# Patient Record
Sex: Male | Born: 1955 | Race: White | Hispanic: No | Marital: Married | Smoking: Former smoker
Health system: Southern US, Community
[De-identification: ages and names within clinical notes are randomized; demographics above are authoritative.]

## PROBLEM LIST (undated history)

## (undated) DIAGNOSIS — B1920 Unspecified viral hepatitis C without hepatic coma: Secondary | ICD-10-CM

## (undated) DIAGNOSIS — F1910 Other psychoactive substance abuse, uncomplicated: Secondary | ICD-10-CM

## (undated) DIAGNOSIS — I4891 Unspecified atrial fibrillation: Secondary | ICD-10-CM

## (undated) DIAGNOSIS — K921 Melena: Secondary | ICD-10-CM

## (undated) DIAGNOSIS — M109 Gout, unspecified: Secondary | ICD-10-CM

## (undated) DIAGNOSIS — M47816 Spondylosis without myelopathy or radiculopathy, lumbar region: Secondary | ICD-10-CM

## (undated) DIAGNOSIS — M130 Polyarthritis, unspecified: Secondary | ICD-10-CM

## (undated) DIAGNOSIS — Z9289 Personal history of other medical treatment: Secondary | ICD-10-CM

## (undated) DIAGNOSIS — J45909 Unspecified asthma, uncomplicated: Secondary | ICD-10-CM

## (undated) HISTORY — DX: Unspecified viral hepatitis C without hepatic coma: B19.20

## (undated) HISTORY — DX: Melena: K92.1

## (undated) HISTORY — DX: Unspecified atrial fibrillation: I48.91

## (undated) HISTORY — DX: Personal history of other medical treatment: Z92.89

## (undated) HISTORY — DX: Other psychoactive substance abuse, uncomplicated: F19.10

## (undated) HISTORY — DX: Spondylosis without myelopathy or radiculopathy, lumbar region: M47.816

## (undated) HISTORY — DX: Unspecified asthma, uncomplicated: J45.909

## (undated) HISTORY — DX: Polyarthritis, unspecified: M13.0

## (undated) NOTE — *Deleted (*Deleted)
Primary Care Physician: Alferd Apa, MD Primary Cardiologist: Dr Jens Som Primary Electrophysiologist: none Referring Physician: [facility] triage    Scott Wagner is a 14 y.o. 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  The patient was initially diagnosed with atrial fibrillation in 2010 and underwent DCCV at that time. He has been maintained on flecainide. Patient is not on anticoagulation with a CHADS2VASC score of 0. ***  Today, he denies symptoms of ***palpitations, chest pain, shortness of breath, orthopnea, PND, lower extremity edema, dizziness, presyncope, syncope, snoring, daytime somnolence, bleeding, or neurologic sequela. The patient is tolerating medications without difficulties and is otherwise without complaint today.    Atrial Fibrillation Risk Factors:  he {Action; does/does not:19097} have symptoms or diagnosis of sleep apnea. he {ACTION; IS/IS [id number]} compliant with CPAP therapy. he {Action; does/does not:19097} have a history of rheumatic fever. he {Action; does/does not:19097} have a history of alcohol use. The patient {Action; does/does not:19097} have a history of early familial atrial fibrillation or other arrhythmias.  he has a BMI of There is no height or weight on file to calculate BMI.. There were no vitals filed for this visit.  Family History  Problem Relation Age of Onset  . Heart failure Other        CHF - father and mother. (mother also had severe RA)     Atrial Fibrillation Management history:  Previous antiarrhythmic drugs: flecainide Previous cardioversions: 2010 Previous ablations: none CHADS2VASC score:  Anticoagulation history: warfarin    Past Medical History:  Diagnosis Date  . ATRIAL FIBRILLATION 06/16/2008   Qualifier: Diagnosis of  By: [name], RMA, [name]    .  Atrial fibrillation (HCC)   . Blood in stool   . Cardiomyopathy    improved   . DJD (degenerative joint disease) of lumbar spine   . Gout   . Hepatitis C   . Hx of echocardiogram    a. Echo 12/13:  EF 60-65%, mod LAE, mild RAE, PASP 34  . Intrinsic asthma, unspecified   . Substance abuse (HCC)   . Unspecified polyarthropathy or polyarthritis, multiple sites    Past Surgical History:  Procedure Laterality Date  . HERNIA REPAIR  1966    Current Outpatient Medications  Medication Sig Dispense Refill  . allopurinol (ZYLOPRIM) 100 MG tablet Take 100 mg by mouth 2 (two) times daily.     Marland Kitchen aspirin 325 MG tablet Take 325 mg by mouth daily.    . flecainide (TAMBOCOR) 100 MG tablet Take 1 tablet (100 mg total) by mouth 2 (two) times daily. Call and schedule follow up office visit to receive further refills. Thank you. [phone] 60 tablet 0  . metoprolol tartrate (LOPRESSOR) 25 MG tablet Take 1 tablet by mouth twice daily 102 tablet 0   No current facility-administered medications for this visit.    Allergies  Allergen Reactions  . Indomethacin Other (See Comments)    Headaches and tired    Social History   Socioeconomic History  . Marital status: Married    Spouse name: Not on file  . Number of children: Not on file  . Years of education: Not on file  . Highest education level: Not on file  Occupational History  . Not on file  Tobacco Use  . Smoking status: Former Smoker    Quit date: 02/02/2014    Years since quitting: 5.8  .  Smokeless tobacco: Never Used  . Tobacco comment: started in 1999. smoked 1/4 ppd   Substance and Sexual Activity  . Alcohol use: No    Alcohol/week: 0.0 standard drinks    Comment: quit in 2006   . Drug use: No    Comment: quit in 2006   . Sexual activity: Not on file  Other Topics Concern  . Not on file  Social History Narrative   Married; step children; disabled.    Social Determinants of Health   Financial Resource Strain:   .  Difficulty of Paying Living Expenses: Not on file  Food Insecurity:   . Worried About Programme researcher, broadcasting/film/video in the Last Year: Not on file  . Ran Out of Food in the Last Year: Not on file  Transportation Needs:   . Lack of Transportation (Medical): Not on file  . Lack of Transportation (Non-Medical): Not on file  Physical Activity:   . Days of Exercise per Week: Not on file  . Minutes of Exercise per Session: Not on file  Stress:   . Feeling of Stress : Not on file  Social Connections:   . Frequency of Communication with Friends and Family: Not on file  . Frequency of Social Gatherings with Friends and Family: Not on file  . Attends Religious Services: Not on file  . Active Member of Clubs or Organizations: Not on file  . Attends Banker Meetings: Not on file  . Marital Status: Not on file  Intimate Partner Violence:   . Fear of Current or Ex-Partner: Not on file  . Emotionally Abused: Not on file  . Physically Abused: Not on file  . Sexually Abused: Not on file     ROS- All systems are reviewed and negative except as per the HPI above.  Physical Exam: There were no vitals filed for this visit.  GEN- The patient is well appearing ***, alert and oriented x 3 today.   Head- normocephalic, atraumatic Eyes-  Sclera clear, conjunctiva pink Ears- hearing intact Oropharynx- clear Neck- supple  Lungs- Clear to ausculation bilaterally, normal work of breathing Heart- ***Regular rate and rhythm, no murmurs, rubs or gallops  GI- soft, NT, ND, + BS Extremities- no clubbing, cyanosis, or edema MS- no significant deformity or atrophy Skin- no rash or lesion Psych- euthymic mood, full affect Neuro- strength and sensation are intact  Wt Readings from Last 3 Encounters:  10/12/18 75.4 kg  04/14/17 80.9 kg  03/04/16 78 kg    EKG today demonstrates ***  Echo 03/18/16 demonstrated  - Left ventricle: The cavity size was normal. Wall thickness was  normal. Systolic  function was vigorous. The estimated ejection  fraction was in the range of 65% to 70%. Wall motion was normal;  there were no regional wall motion abnormalities.  - Mitral valve: There was mild regurgitation.  - Left atrium: The atrium was mildly dilated.  - Right ventricle: The cavity size was mildly dilated. Wall  thickness was normal.  - Right atrium: The atrium was mildly dilated.  - Tricuspid valve: There was mild regurgitation.  - Pulmonary arteries: Systolic pressure was mildly increased.   Epic records are reviewed at length today  CHA2DS2-VASc Score = 0  The patient's score is based upon: CHF History: 0 HTN History: 0 Diabetes History: 0 Stroke History: 0 Vascular Disease History: 0 Age Score: 0 Gender Score: 0  {This patient does not have a recorded CHADS2-VASc score.   Click here to calculate  score.   Then REFRESH your note.       :[id number]}    ASSESSMENT AND PLAN: 1. Paroxysmal Atrial Fibrillation (ICD10:  I48.0) The patient's CHA2DS2-VASc score is 0, indicating a 0.2% annual risk of stroke.   ***dccv, anticoagulation, tik, ablation echo   2. Obesity There is no height or weight on file to calculate BMI. Lifestyle modification was discussed at length including regular exercise and weight reduction. ***   Follow up ***   Jorja Loa PA-C [facility] [address] [place], [place] [address] [phone] 11/23/2019 3:47 PM

---
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
## 2004-08-26 ENCOUNTER — Ambulatory Visit: Payer: Self-pay | Admitting: Pain Medicine

## 2006-04-05 ENCOUNTER — Ambulatory Visit: Payer: Self-pay | Admitting: Cardiovascular Disease

## 2006-04-08 ENCOUNTER — Encounter: Payer: Self-pay | Admitting: Cardiology

## 2006-04-08 ENCOUNTER — Ambulatory Visit: Payer: Self-pay | Admitting: Cardiology

## 2006-04-08 ENCOUNTER — Ambulatory Visit: Payer: Self-pay

## 2006-04-28 ENCOUNTER — Ambulatory Visit: Payer: Self-pay | Admitting: Cardiology

## 2006-05-03 ENCOUNTER — Ambulatory Visit: Payer: Self-pay | Admitting: Cardiology

## 2006-05-03 ENCOUNTER — Inpatient Hospital Stay (HOSPITAL_BASED_OUTPATIENT_CLINIC_OR_DEPARTMENT_OTHER): Admission: RE | Admit: 2006-05-03 | Discharge: 2006-05-03 | Payer: Self-pay | Admitting: Cardiology

## 2006-05-19 ENCOUNTER — Ambulatory Visit: Payer: Self-pay | Admitting: Cardiology

## 2006-05-27 ENCOUNTER — Ambulatory Visit: Payer: Self-pay | Admitting: Cardiology

## 2006-05-28 ENCOUNTER — Ambulatory Visit (HOSPITAL_COMMUNITY): Admission: RE | Admit: 2006-05-28 | Discharge: 2006-05-28 | Payer: Self-pay | Admitting: Cardiology

## 2006-05-28 ENCOUNTER — Encounter: Payer: Self-pay | Admitting: Cardiology

## 2006-07-14 ENCOUNTER — Ambulatory Visit: Payer: Self-pay | Admitting: Cardiology

## 2007-03-10 ENCOUNTER — Ambulatory Visit: Payer: Self-pay | Admitting: Cardiology

## 2007-03-10 LAB — CONVERTED CEMR LAB
BUN: 15 mg/dL (ref 6–23)
CO2: 29 meq/L (ref 19–32)
Calcium: 9.2 mg/dL (ref 8.4–10.5)
Chloride: 106 meq/L (ref 96–112)
Creatinine, Ser: 1 mg/dL (ref 0.4–1.5)
GFR calc Af Amer: 101 mL/min
GFR calc non Af Amer: 84 mL/min
Glucose, Bld: 105 mg/dL — ABNORMAL HIGH (ref 70–99)
Potassium: 3.9 meq/L (ref 3.5–5.1)
Sodium: 141 meq/L (ref 135–145)

## 2007-03-28 ENCOUNTER — Ambulatory Visit: Payer: Self-pay

## 2007-03-28 ENCOUNTER — Encounter: Payer: Self-pay | Admitting: Cardiology

## 2007-08-17 ENCOUNTER — Ambulatory Visit: Payer: Self-pay | Admitting: Cardiology

## 2007-08-17 ENCOUNTER — Inpatient Hospital Stay (HOSPITAL_COMMUNITY): Admission: EM | Admit: 2007-08-17 | Discharge: 2007-08-22 | Payer: Self-pay | Admitting: Emergency Medicine

## 2007-08-18 ENCOUNTER — Encounter: Payer: Self-pay | Admitting: Cardiology

## 2007-08-26 ENCOUNTER — Ambulatory Visit: Payer: Self-pay | Admitting: Cardiology

## 2007-08-29 ENCOUNTER — Ambulatory Visit: Payer: Self-pay | Admitting: Cardiology

## 2007-08-29 LAB — CONVERTED CEMR LAB
INR: 1.7 — ABNORMAL HIGH (ref 0.8–1.0)
Prothrombin Time: 18.8 s — ABNORMAL HIGH (ref 10.9–13.3)

## 2007-09-06 ENCOUNTER — Ambulatory Visit: Payer: Self-pay | Admitting: Cardiology

## 2007-09-15 ENCOUNTER — Ambulatory Visit: Payer: Self-pay | Admitting: Internal Medicine

## 2007-09-22 ENCOUNTER — Ambulatory Visit: Payer: Self-pay | Admitting: Internal Medicine

## 2007-10-06 ENCOUNTER — Ambulatory Visit: Payer: Self-pay | Admitting: Internal Medicine

## 2007-10-06 DIAGNOSIS — I428 Other cardiomyopathies: Secondary | ICD-10-CM | POA: Insufficient documentation

## 2007-10-06 DIAGNOSIS — Z8639 Personal history of other endocrine, nutritional and metabolic disease: Secondary | ICD-10-CM | POA: Insufficient documentation

## 2007-10-06 DIAGNOSIS — Z862 Personal history of diseases of the blood and blood-forming organs and certain disorders involving the immune mechanism: Secondary | ICD-10-CM | POA: Insufficient documentation

## 2007-10-06 DIAGNOSIS — F1910 Other psychoactive substance abuse, uncomplicated: Secondary | ICD-10-CM | POA: Insufficient documentation

## 2007-10-07 ENCOUNTER — Ambulatory Visit: Payer: Self-pay | Admitting: Pulmonary Disease

## 2007-10-07 DIAGNOSIS — M479 Spondylosis, unspecified: Secondary | ICD-10-CM | POA: Insufficient documentation

## 2007-10-07 DIAGNOSIS — B1710 Acute hepatitis C without hepatic coma: Secondary | ICD-10-CM | POA: Insufficient documentation

## 2007-10-07 DIAGNOSIS — R0602 Shortness of breath: Secondary | ICD-10-CM | POA: Insufficient documentation

## 2007-10-31 ENCOUNTER — Ambulatory Visit: Payer: Self-pay | Admitting: Pulmonary Disease

## 2007-11-11 ENCOUNTER — Telehealth (INDEPENDENT_AMBULATORY_CARE_PROVIDER_SITE_OTHER): Payer: Self-pay | Admitting: *Deleted

## 2007-11-21 ENCOUNTER — Encounter (INDEPENDENT_AMBULATORY_CARE_PROVIDER_SITE_OTHER): Payer: Self-pay | Admitting: *Deleted

## 2007-12-07 ENCOUNTER — Ambulatory Visit: Payer: Self-pay | Admitting: Pulmonary Disease

## 2007-12-07 DIAGNOSIS — J45909 Unspecified asthma, uncomplicated: Secondary | ICD-10-CM | POA: Insufficient documentation

## 2007-12-14 LAB — CONVERTED CEMR LAB: A-1 Antitrypsin, Ser: 145 mg/dL (ref 83–200)

## 2007-12-16 ENCOUNTER — Ambulatory Visit: Payer: Self-pay | Admitting: Cardiology

## 2007-12-16 ENCOUNTER — Encounter (INDEPENDENT_AMBULATORY_CARE_PROVIDER_SITE_OTHER): Payer: Self-pay | Admitting: *Deleted

## 2008-01-04 ENCOUNTER — Encounter (INDEPENDENT_AMBULATORY_CARE_PROVIDER_SITE_OTHER): Payer: Self-pay | Admitting: *Deleted

## 2008-06-16 DIAGNOSIS — I4891 Unspecified atrial fibrillation: Secondary | ICD-10-CM

## 2008-06-16 DIAGNOSIS — K921 Melena: Secondary | ICD-10-CM | POA: Insufficient documentation

## 2008-06-16 DIAGNOSIS — M130 Polyarthritis, unspecified: Secondary | ICD-10-CM | POA: Insufficient documentation

## 2008-06-16 HISTORY — DX: Unspecified atrial fibrillation: I48.91

## 2008-07-13 ENCOUNTER — Encounter (INDEPENDENT_AMBULATORY_CARE_PROVIDER_SITE_OTHER): Payer: Self-pay | Admitting: *Deleted

## 2008-09-14 ENCOUNTER — Ambulatory Visit: Payer: Self-pay | Admitting: Cardiovascular Disease

## 2008-09-14 ENCOUNTER — Inpatient Hospital Stay (HOSPITAL_COMMUNITY): Admission: EM | Admit: 2008-09-14 | Discharge: 2008-09-18 | Payer: Self-pay | Admitting: Emergency Medicine

## 2008-09-16 ENCOUNTER — Encounter: Payer: Self-pay | Admitting: Cardiovascular Disease

## 2008-09-19 ENCOUNTER — Ambulatory Visit: Payer: Self-pay | Admitting: Cardiology

## 2008-09-19 ENCOUNTER — Ambulatory Visit: Payer: Self-pay

## 2008-09-20 ENCOUNTER — Encounter: Payer: Self-pay | Admitting: Cardiology

## 2008-09-20 LAB — CONVERTED CEMR LAB: INR: 1.3

## 2008-09-24 ENCOUNTER — Ambulatory Visit: Payer: Self-pay | Admitting: Cardiovascular Disease

## 2008-09-24 LAB — CONVERTED CEMR LAB
INR: 1.3 — ABNORMAL HIGH (ref 0.8–1.0)
POC INR: 2.7
Prothrombin Time: 13.7 s — ABNORMAL HIGH (ref 9.1–11.7)

## 2008-10-01 ENCOUNTER — Ambulatory Visit: Payer: Self-pay | Admitting: Cardiovascular Disease

## 2008-10-01 LAB — CONVERTED CEMR LAB: POC INR: 2.3

## 2008-10-19 ENCOUNTER — Ambulatory Visit: Payer: Self-pay | Admitting: Cardiology

## 2008-10-19 DIAGNOSIS — F17200 Nicotine dependence, unspecified, uncomplicated: Secondary | ICD-10-CM | POA: Insufficient documentation

## 2008-10-19 DIAGNOSIS — I498 Other specified cardiac arrhythmias: Secondary | ICD-10-CM | POA: Insufficient documentation

## 2008-10-22 ENCOUNTER — Telehealth (INDEPENDENT_AMBULATORY_CARE_PROVIDER_SITE_OTHER): Payer: Self-pay | Admitting: *Deleted

## 2008-10-29 ENCOUNTER — Encounter: Payer: Self-pay | Admitting: Cardiology

## 2008-12-27 ENCOUNTER — Emergency Department (HOSPITAL_COMMUNITY): Admission: EM | Admit: 2008-12-27 | Discharge: 2008-12-27 | Payer: Self-pay | Admitting: Emergency Medicine

## 2008-12-28 ENCOUNTER — Telehealth: Payer: Self-pay | Admitting: Cardiology

## 2009-02-15 ENCOUNTER — Encounter (INDEPENDENT_AMBULATORY_CARE_PROVIDER_SITE_OTHER): Payer: Self-pay | Admitting: *Deleted

## 2009-06-20 ENCOUNTER — Ambulatory Visit: Payer: Self-pay | Admitting: Cardiology

## 2009-06-20 DIAGNOSIS — N259 Disorder resulting from impaired renal tubular function, unspecified: Secondary | ICD-10-CM | POA: Insufficient documentation

## 2009-07-19 ENCOUNTER — Encounter (INDEPENDENT_AMBULATORY_CARE_PROVIDER_SITE_OTHER): Payer: Self-pay | Admitting: *Deleted

## 2009-07-19 LAB — CONVERTED CEMR LAB
BUN: 20 mg/dL (ref 6–23)
CO2: 24 meq/L (ref 19–32)
Calcium: 9.3 mg/dL (ref 8.4–10.5)
Chloride: 106 meq/L (ref 96–112)
Creatinine, Ser: 1.2 mg/dL (ref 0.4–1.5)
GFR calc non Af Amer: 69.69 mL/min (ref >60)
Glucose, Bld: 85 mg/dL (ref 70–99)
Potassium: 4.3 meq/L (ref 3.5–5.1)
Sodium: 142 meq/L (ref 135–145)

## 2009-10-20 ENCOUNTER — Emergency Department (HOSPITAL_COMMUNITY): Admission: EM | Admit: 2009-10-20 | Discharge: 2009-10-20 | Payer: Self-pay | Admitting: Family Medicine

## 2010-03-04 NOTE — Letter (Signed)
Summary: Appointment - Reminder 2  [employer], Main Office  [address]   [place], [place] [address]   Phone: [phone]  Fax: [phone]     February 15, 2009 MRN: [id number]   ORESTE MAJEED [address] [place], [place]  [address]   Dear Mr. ROESLER,  Our records indicate that it is time to schedule a follow-up appointment. Dr.Crenshaw recommended that you follow up with [place] in March,2011. It is very important that we reach you to schedule this appointment. We look forward to participating in your health care needs. Please contact us at the number listed above at your earliest convenience to schedule your appointment.  If you are unable to make an appointment at this time, give [place] a call so we can update our records.     Sincerely,   Glass blower/designer

## 2010-03-04 NOTE — Letter (Signed)
Summary: Generic Letter  [facility] [employer], Main Office  [address]   [place], [place] [address]   Phone: [phone]  Fax: [phone]        July 19, 2009 MRN: [id number]    Scott Wagner [address] [place], [place]  [address]    Dear [name],     Just wanted to let you know the blood work we checked was normal. We checked your sodium, potassium and kidney function and they were all normal. Please call with any questions or concerns.   Sincerely,  Deliah Goody, RN/Dr Olga Millers

## 2010-03-04 NOTE — Assessment & Plan Note (Signed)
Summary: F6M/DM   Referring Provider:  Olga Millers   History of Present Illness: Scott Wagner is a pleasant gentleman who has a history of atrial fibrillation.  He had a previous nonischemic cardiomyopathy that improved with his followup echocardiogram. Prior catheterization on March 31 of 2008 showed normal coronary arteries and an ejection fraction of 50%. Last episode of atrial fibrillation occurred in August 2010.  Echocardiogram on September 16, 2008 showed an ejection fraction of 50-55% with mild left atrial enlargement. A TSH was normal. He ultimately underwent cardioversion successfully to sinus rhythm. He was also placed on flecainide. He had an outpatient exercise treadmill that showed no exercise-induced arrhythmias. I last saw him in Sept 2010. Since then, she is here had an episode of atrial fibrillation over Thanksgiving. He was seen in the emergency room and given an additional dose of flecainide by his report and converted to sinus rhythm. Since then he denies any dyspnea, chest pain, palpitations, syncope.   Current Medications (verified): 1)  Metoprolol Succinate 25 Mg Xr24h-Tab (Metoprolol Succinate) .... Take One Half Tablet By Mouth Daily 2)  Flecainide Acetate 100 Mg Tabs (Flecainide Acetate) .Marland Kitchen.. 1 Tab Two Times A Day 3)  Aspir-Low 81 Mg Tbec (Aspirin) .Marland Kitchen.. 1 Tab Daily  Allergies: No Known Drug Allergies  Past History:  Past Medical History: ATRIAL FIBRILLATION (ICD-427.31) HEMATOCHEZIA (ICD-578.1) POLYARTHRITIS, INFLAMMATORY (ICD-716.59) INTRINSIC ASTHMA, UNSPECIFIED (ICD-493.10) DEGENERATIVE JOINT DISEASE, LUMBAR SPINE (ICD-721.90) HEPATITIS C (ICD-070.51) SUBSTANCE ABUSE (ICD-305.90) CARDIOMYOPATHY (ICD-425.4) improved    Past Surgical History: Reviewed history from 06/16/2008 and no changes required. hernia - 1966  Social History: Reviewed history from 10/07/2007 and no changes required. Current smoker.  Started in 1999.  Smoked 1/4 ppd Married Step  children Disabled Quit ETOH and drugs in 2006  Review of Systems       no fevers or chills, productive cough, hemoptysis, dysphasia, odynophagia, melena, hematochezia, dysuria, hematuria, rash, seizure activity, orthopnea, PND, pedal edema, claudication. Remaining systems are negative.   Vital Signs:  Patient profile:   55 year old male Height:      74 inches Weight:      187 pounds BMI:     24.10 Pulse rate:   54 / minute Resp:     14 per minute BP sitting:   104 / 64  (left arm)  Vitals Entered By: Kem Parkinson (Jun 20, 2009 2:34 PM)  Physical Exam  General:  Well-developed well-nourished in no acute distress.  Skin is warm and dry.  HEENT is normal.  Neck is supple. No thyromegaly.  Chest is clear to auscultation with normal expansion.  Cardiovascular exam is regular rate and rhythm.  Abdominal exam nontender or distended. No masses palpated. Extremities show no edema. neuro grossly intact    EKG  Procedure date:  06/20/2009  Findings:      Sinus rhythm at a rate of 60. Occasional PVC. No ST changes.  Impression & Recommendations:  Problem # 1:  ATRIAL FIBRILLATION (ICD-427.31) Patient remains in sinus rhythm today. Continue aspirin as he has no embolic risk factors. Continue flecainide 100 mg p.o. b.i.d. Continue low-dose Toprol. His updated medication list for this problem includes:    Metoprolol Succinate 25 Mg Xr24h-tab (Metoprolol succinate) .Marland Kitchen... Take one half tablet by mouth daily    Flecainide Acetate 100 Mg Tabs (Flecainide acetate) .Marland Kitchen... 1 tab two times a day    Aspir-low 81 Mg Tbec (Aspirin) .Marland Kitchen... 1 tab daily  Problem # 2:  RENAL INSUFFICIENCY (ICD-588.9) Creatinine increased in  November. Repeat bmet today.  Problem # 3:  TOBACCO ABUSE (ICD-305.1) Patient counseled on discontinuing.  Problem # 4:  CARDIOMYOPATHY (ICD-425.4) Improved almost recent echocardiogram and felt to be tachycardia mediated. I will repeat echocardiogram in one year  when he returns. His updated medication list for this problem includes:    Metoprolol Succinate 25 Mg Xr24h-tab (Metoprolol succinate) .Marland Kitchen... Take one half tablet by mouth daily    Flecainide Acetate 100 Mg Tabs (Flecainide acetate) .Marland Kitchen... 1 tab two times a day    Aspir-low 81 Mg Tbec (Aspirin) .Marland Kitchen... 1 tab daily  Other Orders: TLB-BMP (Basic Metabolic Panel-BMET) (80048-METABOL)  Patient Instructions: 1)  Your physician recommends that you schedule a follow-up appointment in: ONE YEAR

## 2010-05-07 LAB — CBC
HCT: 42.2 % (ref 39.0–52.0)
Hemoglobin: 14.7 g/dL (ref 13.0–17.0)
MCHC: 34.8 g/dL (ref 30.0–36.0)
MCV: 88.5 fL (ref 78.0–100.0)
Platelets: 232 10*3/uL (ref 150–400)
RBC: 4.77 MIL/uL (ref 4.22–5.81)
RDW: 14.4 % (ref 11.5–15.5)
WBC: 9 10*3/uL (ref 4.0–10.5)

## 2010-05-07 LAB — BASIC METABOLIC PANEL
BUN: 13 mg/dL (ref 6–23)
CO2: 24 mEq/L (ref 19–32)
Calcium: 8.9 mg/dL (ref 8.4–10.5)
Chloride: 108 mEq/L (ref 96–112)
Creatinine, Ser: 1.72 mg/dL — ABNORMAL HIGH (ref 0.4–1.5)
GFR calc Af Amer: 51 mL/min — ABNORMAL LOW (ref >60)
GFR calc non Af Amer: 42 mL/min — ABNORMAL LOW (ref >60)
Glucose, Bld: 125 mg/dL — ABNORMAL HIGH (ref 70–99)
Potassium: 3.7 mEq/L (ref 3.5–5.1)
Sodium: 141 mEq/L (ref 135–145)

## 2010-05-07 LAB — CK TOTAL AND CKMB (NOT AT ARMC)
CK, MB: 1.9 ng/mL (ref 0.3–4.0)
Relative Index: INVALID (ref 0.0–2.5)
Total CK: 77 U/L (ref 7–232)

## 2010-05-07 LAB — TROPONIN I: Troponin I: 0.02 ng/mL (ref 0.00–0.06)

## 2010-05-10 LAB — CBC
HCT: 34.4 % — ABNORMAL LOW (ref 39.0–52.0)
HCT: 35.8 % — ABNORMAL LOW (ref 39.0–52.0)
HCT: 41.1 % (ref 39.0–52.0)
HCT: 42.9 % (ref 39.0–52.0)
HCT: 45.9 % (ref 39.0–52.0)
Hemoglobin: 11.8 g/dL — ABNORMAL LOW (ref 13.0–17.0)
Hemoglobin: 12.3 g/dL — ABNORMAL LOW (ref 13.0–17.0)
Hemoglobin: 14 g/dL (ref 13.0–17.0)
Hemoglobin: 14.8 g/dL (ref 13.0–17.0)
Hemoglobin: 15.9 g/dL (ref 13.0–17.0)
MCHC: 34.1 g/dL (ref 30.0–36.0)
MCHC: 34.3 g/dL (ref 30.0–36.0)
MCHC: 34.4 g/dL (ref 30.0–36.0)
MCHC: 34.4 g/dL (ref 30.0–36.0)
MCHC: 34.6 g/dL (ref 30.0–36.0)
MCV: 88.1 fL (ref 78.0–100.0)
MCV: 88.7 fL (ref 78.0–100.0)
MCV: 89 fL (ref 78.0–100.0)
MCV: 87.7 fL (ref 78.0–100.0)
MCV: 88.1 fL (ref 78.0–100.0)
Platelets: 171 10*3/uL (ref 150–400)
Platelets: 183 10*3/uL (ref 150–400)
Platelets: 227 10*3/uL (ref 150–400)
Platelets: 229 10*3/uL (ref 150–400)
Platelets: 262 10*3/uL (ref 150–400)
RBC: 3.91 MIL/uL — ABNORMAL LOW (ref 4.22–5.81)
RBC: 4.02 MIL/uL — ABNORMAL LOW (ref 4.22–5.81)
RBC: 4.64 MIL/uL (ref 4.22–5.81)
RBC: 4.89 MIL/uL (ref 4.22–5.81)
RBC: 5.21 MIL/uL (ref 4.22–5.81)
RDW: 13.3 % (ref 11.5–15.5)
RDW: 13.4 % (ref 11.5–15.5)
RDW: 13.7 % (ref 11.5–15.5)
RDW: 13.2 % (ref 11.5–15.5)
RDW: 13.4 % (ref 11.5–15.5)
WBC: 5.6 10*3/uL (ref 4.0–10.5)
WBC: 6.3 10*3/uL (ref 4.0–10.5)
WBC: 7.6 10*3/uL (ref 4.0–10.5)
WBC: 10 10*3/uL (ref 4.0–10.5)
WBC: 8.2 10*3/uL (ref 4.0–10.5)

## 2010-05-10 LAB — CARDIAC PANEL(CRET KIN+CKTOT+MB+TROPI)
CK, MB: 1.9 ng/mL (ref 0.3–4.0)
Relative Index: INVALID (ref 0.0–2.5)
Total CK: 62 U/L (ref 7–232)
Troponin I: 0.11 ng/mL — ABNORMAL HIGH (ref 0.00–0.06)

## 2010-05-10 LAB — DIFFERENTIAL
Basophils Absolute: 0.2 10*3/uL — ABNORMAL HIGH (ref 0.0–0.1)
Basophils Relative: 2 % — ABNORMAL HIGH (ref 0–1)
Eosinophils Absolute: 0.3 10*3/uL (ref 0.0–0.7)
Eosinophils Relative: 4 % (ref 0–5)
Lymphocytes Relative: 27 % (ref 12–46)
Lymphs Abs: 2.7 10*3/uL (ref 0.7–4.0)
Monocytes Absolute: 0.7 10*3/uL (ref 0.1–1.0)
Monocytes Relative: 7 % (ref 3–12)
Neutro Abs: 6.1 10*3/uL (ref 1.7–7.7)
Neutrophils Relative %: 61 % (ref 43–77)

## 2010-05-10 LAB — POCT CARDIAC MARKERS
CKMB, poc: <1 ng/mL — ABNORMAL LOW (ref 1.0–8.0)
CKMB, poc: <1 ng/mL — ABNORMAL LOW (ref 1.0–8.0)
Myoglobin, poc: 74.2 ng/mL (ref 12–200)
Myoglobin, poc: 88.7 ng/mL (ref 12–200)
Troponin i, poc: <0.05 ng/mL (ref 0.00–0.09)
Troponin i, poc: <0.05 ng/mL (ref 0.00–0.09)

## 2010-05-10 LAB — LIPID PANEL
Cholesterol: 170 mg/dL (ref 0–200)
HDL: 38 mg/dL — ABNORMAL LOW (ref >39)
LDL Cholesterol: 117 mg/dL — ABNORMAL HIGH (ref 0–99)
Total CHOL/HDL Ratio: 4.5 RATIO
Triglycerides: 77 mg/dL (ref <150)
VLDL: 15 mg/dL (ref 0–40)

## 2010-05-10 LAB — URINALYSIS, ROUTINE W REFLEX MICROSCOPIC
Bilirubin Urine: NEGATIVE
Glucose, UA: NEGATIVE mg/dL
Hgb urine dipstick: NEGATIVE
Ketones, ur: NEGATIVE mg/dL
Nitrite: NEGATIVE
Protein, ur: NEGATIVE mg/dL
Specific Gravity, Urine: 1.018 (ref 1.005–1.030)
Urobilinogen, UA: 1 mg/dL (ref 0.0–1.0)
pH: 6 (ref 5.0–8.0)

## 2010-05-10 LAB — URINE MICROSCOPIC-ADD ON

## 2010-05-10 LAB — CK TOTAL AND CKMB (NOT AT ARMC)
CK, MB: 2.3 ng/mL (ref 0.3–4.0)
Relative Index: INVALID (ref 0.0–2.5)
Total CK: 82 U/L (ref 7–232)

## 2010-05-10 LAB — COMPREHENSIVE METABOLIC PANEL
ALT: 25 U/L (ref 0–53)
AST: 24 U/L (ref 0–37)
Albumin: 3.7 g/dL (ref 3.5–5.2)
Alkaline Phosphatase: 64 U/L (ref 39–117)
BUN: 10 mg/dL (ref 6–23)
CO2: 24 mEq/L (ref 19–32)
Calcium: 9.1 mg/dL (ref 8.4–10.5)
Chloride: 109 mEq/L (ref 96–112)
Creatinine, Ser: 1.11 mg/dL (ref 0.4–1.5)
GFR calc Af Amer: >60 mL/min (ref >60)
GFR calc non Af Amer: >60 mL/min (ref >60)
Glucose, Bld: 111 mg/dL — ABNORMAL HIGH (ref 70–99)
Potassium: 4.2 mEq/L (ref 3.5–5.1)
Sodium: 140 mEq/L (ref 135–145)
Total Bilirubin: 1 mg/dL (ref 0.3–1.2)
Total Protein: 7.2 g/dL (ref 6.0–8.3)

## 2010-05-10 LAB — PROTIME-INR
INR: 1.1 (ref 0.00–1.49)
INR: 1 (ref 0.00–1.49)
Prothrombin Time: 13.7 seconds (ref 11.6–15.2)
Prothrombin Time: 13.4 seconds (ref 11.6–15.2)

## 2010-05-10 LAB — APTT: aPTT: 33 seconds (ref 24–37)

## 2010-05-10 LAB — RAPID URINE DRUG SCREEN, HOSP PERFORMED
Amphetamines: NOT DETECTED
Barbiturates: NOT DETECTED
Benzodiazepines: NOT DETECTED
Cocaine: NOT DETECTED
Opiates: NOT DETECTED
Tetrahydrocannabinol: NOT DETECTED

## 2010-05-10 LAB — TROPONIN I: Troponin I: 0.05 ng/mL (ref 0.00–0.06)

## 2010-05-10 LAB — TSH: TSH: 1.698 u[IU]/mL (ref 0.350–4.500)

## 2010-06-17 NOTE — Assessment & Plan Note (Signed)
[facility]                            CARDIOLOGY OFFICE NOTE   [name], [name]                   MRN:          [id number]  DATE:09/06/2007                            DOB:          12-27-1955    HISTORY OF PRESENT ILLNESS:  [name] returns for followup today.  He has a history of nonischemic cardiomyopathy with atrial fibrillation.  However, his most recent echocardiogram in February 2009 showed that his  LV function had returned to normal.  However, he was recently admitted  to [facility] again in July with recurrent atrial fibrillation.  A TSH was normal.  He did have a repeat echocardiogram on August 18, 2007.  His ejection fraction was felt to be 55%.  He ultimately underwent TEE-  guided cardioversion successfully.  Since then, he has done well.  He  denies any dyspnea, chest pain, palpitations, or syncope.  There is no  pedal edema.   MEDICATIONS:  1. Metoprolol 50 mg p.o. b.i.d.  2. Coumadin as directed.   PHYSICAL EXAMINATION:  VITAL SIGNS:  Shows a blood pressure of 130/80,  and his pulse is 44.  He weighs 190 pounds.  HEENT:  Normal.  NECK:  Supple.  CHEST:  Clear.  CARDIOVASCULAR:  Reveals bradycardic rate, but a regular rhythm.  ABDOMEN:  Shows no tenderness.  EXTREMITIES:  Show no edema.   His electrocardiogram shows a sinus bradycardia at rate of 44.  There  are no ST changes noted.   DIAGNOSES:  1. Paroxysmal atrial fibrillation - the patient remains in sinus      rhythm following his cardioversion.  Note his digoxin was      discontinued in the hospital, as he was bradycardic and his beta-      blocker was also decreased.  He continues to be somewhat      bradycardic, but he is asymptomatic with this.  We will therefore      make no changes in the dose of his Lopressor for now.  If he has      recurrent atrial fibrillation in the future then he may require an      antiarrhythmic such as flecainide.  He  will continue on his      Coumadin for now.  He does have a history of noncompliance, but he      is now following up in our Coumadin Clinic to have his INRs      checked.  We will most likely discontinue this in the future and      resume his aspirin as I am concerned about his long term      compliance, but given his recent cardioversion.  We will continue      with Coumadin for now.  2. History of nonischemic cardiomyopathy - we felt this was possibly      secondary to ethyl alcohol (consumption, dependency) versus viral      mediated cardiomyopathy.  It also could be tachycardia mediated.      It is improved on his most recent echocardiogram.  3. History of substance abuse.  I will see him back in 3 months.     Madolyn Frieze Jens Som, MD, [facility]  Electronically Signed    BSC/MedQ  DD: 09/06/2007  DT: 09/06/2007  Job #: [id number]   cc:   Gregary Signs A. [name], MD

## 2010-06-17 NOTE — Op Note (Signed)
NAME:  Scott Wagner, BLANN NO.:  [id number]   MEDICAL RECORD NO.:  [id number]          PATIENT TYPE:  INP   LOCATION:  3710                         FACILITY:  [facility]   PHYSICIAN:  Madolyn Frieze. Jens Som, MD, FACCDATE OF BIRTH:  Aug 23, 1955   DATE OF PROCEDURE:  09/17/2008  DATE OF DISCHARGE:                               OPERATIVE REPORT   This is cardioversion of atrial flutter.  The patient was sedated with  propofol 140 mg intravenously by Anesthesia.  Synchronized cardioversion  with 120 J (biphasic) resulted in normal sinus rhythm.  There were no  immediate complications.  We would recommend continuing Lovenox until  INR is therapeutic.  He would then continue Coumadin for 4 weeks  following the procedure.      Madolyn Frieze Jens Som, MD, [facility]  Electronically Signed     BSC/MEDQ  D:  09/17/2008  T:  09/18/2008  Job:  [id number]

## 2010-06-17 NOTE — Discharge Summary (Signed)
NAME:  Scott Wagner, Scott Wagner NO.:  [id number]   MEDICAL RECORD NO.:  [id number]          PATIENT TYPE:  INP   LOCATION:  2020                         FACILITY:  [facility]   PHYSICIAN:  Madolyn Frieze. Jens Som, MD, FACCDATE OF BIRTH:  [birth year]   DATE OF ADMISSION:  08/17/2007  DATE OF DISCHARGE:  08/22/2007                               DISCHARGE SUMMARY   PROCEDURE:  TEE cardioversion.   PRIMARY FINAL DISCHARGE DIAGNOSIS:  Paroxysmal atrial fibrillation.   SECONDARY DIAGNOSES:  1. Nonischemic cardiomyopathy with an ejection fraction of 50%-55% at      transesophageal echocardiography this admission.  2. Status post direct current cardioversion for atrial fibrillation in      April 2008.  3. History of osteoarthritis.  4. Status post cardiac catheterization in March 2008 with normal      coronary arteries.  5. Ongoing tobacco use.   TIME AT DISCHARGE:  35 minutes.   HOSPITAL COURSE:  [name] is a 55 year old male with known history  of coronary artery disease, who has had atrial fibrillation in the past.  He developed palpitations that had started several days prior to  admission.  He came in with an atrial fibrillation.  He was admitted for  further evaluation.   [name] was symptomatic with his atrial fibrillation despite rate  control.  He was scheduled for a TEE cardioversion and this was  performed on August 19, 2007.  His EF was 50%-55%.  He had mild-to-  moderate biatrial enlargement and no left atrial appendage thrombus.  He  had 200 joules biphasic shock and converted to sinus rhythm.   [name] was continued on heparin until his Coumadin level was  therapeutic.  He was therapeutic on August 21, 2007, and remained  therapeutic on August 22, 2007.  His INR at discharge was 2.2.  Once he  had been therapeutic for greater than 24 hours, he was felt appropriate  for discontinuation of heparin.   His heart rate dropped to the 40s at times on metoprolol 75  mg b.i.d.  and digoxin.  The digoxin was discontinued and the metoprolol was  decreased to 50 mg a day.  On August 22, 2007, Dr. Jens Som evaluated Mr.  Raygoza and felt that he was stable for discharge with close outpatient  followup.   DISCHARGE INSTRUCTIONS:  His activity level is to be increased  gradually.  He is to stick to a heart-healthy diet.  He is to follow up  in our office with the Coumadin check on August 26, 2007, at 10:30 and see  Dr. Jens Som on September 06, 2007, at 8:00 a.m.  He is encouraged to obtain  a primary care physician.   DISCHARGE MEDICATIONS:  1. Coumadin 5 mg daily or as directed.  2. Metoprolol 50 mg b.i.d.  3. Tylenol p.r.n.  4. He is not to take digoxin.  5. He is to limit Advil or other NSAIDs.      Theodore Demark, PA-C      Madolyn Frieze. Jens Som, MD, [facility]  Electronically Signed    RB/MEDQ  D:  08/22/2007  T:  08/23/2007  Job:  [id number]

## 2010-06-17 NOTE — H&P (Signed)
NAME:  ZURICH, CARRENO NO.:  [id number]   MEDICAL RECORD NO.:  [id number]          PATIENT TYPE:  INP   LOCATION:  2020                         FACILITY:  [facility]   PHYSICIAN:  Luis Abed, MD, FACCDATE OF BIRTH:  05/15/1955   DATE OF ADMISSION:  08/17/2007  DATE OF DISCHARGE:                              HISTORY & PHYSICAL   PRIMARY CARE PHYSICIAN:  Dr. Dalbert Mayotte, MD in [employer].   PRIMARY CARDIOLOGIST:  Veverly Fells. Excell Seltzer, MD   CHIEF COMPLAINT:  Palpitations.   HISTORY OF PRESENT ILLNESS:  Mr. Laymon is a 55 year old male with a  history of atrial fibrillation but no coronary artery disease.  He had  onset of tachy palpitations approximately 3 days ago, Saturday.  He has  had no chest pain except for 1 or 2 brief stabbing-type pains.  He has  had some bilateral neck pain and a slight headache as well as fatigue  and dyspnea on exertion.  He also complains of episodic shortness of  breath for 1 month.  He states that it starts at rest in his lungs feel  like they are filled with fluids.  He will lie down and his symptoms  will last a few hours.  He feels that he does not cough significantly  when he gets these.  He has been out of Lasix 2 or 3 months but states  these symptoms started before he ran out of his Lasix.  He has not had  any in the last week or so.  Currently, in the emergency room, he is  resting comfortably.   PAST MEDICAL HISTORY:  1. Status post cardiac catheterization in March 2008 showing normal      coronary arteries and an EF of 50%.  2. History of nonischemic cardiomyopathy, felt secondary to EtOH with      an EF in March 2008, 40%.  3. History of paroxysmal atrial fibrillation, status post direct      current cardioversion.  4. History of anticoagulation with Coumadin, discontinued because of      lack of followup and Coumadin checks.  5. Osteoarthritis.   SURGICAL HISTORY:  Status post direct current cardioversion with a  TEE  as well as heart catheterization.   ALLERGIES:  No known drug allergies.   CURRENT MEDICATIONS:  1. Aspirin 325 mg daily.  2. Digoxin 0.25 mg daily.  3. Metoprolol 5 mg b.i.d.   SOCIAL HISTORY:  He lives in [place] with his wife.  He is a disabled  Engineer, maintenance (IT).  He has approximately 5-pack-year history of tobacco  use.  He smokes about 2 cigarettes a day.  Up until the palpitations  started, he was walking between 2 and 5 miles a day.  He quit alcohol  and drugs about 2 years ago.   FAMILY HISTORY:  Both of his parents are deceased; his mother had atrial  fibrillation, and his father had heart failure but no siblings have  coronary artery disease or arrhythmia problems.   REVIEW OF SYSTEMS:  He has remote history of erectile dysfunction.  He  has some dyspnea on exertion  that is chronic.  The palpitations are  described above.  He denies orthopnea, PND, or edema.  He has not had  urinary symptoms.  He has had some fatigue.  He has some chronic  arthralgias and back pain.  Full 14-point review of systems is otherwise  negative.   PHYSICAL EXAMINATION:  VITAL SIGNS:  Temperature is 98.4, blood pressure  131/85, pulse 80, and respiratory rate 20.  GENERAL:  He is a well-developed, well-nourished white male in no acute  distress.  HEENT:  Normal.  NECK:  Normal.  There is no lymphadenopathy, thyromegaly, bruit, or JVD  noted.  CARDIOVASCULAR:  Irregular in rate and rhythm with an S1, S2, and a soft  systolic murmur noted.  LUNGS:  Essentially clear to auscultation bilaterally.  SKIN:  No rashes or lesions are noted.  ABDOMEN:  Soft and nontender with active bowel sounds and no  hepatosplenomegaly is noted.  EXTREMITIES:  Distal pulses are 2+ in all four extremities.  There is no  cyanosis, clubbing or edema noted.  MUSCULOSKELETAL:  There is no joint deformity or effusions.  No spine or  CVA tenderness.  NEUROLOGIC:  He is alert and oriented.  Cranial nerves  II-XII grossly  intact.   Chest x-ray shows a right upper lobe density, question granuloma, chest  CT recommended.   EKG:  AFib, 85 with a occasional PVCs and no acute ischemic changes.   Laboratory values are pending at the time of dictation.   IMPRESSION:  Mr. Herrington was seen today by Dr. Myrtis Ser.  He is symptomatic  from his atrial fibrillation despite adequate rate control.  He will be  admitted.  He will be started on heparin with a plan to transition him  to Coumadin.  He will be continued on his other home medications.  In  discussing the options with the patient, he prefers to get out of atrial  fibrillation as soon as possible.  Therefore, he will be made n.p.o.  after midnight, and we will try to arrange a TEE cardioversion in the  a.m.  Mr. Dolberry understands that he will have to be compliant with  his Coumadin and keep followup appointment to get it checked.  Case  manager will be asked to see him to help with financial arrangements as  he is extremely worried about these and to see if outpatient Lovenox as  a possibility.  Because of his history of nonischemic cardiomyopathy, we  will check an echocardiogram, but because he had a heart catheterization  a little over a year ago and has not had significant chest pain, no  ischemic workup is planned.      Theodore Demark, PA-C      Luis Abed, MD, [facility]  Electronically Signed    RB/MEDQ  D:  08/17/2007  T:  08/18/2007  Job:  [id number]   cc:   Dalbert Mayotte, M.D.

## 2010-06-17 NOTE — Assessment & Plan Note (Signed)
[facility]                            CARDIOLOGY OFFICE NOTE   PER, BEAGLEY                     MRN:          [id number]  DATE:07/14/2006                            DOB:          [birth year]    Mr. Laster returns for followup today. He has a history of atrial  fibrillation as well as non ischemic cardiomyopathy. Please refer to my  previous notes for details. He did undergo cardiac catheterization on  May 03, 2006. He had no significant coronary disease and his ejection  fraction was felt to be 50%. He subsequently underwent cardioversion on  May 28, 2006 following a transesophageal echocardiogram that showed no  thrombus. Note his ejection fraction at that time was 45%. There was  mild-to-moderate mitral regurgitation. Since being cardioverted he has  felt well. He denies any dyspnea, chest pain, pedal edema, or syncope.  He occasionally has minimal palpitations but nothing sustained. His  medications at present include;  1. Coumadin 5 mg p.o. daily.  2. Lopressor 150 mg p.o. b.i.d.  3. Digoxin 0.25 mg p.o. daily.  4. Lisinopril 5 mg p.o. daily.  5. Lasix 20 mg p.o. daily.   His physical exam today shows blood pressure of 116/69, and his pulse is  55. He weighs 117 pounds. He is well-developed and well-nourished.  HEENT: Normal.  NECK: Supple.  CHEST: Clear.  CARDIOVASCULAR EXAM: Reveals a regular rate and rhythm.  EXTREMITIES: Show no edema.   His electrocardiogram shows a sinus rhythm at a rate of 62. The axis is  normal. There are no significant ST-T changes.   DIAGNOSES:  1. Atrial fibrillation- the patient is status post cardioversion and      is maintaining sinus rhythm. We will continue with his beta blocker      and digoxin for rate control if his atrial fibrillation recurs. He      apparently has not had his Coumadin checked by Dr. Drue Second since      April 24th. I did discuss this with her today and he has had  multiple phone calls explaining that he needed to have his INR      checked. Given his history of noncompliance and question of drug      use, we feel that it is too high risk to continue with his      Coumadin. We feel the risk outweighs the benefit. We therefore      would like to discontinue his Coumadin. I have explained that he is      at a higher risk of an embolic event if his atrial fibrillation      recurs. Instead we will begin enteric coated aspirin 325 mg p.o.      daily. If he demonstrates better compliance in the future then we      could reconsider this.  2. Non ischemic cardiomyopathy- the etiology of this remained unclear.      It may have been tachycardia-mediated related to his previous      atrial fibrillation versus his history of ETOH. He also had a URI  back in the winter and it may have been virally mediated.      Regardless we will continue with his lisinopril, digoxin, and      Lasix. Note we will discontinue his Lopressor and begin Toprol 200      mg p.o. daily. I will check a BMET today to follow his potassium      and renal function.  3. History of mildly increased liver functions- this may be related to      his history of alcohol abuse. We will plan to repeat those and if      they remain elevated he may need a gastrointestinal evaluation.  4. History of substance abuse- the patient states that he is not using      any substances at this point.  5. Coumadin therapy- we will discontinue this as outlined above.   We will see him back in 4 months.     Madolyn Frieze Jens Som, MD, [facility]  Electronically Signed    BSC/MedQ  DD: 07/14/2006  DT: 07/14/2006  Job #: [id number]   cc:   Dalbert Mayotte, M.D.

## 2010-06-17 NOTE — Assessment & Plan Note (Signed)
[facility]                            CARDIOLOGY OFFICE NOTE   Scott Wagner, Scott Wagner                   MRN:          [id number]  DATE:12/16/2007                            DOB:          06/29/1955    Scott Wagner is a pleasant gentleman who has a history of atrial  fibrillation.  He had a previous nonischemic cardiomyopathy that  improved with his followup echocardiogram.  His most recent missions to  [facility] was in July and he had recurrent atrial fibrillation  at that time.  TSH was normal.  An echocardiogram on August 18, 2007,  showed an ejection fraction of 55% and he ultimately had TE guided  cardioversion successfully.  I last saw him on September 06, 2007.  Since  then he has been diagnosed with asthma.  However, he denies any dyspnea  on exertion, orthopnea, PND, pedal edema, presyncope, syncope,  exertional chest pain.  He occasionally feels his heart skip but this  is not sustained.  He did discontinue his Coumadin as he apparently was  having hematochezia.  He has been seen by gastroenterologist in  [place] and a colonoscopy was recommended, but he states he is  unable to afford this.   His medications include metoprolol 50 mg p.o. b.i.d. and aspirin 325 mg  daily.  He also takes Advair.   PHYSICAL EXAMINATION:  VITAL SIGNS:  Today shows a blood pressure 138/85  and his pulse is 48.  He weighs 197 pounds.  HEENT:  Normal.  NECK:  Supple.  CHEST:  Clear.  CARDIOVASCULAR:  Bradycardic rate, but regular rhythm.  ABDOMINAL:  No tenderness.  EXTREMITIES:  No edema.   Electrocardiogram shows a sinus rhythm at a rate of 51.  There are no  significant ST changes.   DIAGNOSES:  1. Paroxysmal atrial fibrillation - the patient remains in sinus      rhythm today.  We will continue with his Lopressor and his aspirin.      If he develops recurrent atrial fibrillation in the future, then he      may require an antiarrhythmic such  as flecainide.  Note, previous      catheterization showed no coronary artery disease.  We will      continue off of his Coumadin for now particularly in light of his      hematochezia.  Also, his left ventricular function has returned to      normal, and his CHADS II score is 0.  There is also a history of      compliance issues and we will therefore most likely continue with      aspirin.  2. History of nonischemic cardiomyopathy - improved on most recent      echocardiogram - this was felt previously to be possibly secondary      to EtOH versus a viral cardiomyopathy.  It also may have been      tachycardia mediated due to atrial fibrillation.  Again, this is      improved.  3. History of substance abuse - he denies any substance abuse  recently.  4. Hematochezia - this is being evaluated by Gastroenterology in      [place].  I have encouraged him to follow up with him      concerning this issue.   I will see him back in 9 months.     Madolyn Frieze Scott Som, MD, [facility]  Electronically Signed    BSC/MedQ  DD: 12/16/2007  DT: 12/17/2007  Job #: [id number]

## 2010-06-17 NOTE — Assessment & Plan Note (Signed)
[facility]   NAME:  Scott Wagner, Scott Wagner            ACCOUNT NO.:  [id number]   MEDICAL RECORD NO.:  [id number]      DATE OF BIRTH:  05/10/1955   PHYSICIAN:  Noralyn Pick. Eden Emms, MD, [facility]    VISIT DATE:                                   OFFICE VISIT   A 52-year patient with recurrent AFib.  This is a TEE cardioversion.   The patient was sedated with 125 mcg of fentanyl and 10 mg of Versed.   Using digital technique, an OmniPlane probe was advanced into the  esophagus without incident.  There was mild left ventricular cavity  enlargement with low-normal ejection fraction of 50-55%.  Mitral valve  was mildly thickened with mild MR.  There was mild-to-moderate biatrial  enlargement.  The atrial septum was intact with no ASD.  There was mild  TR.  Aortic valve was trileaflet and normal.  Left atrial appendage was  moderately dilated.  No spontaneous contrast or clot.   Since the patient has no left atrial appendage clot, we proceeded with  cardioversion.  We had good generalized sedation.  He was cardioverted  with a single 200-joule biphasic shock.  He converted from atrial  fibrillation at a rate of 88 to sinus rhythm at a rate of 60.   The patient was on therapeutic heparin at the time of the cardioversion  and will remain on this until his INR is greater than 2.2 for 48 hours.   IMPRESSION:  Successful TEE-guided cardioversion.      Noralyn Pick. Eden Emms, MD, [facility]  Electronically Signed     PCN/MEDQ  D:  08/19/2007  T:  08/20/2007  Job:  [id number]

## 2010-06-17 NOTE — Assessment & Plan Note (Signed)
[facility]                            CARDIOLOGY OFFICE NOTE   Scott, Wagner                     MRN:          [id number]  DATE:03/10/2007                            DOB:          12-Feb-1955    [name] returns followup today.  Has a history of a mild muscular  cardiomyopathy atrial fibrillation.  He had a previous cardioversion.  Since I last saw him he denies any dyspnea on exertion, orthopnea, PND,  pedal edema, palpitations, presyncope, syncope or chest pain.  He states  he has not used alcohol or drugs.  His medications include digoxin 0.25  mg daily, lisinopril 5 mg daily, Lasix 20 mg daily, Toprol 200 mg daily  and aspirin 325 mg daily.   PHYSICAL EXAM:  Shows a blood pressure of 128/80.  His pulse is 51.  Weighs 187 pounds.  HEENT:  Normal.  Neck is supple.  CHEST:  Clear.  CARDIOVASCULAR:  Bradycardic rate and regular rhythm.  ABDOMEN:  Benign.  EXTREMITIES:  Show no edema.   Electrocardiogram shows sinus rhythm at rate of 51.  There are no  significant ST changes.   DIAGNOSES:  1. Atrial fibrillation status post cardioversion - the patient is      maintaining sinus rhythm on his home.  He will continue on digoxin      and Toprol in case his atrial fibrillation recurs.  We have taken      off Coumadin previously as he has a history of noncompliance,      possible substance abuse.  He does state that he has not done this      at present.  We will continue his aspirin 325 mg p.o. daily.  2. History of nonischemic cardiomyopathy - we have felt this may be      related to EtOH the past versus viral episode.  We will plan to      repeat his echocardiogram to reassess his LV function.  He will      continue on his ACE inhibitor and beta blockers well as digoxin and      I will check a BMET to follow potassium and renal function.  3. History of substance abuse.   We will see back in 9 months.     Madolyn Frieze Jens Som, MD,  [facility]  Electronically Signed    BSC/MedQ  DD: 03/10/2007  DT: 03/11/2007  Job #: [id number]   cc:   Dalbert Mayotte, M.D.

## 2010-06-17 NOTE — Discharge Summary (Signed)
NAME:  Scott Wagner, Scott Wagner NO.:  [id number]   MEDICAL RECORD NO.:  [id number]          PATIENT TYPE:  INP   LOCATION:  3710                         FACILITY:  [facility]   PHYSICIAN:  Madolyn Frieze. Jens Som, MD, FACCDATE OF BIRTH:  1955-04-23   DATE OF ADMISSION:  09/14/2008  DATE OF DISCHARGE:  09/18/2008                               DISCHARGE SUMMARY   PRIMARY CARDIOLOGIST:  Madolyn Frieze. Jens Som, MD, [facility]   PRIMARY CARE Krithika Tome:  Cleophas Dunker. [name], MD   DISCHARGE DIAGNOSIS:  Atrial flutter with rapid ventricular response.   SECONDARY DIAGNOSES:  1. Atrial fibrillation, status post cardioversion this admission.  2. History of nonischemic cardiomyopathy, presumed to be tachycardia      mediated with ejection fraction of 50-55% by 2D echocardiogram this      admission.  3. History of substance abuse including alcohol and drugs.  4. Ongoing tobacco abuse.  5. History of hematochezia.   ALLERGIES:  No known drug allergies.   PROCEDURES:  1. 2D echocardiogram revealing ejection fraction of 50-55% without      definite regional wall motion abnormalities.  Mildly dilated left      atrium.  2. Successful cardioversion with one 120-J biphasic shock restoring      sinus rhythm.   HISTORY OF PRESENT ILLNESS:  A 55 year old Caucasian male with prior  history of atrial fibrillation, status post cardioversion x2, the last  of which was July 2009.  He had previous history of nonischemic  cardiomyopathy, which was felt to be tachycardia mediated, although last  measured EF in July 2009 was 55%.  He was in his usual state of health  until August 11, when he began to experience tachy palpitations with  irregularity, which he identified as atrial fibrillation.  This last  about half a day and resolved spontaneously.  On August 12, he had  recurrence at about 5 p.m., although this time he had a more of a  sustained tachycardia, which was different from his usual AFib.  He  presented to  the [facility] ED on August 13, was found to be in atrial  flutter with rapid ventricular rate of approximately 140.  His blood  pressure was in 90s to low 100s, and he was treated with IV fluids.  While in the emergency room without any treatment, he broke from A  flutter in 140s to atrial fibrillation in the 70s to 80s.  He was  admitted for further evaluation.   HOSPITAL COURSE:  Decision was made to initiate flecainide therapy given  the patient's recurrence of atrial arrhythmias.  He was treated with 300  mg x1 in the ER and then initiated on 100 mg b.i.d.  Despite flecainide  therapy, he remained in atrial fibrillation.  He was placed on Lovenox  therapy and subsequently Coumadin as it became more clear that he would  require cardioversion.  A 2-D echocardiogram was performed in August 15  revealing an EF of 50-55%.  The patient underwent successful  cardioversion on August 16.  As he is now status post cardioversion, he  will require Lovenox bridging with plan  for Coumadin x4 weeks.  We are  in the process of trying to have the patient enrolled in the Lovenox  assistance program so that he may be discharged home from the hospital  today.   As we have initiated flecainide therapy, the patient will require an  exercise treadmill test, which we have arranged for tomorrow, August 18  in our office at 12 p.m.  The patient will follow up with [facility] on Thursday, August 19.   DISCHARGE LABORATORIES:  Hemoglobin 11.8, hematocrit 34.4, WBC 6.3, and  platelets 171.  INR 1.1.  Sodium 140, potassium 4.2, chloride 109, CO2  of 24, BUN 10, creatinine 1.11, and glucose 111.  Total bilirubin 1.0,  alkaline phosphatase 64, AST 24, ALT 25, total protein 7.2, albumin 3.7,  and calcium 9.1.  CK 62, MB 1.9, and troponin I 0.11.  Total cholesterol  170, triglycerides 77, HDL 38, and LDL 117.  TSH 1.698.  Urine drug  screen was negative.  Urinalysis was normal.    DISPOSITION:  The patient will be discharged home today in good  condition, provided that we can arrange for Lovenox assistance.   FOLLOWUP PLANS AND APPOINTMENTS:  He will follow up in our office  tomorrow, August 18 for exercise treadmill testing at 12 noon.  Then,  follow up in [facility] on August 19 at 10 a.m.  Finally, he will follow up with Dr. Jens Som on September 17 at 11:45  a.m.   DISCHARGE MEDICATIONS:  1. Enoxaparin 90 mg subcu b.i.d. until INR therapeutic.  2. Flecainide 100 mg b.i.d.  3. Coumadin 5 mg 1-1/2 tablets q.p.m.  4. Aspirin 81 mg daily.  5. Metoprolol tartrate 25 mg b.i.d.   OUTSTANDING LABORATORIES AND STUDIES:  INR followup in our Coumadin  Clinic on August 19.   DURATION OF DISCHARGE/ENCOUNTER:  60 minutes including physician time.      Scott Wagner, ANP      Madolyn Frieze. Jens Som, MD, [facility]  Electronically Signed    CB/MEDQ  D:  09/18/2008  T:  09/18/2008  Job:  [id number]   cc:   Gregary Signs A. [name], MD

## 2010-06-17 NOTE — H&P (Signed)
NAME:  Scott Wagner, Scott Wagner NO.:  [id number]   MEDICAL RECORD NO.:  [id number]          PATIENT TYPE:  INP   LOCATION:  3707                         FACILITY:  [facility]   PHYSICIAN:  Veverly Fells. Excell Seltzer, MD  DATE OF BIRTH:  November 30, 1955   DATE OF ADMISSION:  09/14/2008  DATE OF DISCHARGE:                              HISTORY & PHYSICAL   PRIMARY CARDIOLOGIST:  Madolyn Frieze. Jens Som, MD, [facility]   PRIMARY CARE Johnta Couts:  Cleophas Dunker. [name], MD   PROFILE:  A 55 year old Caucasian male with prior history of AFib who  presents with atrial flutter and RVR.   PROBLEMS:  1. Atrial flutter with rapid ventricular response.  2. History of atrial fibrillation.      a.     Status post cardioversion in April 2008 and again in July       2009.      b.     Previously on Coumadin following cardioversions without       difficulty.  3. History of normal coronary arteries, status post catheterization,      May 03, 2006.  4. 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      a.     Last echocardiogram on August 18, 2007 showing ejection       fraction of 55%, which was up from 40% in March 2008.  5. History of substance abuse including drugs and alcohol.  6. Tobacco abuse which is ongoing, smoking 5-6 cigarettes a day.  7. History of hematochezia, which has since resolved.   HISTORY OF PRESENT ILLNESS:  A 55 year old Caucasian male with history  of atrial fibrillation, status post cardioversion x2.  The last was on  July 2009.  He also has a history of nonischemic cardiomyopathy with  improved EF by last echo in July 2009.  He was in his usual state of  health until this past Wednesday when he noted tachypalpitations and  irregularity similar to his previous AFib episodes.  This lasted about  half of a day and then resolves spontaneously.  His only symptoms during  his irregularity were palpitations, restlessness, irritability.  Yesterday about 5 p.m., he had  recurrent tachypalpitations and  irregularity followed by sustained tachycardia, which he thought was  different from his usual symptoms.  He came into the ED and was noted to  be an atrial flutter with rates to the 140s.  His blood pressure was  borderline and he was treated with IV fluids.  He has been stable with  occasional lightheadedness with sitting up while here.  Of note, he just  converted from atrial flutter to atrial fibrillation, which is rate  controlled in the 80s.  He is currently asymptomatic.   ALLERGIES:  No known drug allergies.   HOME MEDICATIONS:  1. Lopressor 50 mg b.i.d.  2. Aspirin 325 mg daily   FAMILY HISTORY:  Mother died of CVA at 91.  Father died of heart failure  at 6.  He has a brother who is alive and well.   SOCIAL HISTORY:  Lives in [place] with his wife.  He  works as a  Arboriculturist.  He has been smoking 5 or 6 cigarettes a day for the past 5  or 6 years.  He previously smoked heavier than that, but quit for many  years.  He also used to drink heavily, but does not drink any more.  He  used to use drugs, but has not used them in many years.  He is active at  home and had previously been walking 2 miles a day, but has not done  that since has been hot.   REVIEW OF SYSTEMS:  Positive for chest pain, palpitations,  lightheadedness.  He is a full code.  Otherwise, all systems were  reviewed and negative.   PHYSICAL EXAMINATION:  VITAL SIGNS:  Temperature 97.9, heart rate  currently 81, but was as high as 140, respirations 20, blood pressure  106/79, pulse ox 98% on room air.  GENERAL:  Pleasant white male in no acute distress.  Awake, alert, and  oriented x3, normal affect.  HEENT:  Normal.  Nares are grossly intact and nonfocal.  SKIN:  Warm and dry without lesions or masses.  MUSCULOSKELETAL:  Grossly normal without deformity or effusion.  NECK:  Supple without bruits or JVD.  LUNGS:  Respirations regular and unlabored.  Clear to  auscultation.  CARDIAC:  Irregularly irregular S1, S2, no S3, S4 murmurs.  ABDOMEN:  Round, soft, nontender, nondistended.  Bowel sounds present  x4.  EXTREMITIES:  Warm and dry, pink.  No clubbing, cyanosis, or edema.  Dorsalis pedis, posterior tibial pulses 2+ and equal bilaterally.   Chest x-ray shows no acute cardiopulmonary disease.  EKG initially  showed atrial flutter at a rate of 137, normal access, now shows atrial  fibrillation, rate 81, no acute ST-T changes.  Hemoglobin 15.9,  hematocrit 45.9, WBC 7.0, platelets 262.  Sodium 140, potassium 4.2,  chloride 109, CO2 24, BUN 10, creatinine 1.11, glucose 111, total  bilirubin 1.0, alkaline phosphatase 64, AST 24, ALT 25, total protein  7.2, albumin 3.7, CK-MB less than 1.0, troponin I less than 0.05,  calcium 9.1.   ASSESSMENT AND PLAN:  1. Atrial flutter with rapid ventricular response.  Rates were      variable between 60s and 130s.  Duration presumably since about 5      p.m. last night.  He is now broke and he is in atrial fibrillation      in the 80s with improved blood pressure.  We will plan to treat him      with flecainide 300 mg here in the ED.  We will admit him and watch      him on telemetry tonight, hopefully held converted to sinus rhythm.      Provided that he does, we will plan to initiate flecainide 100 mg      b.i.d.  While admitted, we will add heparin therapy and strongly      consider Coumadin especially if he does not break with flecainide      over the course of the next 12-24 hours.  If he does not break, we      would plan cardioversion on Monday.  As we are initiating      flecainide therapy, he will need followup exercise treadmill test      in the office in about 2 weeks.  We will repeat echo as well as a      TSH.  2. History atrial fibrillation, see above.  3. Nonischemic cardiomyopathy, follow up echo,  now that his rate has      come down.  His last EF was normal in July 2009.  4. Tobacco use.   Smoking cessation strongly advised.      Nicolasa Ducking, ANP      Veverly Fells. Excell Seltzer, MD  Electronically Signed    CB/MEDQ  D:  09/14/2008  T:  09/14/2008  Job:  [id number]

## 2010-06-20 NOTE — Assessment & Plan Note (Signed)
[facility]                            CARDIOLOGY OFFICE NOTE   Scott Wagner, Scott Wagner                     MRN:          [id number]  DATE:04/28/2006                            DOB:          [birth year]    [name] is a pleasant gentleman who was recently seen by Dr. Calton Dach for new-onset atrial fibrillation.  He states that back in late  February, he had the flu or a cold and subsequently developed  palpitations.  He was seen by Dr. Drue Wagner and noted to be in atrial  fibrillation and was referred to our office.  Noted TSH was normal.  He  had an echocardiogram performed on April 08, 2006, and his ejection  fraction was 40%.  There was moderate mitral regurgitation.  There was  also mild left atrial enlargement, mild right ventricular enlargement,  mild right atrial enlargement.  His right ventricular function was  reduced.  He was seen in followup in the office by Scott Wagner, and his  Lopressor was recently increased for rate control.  The patient states  that he has had worsening dyspnea on exertion.  There is no orthopnea,  but he was noticing increased cough last night.  He was unable to sleep.  There has been no chest pain or edema, and his palpitations have  improved.   His medications include Coumadin as directed and being followed by Dr.  Drue Wagner.  He is also on Lopressor 150 mg p.o. b.i.d.   PHYSICAL EXAMINATION:  VITAL SIGNS:  Blood pressure 123/83.  His pulse  is 110.  CHEST:  His chest shows a mild expiratory wheeze.  CARDIOVASCULAR:  A tachycardic rate and an irregular rhythm.  EXTREMITIES:  No edema.   His electrocardiogram shows atrial fibrillation with a rate of 110.  The  axis is normal.  There are nonspecific ST changes.   DIAGNOSES:  1. Cardiomyopathy of uncertain etiology.  2. Atrial fibrillation with a rapid ventricular response.  3. Remote history of substance abuse.  4. Coumadin  therapy.  5. History of mildly  elevated liver functions.   PLAN:  Scott Wagner is complaining of dyspnea and what sounds to be mild  congestive heart failure symptoms.  His heart rate is elevated today.  We will continue with his Lopressor at 150 mg p.o. b.i.d., and I will  add digoxin 0.25 mg p.o. daily.  We will also add lisinopril 5 mg p.o.  daily for after-load reduction, given his reduced LV function.  We will  also add Lasix 20 mg p.o. daily.  The etiology of his cardiomyopathy is  unclear.  We will schedule him for a cardiac catheterization to exclude  coronary disease.  The risks and benefits have been discussed, and he  agrees to proceed.  He also had a viral syndrome in late February.  This  could be a viral cardiomyopathy.  He also is in atrial fibrillation, and  his rate is elevated.  It could be tachycardia-mediated cardiomyopathy.  Finally, he does have a remote history of alcohol abuse, although this  seems less likely.  Regardless, we  will treat medically for now.  We  will hold his Coumadin four days prior to the procedure and resume  afterwards.  He will then need a cardioversion three weeks after his INR  has been therapeutic.  I will check a BMET and a BNP one week after  initiating the above medications.  We will check his pre-catheterization  laboratories today, including a ferritin to exclude infiltrative  cardiomyopathy.  I will see him back in two weeks.     Scott Frieze Jens Som, MD, [facility]  Electronically Signed    BSC/MedQ  DD: 04/28/2006  DT: 04/28/2006  Job #: [id number]   cc:   Scott Wagner, M.D.

## 2010-06-20 NOTE — Assessment & Plan Note (Signed)
[facility]                            CARDIOLOGY OFFICE NOTE   SATISH, HAMMERS                     MRN:          [id number]  DATE:05/19/2006                            DOB:          11/23/1955    [name] returns for followup today.  He has atrial fibrillation and  nonischemic cardiomyopathy.  Please refer to my previous notes for  details.  He did undergo cardiac catheterization secondary to his  cardiomyopathy on May 03, 2006.  He was found to have normal coronary  arteries.  His ejection fraction was 50% with mild global hypokinesis.  We have subsequently placed the patient on Coumadin with hopes of  cardioverting him to sinus rhythm.  Since that time, he feels much  better.  There is no dyspnea, chest pain, palpitations, or syncope.  There is no pedal edema.   His medications include:  1. Coumadin 5 mg on all days of the week other than 10 mg 3 times a      week.  2. Lopressor 150 mg p.o. b.i.d.  3. Digoxin 0.25 mg p.o. daily.  4. Lisinopril 5 mg p.o. daily.  5. Lasix 20 mg p.o. daily.  6. Cartia 120 mg p.o. daily.   PHYSICAL EXAMINATION:  Shows a blood pressure of 117/82, and his pulse  is 88.  He weighs 178 pounds.  NECK:  Supple.  CHEST:  Clear.  CARDIOVASCULAR EXAM:  Reveals an irregular rhythm.  EXTREMITIES:  Show no edema.   DIAGNOSES:  1. Nonischemic cardiomyopathy.  The etiology of this remains unclear.      His TSH previously was normal, and he had no coronary disease on      his cardiac catheterization.  This certainly may have been virally      mediated, as he had a upper respiratory infection in February.  It      could also be related to his history of ETOH.  Finally, it could be      tachycardia mediated due to his atrial fibrillation.  He will      continue with his Lopressor, ACE inhibitor, digoxin, and Lasix.  2. Atrial fibrillation with a rapid ventricular response.  We will      continue with his present  medications for rate control.  He is now      on Coumadin, and being followed by Dr. Drue Second.  Our goal INR will      be 2 to 3.  We will proceed with a transesophageal echocardiography      cardioversion to establish sinus rhythm.  He will need to continue      Coumadin for at least 4 weeks afterwards.  We were concerned about      his history of drug use, and questionable compliance, but he has      assured me that he is taking his medications.  3. Remote history of substance abuse.  The patient states he is not      abusing substances at this point.  This will need to be followed      closely.  4. Coumadin therapy.  Followed by Dr. Drue Second.  5. History of mildly elevated liver functions.  These will need to be      rechecked, and may be related to his history of alcohol use.  I      will see him back in 6 weeks.     Madolyn Frieze Jens Som, MD, [facility]  Electronically Signed    BSC/MedQ  DD: 05/19/2006  DT: 05/19/2006  Job #: [id number]   cc:   Dalbert Mayotte, M.D.

## 2010-06-20 NOTE — Consult Note (Signed)
NAME:  Scott Wagner, Scott Wagner NO.:  [id number]   MEDICAL RECORD NO.:  [id number]          PATIENT TYPE:  AMB   LOCATION:  ENDO                         FACILITY:  [facility]   PHYSICIAN:  Madolyn Frieze. Jens Som, MD, FACCDATE OF BIRTH:  February 04, 1955   DATE OF CONSULTATION:  DATE OF DISCHARGE:                                 CONSULTATION   This is a procedure note for cardioversion of atrial fibrillation.  The  patient was sedated with Pentothal 250 mg intravenously.  Synchronized  cardioversion with 120 joules (biphasic) resulted in atrial  fibrillation.  A repeat attempt with 150 joules resulted in sinus  bradycardia.  There were no immediate complications.  We would recommend  continuing his Coumadin, as well as follow-up as previously instructed.  Due to his bradycardia, we will discontinue his Cardizem.      Madolyn Frieze Jens Som, MD, [facility]  Electronically Signed     BSC/MEDQ  D:  05/28/2006  T:  05/28/2006  Job:  [id number]

## 2010-06-20 NOTE — Assessment & Plan Note (Signed)
[facility]                            CARDIOLOGY OFFICE NOTE   Scott Wagner, Scott Wagner                     MRN:          [id number]  DATE:04/28/2006                            DOB:          Oct 27, 1955    This note is concerning Scott Wagner.  He is a gentleman that I saw  in [place] today with atrial fibrillation and a remote history of  substance abuse.  After he left the office, I discussed the patient with  Dr. Dalbert Mayotte.  He apparently still has the potential for hiding  substance abuse with active heroin use.  He denied this in the office  today, and stated that he had discontinued his alcohol abuse.  However,  he was initiated on Coumadin, and was instructed to follow up with Dr.  Feliz Beam office, and he has not done this.  We will contact the patient  and make sure that he understands the importance of compliance with  followup and PT checks.  If he is unable to do this or we do indeed find  that he is continuing substance abuse, then we will need to discontinue  the Coumadin, as the risk will outweigh the benefit.  We will also  remind him of this when he comes for his catheterization on May 03, 2006.     Madolyn Frieze Jens Som, MD, [facility]  Electronically Signed    BSC/MedQ  DD: 04/28/2006  DT: 04/28/2006  Job #: [id number]

## 2010-06-20 NOTE — Assessment & Plan Note (Signed)
[facility]                            CARDIOLOGY OFFICE NOTE   STEWARD, SAMES                     MRN:          [id number]  DATE:04/08/2006                            DOB:          Jun 09, 1955    CARDIOLOGIST:  Dr. Veverly Fells. Cooper.   PRIMARY CARE PHYSICIAN:  Dr. Dalbert Mayotte.   HISTORY OF PRESENT ILLNESS:  Scott Wagner is a very pleasant 55 year old  male patient who saw Dr. Excell Seltzer initially on April 05, 2006 for atrial  fibrillation.  The patient was set up for an echocardiogram today.  He  was also placed on Coumadin.  During his echocardiogram today he noted  some symptoms of not feeling well and his heart rate was noted to be  elevated.  Therefore he was added on to my schedule.  His echocardiogram  actually is abnormal, revealing an EF of 40%, moderate mitral  regurgitation, dilated left atrium, RV systolic function mild to  moderately reduced, right atrium mildly dilated.  The patient notes that  he feels tired.  He did feel bloated last night with a little bit of  extra shortness of breath.  This seemed to subside this morning.  He is  taking his medications okay.  He denies chest discomfort.  Denies  syncope or near syncope.  Denies orthopnea or paroxysmal nocturnal  dyspnea.   CURRENT MEDICATIONS:  1. Metoprolol 100 mg twice daily.  2. Aspirin 81 mg daily.  3. Coumadin 5 mg daily.   ALLERGIES:  NO KNOWN DRUG ALLERGIES.   PHYSICAL EXAMINATION:  He is a well-nourished, well-developed male in no  distress.  Blood pressure is 122/80, pulse 112.  HEENT:  Unremarkable.  NECK:  No JVD.  CARDIO:  S1, S2, a irregular rhythm.  LUNGS:  Clear to auscultation bilaterally.  ABDOMEN:  Soft, normoactive bowel sounds, mild right upper quadrant pain  to deep palpation.  EXTREMITIES:  Without edema, calves soft, nontender.  SKIN:  Warm and dry.  NEUROLOGIC:  He is alert and oriented x3, cranial nerves II-XII grossly  intact.  ELECTROCARDIOGRAM:  Reveals atrial fibrillation with a heart rate of  130.  No acute changes.   IMPRESSION:  1. Persistent atrial fibrillation with rapid ventricular rate.  2. Reduced left ventricular function with an ejection fraction of 40%.  3. Moderate mitral regurgitation.  4. Elevated liver enzymes with an ALT of 52 by recent blood work done      by his primary care physician.   PLAN:  The patient was also interviewed and examined by Dr. Daleen Squibb.  His  ventricular rate is somewhat uncontrolled.  He is mildly symptomatic  with this.  At this point in time we have recommended to increase his  metoprolol to 150 mg twice a day.  He will continue on Coumadin and  follow up with Dr. Jens Som in the next several weeks as scheduled for a  possible cardioversion.  The patient does have a reduced ejection  fraction on his echocardiogram.  His images were difficult to read  secondary to his fast rate.  I reviewed this with Dr. Daleen Squibb and  he felt  that his ejection fraction is possibly underestimated at this point in  time.   I have asked the patient to follow up with his primary care physician  regarding his abdominal bloating, elevated liver function tests.      Scott Newcomer, PA-C  Electronically Signed      Scott Wagner. Daleen Squibb, MD, [facility]  Electronically Signed   SW/MedQ  DD: 04/08/2006  DT: 04/08/2006  Job #: [id number]   cc:   Dalbert Mayotte, M.D.

## 2010-06-20 NOTE — Letter (Signed)
April 08, 2006    Dalbert Mayotte, M.D.  [address]  [place], [place] [address]   RE:  Scott, Wagner  MRN:  [id number]  /  DOB:  Sep 15, 1955   Dear Jill Side:   I saw Scott Wagner in consultation at the [facility]  as an outpatient on April 05, 2006.   As you know, he is a very nice 55 year old gentleman who was sent for  evaluation of new onset atrial fibrillation. Scott Wagner describes the  development of an upper respiratory tract infection approximately two  weeks ago at which time he noticed some increased palpitations and a  feeling of heart racing for very short periods in the past, but they  never lasted for more than a few seconds. He had been feeling these  palpitations for approximately one week now. He was seen in your office  and found to be in atrial fibrillation with rapid ventricular response  and has now been started on metoprolol and aspirin. He has had  improvement in his symptoms since that time but continues to have  palpitations when lying on his left side, as they worse in certain  positions. He does not have any dyspnea, orthopnea, PND, edema or chest  pain. He has no history of cardiac problems. He has not had  lightheadedness or syncope. He has no other complaints at this time.   PAST MEDICAL HISTORY:  Is pertinent for the following:  1. Chronic low back pain presumed secondary to osteoarthritis.  2. Remote hernia repair.   MEDICATIONS:  1. Metoprolol 100 mg twice daily.  2. Aspirin 81 mg twice daily.   ALLERGIES:  No known drug allergies.   SOCIAL HISTORY:  The patient lives in [place]. He is marred with  one stepchild. He works as a Engineer, maintenance (IT). He has smoked cigarettes  for the past 5 years. He rarely drinks alcohol and does not currently  use any recreational drugs or caffeine. He has not engaged in any formal  exercise.   FAMILY HISTORY:  Pertinent for his mother who had atrial fibrillation  starting at age 61. His father died of congestive heart failure at age  52. He has a 55 year old who is healthy. There is no history of coronary  artery disease in the family.   A complete 12-point review of systems was performed. Pertinent positives  included only fatigue, erectile dysfunction and osteoarthritis. All  other systems were reviewed and were negative except as detailed above.   On physical examination, he is alert and oriented in no acute distress.  His weight is 191 pounds. Blood pressure is 118/81, heart rate is 88,  respiratory rate is 12.  HEENT:  Normal.  NECK:  Normal carotid upstrokes without bruits. Jugular venous pressure  is normal.  LUNGS:  Clear to auscultation bilaterally.  HEART:  Irregularly irregular without murmurs or gallops. The apex is  discrete and nondisplaced. There is no right ventricular heave or lift.  ABDOMEN:  Soft, nontender. No organomegaly. No abdominal bruits.  BACK:  There is no flank tenderness.  EXTREMITIES:  No clubbing, cyanosis, or edema. Peripheral pulses are 2+  and equal throughout.  SKIN:  Warm and dry without rash.  NEUROLOGICAL:  Cranial nerves II-XII are intact. Strength is 5/5 and  equal in the arms and legs bilaterally.  LYMPHATICS:  There is no adenopathy.   EKG demonstrates atrial fibrillation with controlled ventricular  response. Otherwise within normal limits. Review of recent lab work  shows  a normal creatinine of 0.9, minimally elevated transaminases with  an AST of 52 and an ALT of 69, potassium of 4.2, hemoglobin 14.4. TSH of  0.56.   ASSESSMENT:  Scott Wagner is a 55 year old gentleman with new onset  atrial fibrillation. His atrial fibrillation may be related to his  recent upper respiratory infection although his symptoms have now  cleared and his atrial fibrillation persists.   His heart rate is currently well controlled on metoprolol. I would like  to perform a 2-D echocardiogram to evaluate for any  structural heart  disease. My suspicion will be that this will be normal. He has no  exertional symptoms or evidence of angina, and I do not think that a  stress study is necessary at this point.   I think overall it is likely that he will not tolerate his atrial  fibrillation in the long term as he continues to be symptomatic despite  good rate control currently. I have elected to start him on Coumadin  with anticipation of electrical cardioversion after one month of  therapeutic INRs. We reviewed some of the issues regarding Coumadin  therapy, and as we discussed, he will follow with you for his INR  monitoring. We have asked that he see Dr. Jens Som in followup in  [place] in approximately one month. At that time, Dr. Jens Som  could make arrangements for Scott Wagner's cardioversion.   Jill Side, thanks again for allowing me to see Scott Wagner. Please feel  free to contact me at any time if questions come up regarding his care.    Sincerely,      Scott Fells. Excell Seltzer, MD  Electronically Signed    MDC/MedQ  DD: 04/08/2006  DT: 04/08/2006  Job #: [id number]

## 2010-06-20 NOTE — Cardiovascular Report (Signed)
NAME:  Scott Wagner, Scott Wagner NO.:  [id number]   MEDICAL RECORD NO.:  [id number]          PATIENT TYPE:  OIB   LOCATION:  1965                         FACILITY:  [facility]   PHYSICIAN:  Rollene Rotunda, MD, FACCDATE OF BIRTH:  January 30, 1956   DATE OF PROCEDURE:  05/03/2006  DATE OF DISCHARGE:                            CARDIAC CATHETERIZATION   PRIMARY CARE PHYSICIAN:  Dr. Dalbert Mayotte.   CARDIOLOGIST:  Dr. Olga Millers.   PROCEDURE:  Left and right heart catheterization/coronary arteriography.   INDICATIONS:  Cardiomyopathy and dyspnea.   PROCEDURE NOTE:  Left heart catheterization was performed via the right  femoral artery, right heart catheterization was performed via the right  femoral vein.  Both vessels were cannulated using an anterior wall  puncture.  A #4-French arterial sheath and a #7-French venous sheath  were inserted via the modified Seldinger technique.  Preformed Judkins,  pigtail and a Swan-Ganz catheter were utilized.  The patient tolerated  the procedure well and left the lab in condition.   RESULTS:   HEMODYNAMIC RESULTS:  RA mean 1, RV 21/1, PA 19/8 with a mean of 13.  Pulmonary capillary pressure mean 5, LV 122/15, AO 122/99.   Cardiac output/Cardiac index (Fick) 5.2/2.45.   CORONARIES:  The left main was normal.  The LAD was large and normal.   The circumflex in the AV groove was normal.   There is a mid obtuse marginal which was small and normal.  A second mid  obtuse marginal was large and normal.  There was a small posterolateral  which was normal.   Ramus intermediate was very large and branching and normal.   The right coronary artery is dominant and normal throughout its course.  There is a PDA which was large and normal.  A posterolateral was small  and normal.   LEFT VENTRICULOGRAM:  The left ventriculogram was somewhat difficult to  assess because of rapid ventricular rate.  The EF was 50% with mild  global hypokinesis.   CONCLUSION:  Mild cardiomyopathy, atrial fibrillation with rapid rate,  normal coronaries.  Normal right heart pressures.   PLAN:  The patient does have a cardiomyopathy as described by Dr.  Jens Som.  The etiology is not clear, but could be rate related.  The  patient will be managed medically with rate control and anticoagulation.  He has had multiple discussions about the need to have his Coumadin  followed.  He will be started on the Coumadin with strict instructions  to get it checked later this week in Dr. Feliz Beam office.  He has a  followup appointment already made with Dr. Jens Som.      Rollene Rotunda, MD, [facility]  Electronically Signed     JH/MEDQ  D:  05/03/2006  T:  05/03/2006  Job:  [id number]   cc:   Dalbert Mayotte, M.D.  Madolyn Frieze Jens Som, MD, [facility]

## 2010-08-13 ENCOUNTER — Encounter: Payer: Self-pay | Admitting: Cardiology

## 2010-10-31 LAB — HEPARIN LEVEL (UNFRACTIONATED)
Heparin Unfractionated: 0.32
Heparin Unfractionated: 0.38
Heparin Unfractionated: 0.4
Heparin Unfractionated: 0.44
Heparin Unfractionated: 0.47

## 2010-10-31 LAB — POCT I-STAT, CHEM 8
BUN: 11
Calcium, Ion: 1.15
Chloride: 104
Creatinine, Ser: 1.3
Glucose, Bld: 93
HCT: 44
Hemoglobin: 15
Potassium: 4.4
Sodium: 140
TCO2: 29

## 2010-10-31 LAB — POCT CARDIAC MARKERS
CKMB, poc: <1 — ABNORMAL LOW
Myoglobin, poc: 71.7
Operator id: 146091
Troponin i, poc: <0.05

## 2010-10-31 LAB — CBC
HCT: 36.2 — ABNORMAL LOW
HCT: 38.2 — ABNORMAL LOW
HCT: 40
HCT: 41.2
HCT: 41.7
HCT: 43.6
Hemoglobin: 12.6 — ABNORMAL LOW
Hemoglobin: 13.6
Hemoglobin: 13.7
Hemoglobin: 14.2
Hemoglobin: 14.6
Hemoglobin: 15.1
MCHC: 34.2
MCHC: 34.5
MCHC: 34.6
MCHC: 34.7
MCHC: 35
MCHC: 35.5
MCV: 89.2
MCV: 89.4
MCV: 89.6
MCV: 90
MCV: 90
MCV: 90
Platelets: 142 — ABNORMAL LOW
Platelets: 156
Platelets: 160
Platelets: 162
Platelets: 178
Platelets: 200
RBC: 4.05 — ABNORMAL LOW
RBC: 4.27
RBC: 4.45
RBC: 4.57
RBC: 4.63
RBC: 4.89
RDW: 13.5
RDW: 13.7
RDW: 13.8
RDW: 13.8
RDW: 14
RDW: 14.1
WBC: 5.1
WBC: 5.2
WBC: 5.3
WBC: 6
WBC: 6.9
WBC: 8.6

## 2010-10-31 LAB — PROTIME-INR
INR: 1.1
INR: 1.1
INR: 1.2
INR: 1.6 — ABNORMAL HIGH
INR: 2 — ABNORMAL HIGH
INR: 2.2 — ABNORMAL HIGH
Prothrombin Time: 14.2
Prothrombin Time: 14.9
Prothrombin Time: 15.5 — ABNORMAL HIGH
Prothrombin Time: 19.4 — ABNORMAL HIGH
Prothrombin Time: 23.7 — ABNORMAL HIGH
Prothrombin Time: 26.1 — ABNORMAL HIGH

## 2010-10-31 LAB — LIPID PANEL
Cholesterol: 148
HDL: 40
LDL Cholesterol: 95
Total CHOL/HDL Ratio: 3.7
Triglycerides: 64
VLDL: 13

## 2010-10-31 LAB — DIFFERENTIAL
Basophils Absolute: 0.1
Basophils Relative: 1
Eosinophils Absolute: 0.2
Eosinophils Relative: 3
Lymphocytes Relative: 17
Lymphs Abs: 1.5
Monocytes Absolute: 0.7
Monocytes Relative: 8
Neutro Abs: 6.2
Neutrophils Relative %: 72

## 2010-10-31 LAB — TSH: TSH: 3.099

## 2010-10-31 LAB — DIGOXIN LEVEL: Digoxin Level: 0.9

## 2010-10-31 LAB — MAGNESIUM: Magnesium: 2

## 2010-10-31 LAB — B-NATRIURETIC PEPTIDE (CONVERTED LAB): Pro B Natriuretic peptide (BNP): 177 — ABNORMAL HIGH

## 2011-01-09 ENCOUNTER — Other Ambulatory Visit: Payer: Self-pay | Admitting: Cardiology

## 2011-02-04 ENCOUNTER — Emergency Department (HOSPITAL_COMMUNITY)
Admission: EM | Admit: 2011-02-04 | Discharge: 2011-02-04 | Disposition: A | Discharge status: Home / Self Care | Payer: Medicare Other | Attending: Emergency Medicine | Admitting: Emergency Medicine

## 2011-02-04 ENCOUNTER — Emergency Department (HOSPITAL_COMMUNITY): Payer: Medicare Other

## 2011-02-04 ENCOUNTER — Encounter (HOSPITAL_COMMUNITY): Payer: Self-pay | Admitting: Emergency Medicine

## 2011-02-04 DIAGNOSIS — I4891 Unspecified atrial fibrillation: Secondary | ICD-10-CM | POA: Insufficient documentation

## 2011-02-04 DIAGNOSIS — Z7982 Long term (current) use of aspirin: Secondary | ICD-10-CM | POA: Insufficient documentation

## 2011-02-04 DIAGNOSIS — R220 Localized swelling, mass and lump, head: Secondary | ICD-10-CM | POA: Insufficient documentation

## 2011-02-04 DIAGNOSIS — R509 Fever, unspecified: Secondary | ICD-10-CM | POA: Insufficient documentation

## 2011-02-04 DIAGNOSIS — R0781 Pleurodynia: Secondary | ICD-10-CM

## 2011-02-04 DIAGNOSIS — Z8619 Personal history of other infectious and parasitic diseases: Secondary | ICD-10-CM | POA: Insufficient documentation

## 2011-02-04 DIAGNOSIS — R51 Headache: Secondary | ICD-10-CM | POA: Insufficient documentation

## 2011-02-04 DIAGNOSIS — R079 Chest pain, unspecified: Secondary | ICD-10-CM | POA: Insufficient documentation

## 2011-02-04 DIAGNOSIS — Z79899 Other long term (current) drug therapy: Secondary | ICD-10-CM | POA: Insufficient documentation

## 2011-02-04 DIAGNOSIS — L03211 Cellulitis of face: Secondary | ICD-10-CM | POA: Insufficient documentation

## 2011-02-04 DIAGNOSIS — K029 Dental caries, unspecified: Secondary | ICD-10-CM | POA: Insufficient documentation

## 2011-02-04 DIAGNOSIS — R221 Localized swelling, mass and lump, neck: Secondary | ICD-10-CM | POA: Insufficient documentation

## 2011-02-04 DIAGNOSIS — L0201 Cutaneous abscess of face: Secondary | ICD-10-CM | POA: Insufficient documentation

## 2011-02-04 LAB — POCT I-STAT, CHEM 8
BUN: 21 mg/dL (ref 6–23)
Calcium, Ion: 1.21 mmol/L (ref 1.12–1.32)
Chloride: 109 mEq/L (ref 96–112)
Creatinine, Ser: 1 mg/dL (ref 0.50–1.35)
Glucose, Bld: 97 mg/dL (ref 70–99)
HCT: 36 % — ABNORMAL LOW (ref 39.0–52.0)
Hemoglobin: 12.2 g/dL — ABNORMAL LOW (ref 13.0–17.0)
Potassium: 3.7 mEq/L (ref 3.5–5.1)
Sodium: 139 mEq/L (ref 135–145)
TCO2: 19 mmol/L (ref 0–100)

## 2011-02-04 MED ORDER — ONDANSETRON HCL 4 MG/2ML IJ SOLN
4.0000 mg | Freq: Once | INTRAMUSCULAR | Status: AC
  Administered 2011-02-04: 4 mg via INTRAVENOUS
  Filled 2011-02-04: qty 2

## 2011-02-04 MED ORDER — DOXYCYCLINE HYCLATE 100 MG PO CAPS: 100.0000 mg | ORAL_CAPSULE | Freq: Two times a day (BID) | ORAL | Status: AC

## 2011-02-04 MED ORDER — DOXYCYCLINE HYCLATE 100 MG PO TABS
100.0000 mg | ORAL_TABLET | Freq: Once | ORAL | Status: AC
  Administered 2011-02-04: 100 mg via ORAL
  Filled 2011-02-04: qty 1

## 2011-02-04 MED ORDER — MORPHINE SULFATE 4 MG/ML IJ SOLN
6.0000 mg | Freq: Once | INTRAMUSCULAR | Status: AC
  Administered 2011-02-04: 6 mg via INTRAVENOUS
  Filled 2011-02-04: qty 2

## 2011-02-04 MED ORDER — MORPHINE SULFATE 4 MG/ML IJ SOLN
4.0000 mg | Freq: Once | INTRAMUSCULAR | Status: AC
  Administered 2011-02-04: 4 mg via INTRAVENOUS
  Filled 2011-02-04: qty 1

## 2011-02-04 MED ORDER — CEPHALEXIN 500 MG PO CAPS: 500.0000 mg | ORAL_CAPSULE | Freq: Four times a day (QID) | ORAL | Status: AC

## 2011-02-04 MED ORDER — IOHEXOL 300 MG/ML  SOLN
80.0000 mL | Freq: Once | INTRAMUSCULAR | Status: AC | PRN
  Administered 2011-02-04: 80 mL via INTRAVENOUS

## 2011-02-04 MED ORDER — CEPHALEXIN 250 MG PO CAPS
500.0000 mg | ORAL_CAPSULE | Freq: Once | ORAL | Status: AC
  Administered 2011-02-04: 500 mg via ORAL
  Filled 2011-02-04: qty 2

## 2011-02-04 MED ORDER — OXYCODONE HCL 5 MG PO TABS: 5.0000 mg | ORAL_TABLET | ORAL | Status: AC | PRN

## 2011-02-04 NOTE — ED Provider Notes (Signed)
Complains of swelling and pain to right cheek onset 5 days ago accompanied by pain at right rib cage, lateral aspect. Denies toothache admits to subjective fever no dyspnea no cough no other associated symptoms no treatment prior to coming here. On exam alert nontoxic HEENT exam right cheek is swollen and tender no fluctuance teeth are intact nontender no trismus neck is supple without cervical adenopathy lungs clear to auscultation chest is tender at right lateral rib cage no crepitance abdomen soft nontender  Doug Sou, MD 02/04/11 0405

## 2011-02-04 NOTE — ED Provider Notes (Signed)
History     CSN: [id number]  Arrival date & time 02/04/11  0059   First MD Initiated Contact with Patient 02/04/11 0239      Chief Complaint  Patient presents with  . Cellulitis    (Consider location/radiation/quality/duration/timing/severity/associated sxs/prior treatment) HPI Comments: Patient with five-day history of right cheek swelling and pain. Patient has not treated prior to arrival. He has never had any symptoms similar in the past. Patient denies fever, tooth pain, trouble breathing, trouble swallowing. Patient does have a history of dental caries. Patient states that he had subjective fever at onset 5 days ago with shaking chills. At this time he developed right lateral rib cage pain as well. Patient states he can only take one third of a full breath due to pain. He states that he feels like "I cracked a rib". Patient denies shortness of breath or cough. No treatment prior. No nausea, vomiting, diarrhea.  The history is provided by the patient.    Past Medical History  Diagnosis Date  . Campath-induced atrial fibrillation   . Blood in stool   . Unspecified polyarthropathy or polyarthritis, multiple sites   . Intrinsic asthma, unspecified   . DJD (degenerative joint disease) of lumbar spine   . Hepatitis C   . Substance abuse   . Cardiomyopathy     improved     Past Surgical History  Procedure Date  . Hernia repair 1966    Family History  Problem Relation Age of Onset  . Heart failure      CHF - father and mother. (mother also had severe RA)    History  Substance Use Topics  . Smoking status: Current Everyday Smoker  . Smokeless tobacco: Not on file   Comment: started in 1999. smoked 1/4 ppd   . Alcohol Use: No     quit in 2006       Review of Systems  Constitutional: Positive for fever and chills.  HENT: Positive for facial swelling. Negative for ear pain, congestion, sore throat, rhinorrhea, neck pain, neck stiffness, dental problem and ear  discharge.   Eyes: Negative for redness.  Respiratory: Negative for cough, shortness of breath and wheezing.   Cardiovascular: Positive for chest pain.  Gastrointestinal: Negative for nausea, vomiting, abdominal pain, diarrhea and constipation.  Genitourinary: Negative for dysuria.  Musculoskeletal: Negative for myalgias.  Skin: Negative for rash.  Neurological: Negative for dizziness.    Allergies  Review of patient's allergies indicates no known allergies.  Home Medications   Current Outpatient Rx  Name Route Sig Dispense Refill  . ASPIRIN 325 MG PO TABS Oral Take 325 mg by mouth daily.      Marland Kitchen FLECAINIDE ACETATE 100 MG PO TABS Oral Take 100 mg by mouth 2 (two) times daily.      Marland Kitchen METOPROLOL TARTRATE 25 MG PO TABS  TAKE ONE TABLET BY MOUTH EVERY DAY 30 tablet 12    BP 120/75  Pulse 69  Temp(Src) 99 F (37.2 C) (Oral)  Resp 16  SpO2 97%  Physical Exam  Nursing note and vitals reviewed. Constitutional: He is oriented to person, place, and time. He appears well-developed and well-nourished.  HENT:  Head: Atraumatic. No trismus in the jaw.  Right Ear: Tympanic membrane, external ear and ear canal normal.  Left Ear: Tympanic membrane, external ear and ear canal normal.  Nose: Nose normal.  Mouth/Throat: No oral lesions. Dental caries present. No uvula swelling. No oropharyngeal exudate or tonsillar abscesses.  Eyes: Conjunctivae are  normal. Pupils are equal, round, and reactive to light. Right eye exhibits no discharge. Left eye exhibits no discharge.  Neck: Normal range of motion. Neck supple.  Cardiovascular: Normal rate, regular rhythm and normal heart sounds.   Pulmonary/Chest: Effort normal and breath sounds normal. He has no wheezes. He exhibits tenderness.       Tenderness over right lateral chest wall. Normal expansion. No skin signs of trauma.  Abdominal: Soft. Bowel sounds are normal. There is no tenderness. There is no rebound and no guarding.  Musculoskeletal: He  exhibits no edema.  Neurological: He is alert and oriented to person, place, and time.  Skin: Skin is warm and dry.  Psychiatric: He has a normal mood and affect.    ED Course  Procedures (including critical care time)  Labs Reviewed  POCT I-STAT, CHEM 8 - Abnormal; Notable for the following:    Hemoglobin 12.2 (*)    HCT 36.0 (*)    All other components within normal limits  I-STAT, CHEM 8   Dg Ribs Unilateral W/chest Right  02/04/2011  *RADIOLOGY REPORT*  Clinical Data: Right lateral rib pain.  RIGHT RIBS AND CHEST - 3+ VIEW  Comparison: 12/27/2008  Findings: Mild cardiac enlargement with normal pulmonary vascularity.  Interstitial changes and peribronchial thickening suggesting chronic bronchitis.  No focal airspace consolidation. No pneumothorax.  No blunting of costophrenic angles.  Mild lumbar scoliosis.  The right ribs appear intact.  No displaced rib fractures identified.  No focal bone lesions. Residual contrast material in the urinary collecting system.  IMPRESSION: Chronic bronchitic changes.  No evidence of active pulmonary disease.  No displaced rib fractures.  Original Report Authenticated By: Marlon Pel, M.D.   Ct Maxillofacial W/cm  02/04/2011  *RADIOLOGY REPORT*  Clinical Data: Right facial swelling and pain with chills. Cellulitis.  CT MAXILLOFACIAL WITH CONTRAST  Technique:  Multidetector CT imaging of the maxillofacial structures was performed with intravenous contrast. Multiplanar CT image reconstructions were also generated.  Contrast: 80mL OMNIPAQUE IOHEXOL 300 MG/ML IV SOLN  Comparison: None.  Findings: Visualization of the lower facial regions is limited due to streak artifact from dental work.  There is evidence of soft tissue swelling and induration in the subcutaneous fat over the right cheek and over the right mandible.  Changes are consistent with cellulitis.  There is no focal fluid collection to suggest localized abscess.  Cervical lymph nodes are not  pathologically enlarged and likely represent reactive nodes.  Mucosal membrane thickening in the maxillary antra bilaterally.  Opacification of bilateral ethmoid air cells.  Mucosal membrane thickening in the frontal sinuses.  No acute air-fluid levels are demonstrated in changes probably represent chronic sinusitis.  Intraorbital structures appear symmetrical and intact.  IMPRESSION: Right facial soft tissue swelling and infiltration consistent with cellulitis.  No focal abscess is identified.  Visualization of the mandibles is limited due to streak artifact.  Original Report Authenticated By: Marlon Pel, M.D.     1. Cellulitis of cheek   2. Rib pain    Patient was seen and examined. Patient was discussed with and seen with Dr. Rennis Chris. Chest x-ray and CT of the face was ordered. CT was reviewed by myself. Radiologist does not comment on abscess but favors cellulitis. No rib fractures identified. Patient to be treated with oral antibiotics for cellulitis. First dose given in emergency department. Patient encouraged to followup at urgent care if no improvement in 2 days or return to the emergency department with worsening pain, swelling, fever.  Patient given pain medication for rib pain. Patient has a history of substance abuse and he was questioned about this. Patient states he is comfortable with taking this medication.  Patient counseled on use of narcotic pain medications. Counseled not to combine these medications with others containing tylenol. Urged not to drink alcohol, drive, or perform any other activities that requires focus while taking these medications. The patient verbalizes understanding and agrees with the plan.  Patient counseled to take 10 deep breaths every hour while awake to fully expand lungs and prevent pneumonia.  Patient verbalizes understanding and agrees with plan.    MDM  Patient with facial cellulitis. Patient with rib pain, no identified rib fractures.  Patient without systemic symptoms of illness at the current time. He is able to take oral antibiotics. Pain improved in emergency department. Patient appears well and is stable for discharge to home.       Eustace Moore [place], [place] 02/04/11 [phone]

## 2011-02-04 NOTE — ED Provider Notes (Signed)
Medical screening examination/treatment/procedure(s) were conducted as a shared visit with non-physician practitioner(s) and myself.  I personally evaluated the patient during the encounter  Doug Sou, MD 02/04/11 [phone]

## 2011-02-04 NOTE — ED Notes (Signed)
Rx x 3, pt voiced understanding to f/u with urgent care in 2 days for recheck if pain and swelling do not improve.

## 2011-02-04 NOTE — ED Notes (Signed)
PT. REPORTS PROGRESSING RIGHT CHEEK PAIN WITH SWELLING FOR SEVERAL DAYS WITH CHILLS , ALSO REPORTS RIGHT LATERAL RIBCAGE PAIN - DENIES INJURY OR FALL.

## 2012-01-01 ENCOUNTER — Other Ambulatory Visit: Payer: Self-pay | Admitting: Cardiology

## 2012-01-13 ENCOUNTER — Ambulatory Visit (INDEPENDENT_AMBULATORY_CARE_PROVIDER_SITE_OTHER): Payer: Medicare Other | Admitting: Physician Assistant

## 2012-01-13 ENCOUNTER — Encounter: Payer: Self-pay | Admitting: Physician Assistant

## 2012-01-13 VITALS — BP 119/72 | HR 55 | Ht 74.0 in | Wt 200.0 lb

## 2012-01-13 DIAGNOSIS — I428 Other cardiomyopathies: Secondary | ICD-10-CM

## 2012-01-13 DIAGNOSIS — I4891 Unspecified atrial fibrillation: Secondary | ICD-10-CM

## 2012-01-13 MED ORDER — FLECAINIDE ACETATE 100 MG PO TABS: 100.0000 mg | ORAL_TABLET | Freq: Two times a day (BID) | ORAL | Status: DC

## 2012-01-13 MED ORDER — METOPROLOL TARTRATE 25 MG PO TABS: 12.5000 mg | ORAL_TABLET | Freq: Two times a day (BID) | ORAL | Status: DC

## 2012-01-13 NOTE — Progress Notes (Signed)
  [address], [address] [place], [place]  [address] Phone: [phone], Fax:  [phone]  Date:  01/13/2012   Name:  Scott Wagner   DOB:  03-15-1955   MRN:  [id number]  PCP:  No primary provider on file.  Primary Cardiologist:  Dr. Olga Millers  Primary Electrophysiologist:  None    History of Present Illness: Scott Wagner is a 56 y.o. male who returns for follow up.  He has a hx of nonischemic cardiomyopathy and atrial fibrillation. Cardiomyopathy was likely tachycardia mediated. LHC 3/08: Normal coronary arteries, EF 50%. Last echo 09/2008: Mild LVH, EF 50-55%, mild LAE. Patient has been maintained in sinus rhythm on flecainide. Last seen by Dr. Jens Som 5/11.  Doing well.  The patient denies chest pain, shortness of breath, syncope, orthopnea, PND or significant pedal edema.   Labs (1/13):   K 3.7, creatinine 1.00, Hgb 12.2  Wt Readings from Last 3 Encounters:  01/13/12 200 lb (90.719 kg)  06/20/09 187 lb (84.823 kg)  10/19/08 187 lb (84.823 kg)     Past Medical History  Diagnosis Date  . Atrial fibrillation   . Blood in stool   . Unspecified polyarthropathy or polyarthritis, multiple sites   . Intrinsic asthma, unspecified   . DJD (degenerative joint disease) of lumbar spine   . Hepatitis C   . Substance abuse   . Cardiomyopathy     improved     Current Outpatient Prescriptions  Medication Sig Dispense Refill  . aspirin 325 MG tablet Take 325 mg by mouth daily.        . flecainide (TAMBOCOR) 100 MG tablet TAKE ONE TABLET BY MOUTH TWICE DAILY  60 tablet  0  . metoprolol tartrate (LOPRESSOR) 25 MG tablet TAKE ONE TABLET BY MOUTH EVERY DAY  30 tablet  12    Allergies:   No Known Allergies  Social History:  The patient  reports that he has been smoking.  He does not have any smokeless tobacco history on file. He reports that he does not drink alcohol or use illicit drugs.   ROS:  Please see the history of present illness.   All other  systems reviewed and negative.   PHYSICAL EXAM: VS:  BP 119/72  Pulse 55  Ht 6\' 2"  (1.88 m)  Wt 200 lb (90.719 kg)  BMI 25.68 kg/m2 Well nourished, well developed, in no acute distress HEENT: normal Neck: no JVD Cardiac:  normal S1, S2; RRR; no murmur Lungs:  clear to auscultation bilaterally, no wheezing, rhonchi or rales Abd: soft, nontender, no hepatomegaly Ext: no edema Skin: warm and dry Neuro:  CNs 2-12 intact, no focal abnormalities noted  EKG:  Sinus brady, HR 55, no acute changes      ASSESSMENT AND PLAN:  1. Atrial Fibrillation:   Maintaining NSR.  Continue Flecainide and metoprolol.  He takes metoprolol 25 mg 1 QD.  Will change to 12.5 mg bid.  Continue ASA.    2. Cardiomyopathy:   Reviewed notes.  Plan was to get follow up echo to recheck LVF last year.  Will order repeat 2D echo.    3. Disposition:   Follow up with Dr. Olga Millers in 1 year.  Luna Glasgow, PA-C  12:48 PM 01/13/2012

## 2012-01-13 NOTE — Patient Instructions (Addendum)
CHANGE METOPROLOL TO 12.5 MG TWICE DAILY  PLEASE SCHEDULE AND ECHO DX 425.4, 427.31  FOLLOW UP WITH DR. CRENSHAW IN 1 YR.

## 2012-01-19 ENCOUNTER — Telehealth: Payer: Self-pay | Admitting: *Deleted

## 2012-01-19 ENCOUNTER — Other Ambulatory Visit: Payer: Self-pay

## 2012-01-19 ENCOUNTER — Ambulatory Visit (HOSPITAL_COMMUNITY): Payer: Medicare Other | Attending: Cardiovascular Disease | Admitting: Radiology

## 2012-01-19 ENCOUNTER — Encounter: Payer: Self-pay | Admitting: Physician Assistant

## 2012-01-19 DIAGNOSIS — I517 Cardiomegaly: Secondary | ICD-10-CM | POA: Insufficient documentation

## 2012-01-19 DIAGNOSIS — I428 Other cardiomyopathies: Secondary | ICD-10-CM

## 2012-01-19 DIAGNOSIS — I079 Rheumatic tricuspid valve disease, unspecified: Secondary | ICD-10-CM | POA: Insufficient documentation

## 2012-01-19 DIAGNOSIS — I4891 Unspecified atrial fibrillation: Secondary | ICD-10-CM | POA: Insufficient documentation

## 2012-01-19 NOTE — Telephone Encounter (Signed)
Message copied by Tarri Fuller on Tue Jan 19, 2012  5:44 PM ------      Message from: [place], [place] T      Created: Tue Jan 19, 2012  2:12 PM       Echo ok with      Normal LV function.      Tereso Newcomer, PA-C 01/19/2012 2:11 PM

## 2012-01-19 NOTE — Progress Notes (Signed)
Echocardiogram performed.  

## 2012-01-19 NOTE — Telephone Encounter (Signed)
lmom echo normal 

## 2012-07-31 ENCOUNTER — Emergency Department (HOSPITAL_COMMUNITY): Payer: Self-pay

## 2012-07-31 ENCOUNTER — Emergency Department (HOSPITAL_COMMUNITY)
Admission: EM | Admit: 2012-07-31 | Discharge: 2012-07-31 | Disposition: A | Discharge status: Home / Self Care | Payer: Self-pay | Attending: Emergency Medicine | Admitting: Emergency Medicine

## 2012-07-31 ENCOUNTER — Encounter (HOSPITAL_COMMUNITY): Payer: Self-pay | Admitting: Nurse Practitioner

## 2012-07-31 DIAGNOSIS — J45909 Unspecified asthma, uncomplicated: Secondary | ICD-10-CM | POA: Insufficient documentation

## 2012-07-31 DIAGNOSIS — Z8679 Personal history of other diseases of the circulatory system: Secondary | ICD-10-CM | POA: Insufficient documentation

## 2012-07-31 DIAGNOSIS — M109 Gout, unspecified: Secondary | ICD-10-CM | POA: Insufficient documentation

## 2012-07-31 DIAGNOSIS — F17200 Nicotine dependence, unspecified, uncomplicated: Secondary | ICD-10-CM | POA: Insufficient documentation

## 2012-07-31 DIAGNOSIS — Z8719 Personal history of other diseases of the digestive system: Secondary | ICD-10-CM | POA: Insufficient documentation

## 2012-07-31 HISTORY — DX: Gout, unspecified: M10.9

## 2012-07-31 MED ORDER — OXYCODONE-ACETAMINOPHEN 5-325 MG PO TABS: 1.0000 | ORAL_TABLET | Freq: Four times a day (QID) | ORAL | Status: DC | PRN

## 2012-07-31 MED ORDER — INDOMETHACIN 50 MG PO CAPS: 50.0000 mg | ORAL_CAPSULE | Freq: Three times a day (TID) | ORAL | Status: DC | PRN

## 2012-07-31 MED ORDER — INDOMETHACIN 25 MG PO CAPS
50.0000 mg | ORAL_CAPSULE | Freq: Once | ORAL | Status: AC
  Administered 2012-07-31: 50 mg via ORAL
  Filled 2012-07-31: qty 2

## 2012-07-31 NOTE — ED Provider Notes (Signed)
History    CSN: [id number] Arrival date & time 07/31/12  1301  First MD Initiated Contact with Patient 07/31/12 1355     Chief Complaint  Patient presents with  . Hand Pain   (Consider location/radiation/quality/duration/timing/severity/associated sxs/prior Treatment) HPI  TAJI BARRETTO is a 57 y.o.male with a significant PMH of a fib, hepatitis C, substance abuse, cardiomyopathy presents to the ER with complaints of right hand pain that started yesterday. On Friday he lift something really heavy and felt a little bit of pain but it got worse the next day. He did not scratch his hand or get any wound to the skin. He describes the sensation as ground glass and very tender to the touch. He has had gout in a few joints previously and describes this as the feeling of gout but he has never had it in his hand. He has swelling to the area. No pain further up the arm. Very mild redness. No nausea, vomiting, diarrhea, weakness, fevers or chills   Past Medical History  Diagnosis Date  . Atrial fibrillation   . Blood in stool   . Unspecified polyarthropathy or polyarthritis, multiple sites   . Intrinsic asthma, unspecified   . DJD (degenerative joint disease) of lumbar spine   . Hepatitis C   . Substance abuse   . Cardiomyopathy     improved   . Hx of echocardiogram     a. Echo 12/13:  EF 60-65%, mod LAE, mild RAE, PASP 34   Past Surgical History  Procedure Laterality Date  . Hernia repair  1966   Family History  Problem Relation Age of Onset  . Heart failure      CHF - father and mother. (mother also had severe RA)   History  Substance Use Topics  . Smoking status: Current Every Day Smoker  . Smokeless tobacco: Not on file     Comment: started in 1999. smoked 1/4 ppd   . Alcohol Use: No     Comment: quit in 2006     Review of Systems  Musculoskeletal: Positive for joint swelling.  All other systems reviewed and are negative.    Allergies  Review of patient's  allergies indicates no known allergies.  Home Medications   Current Outpatient Rx  Name  Route  Sig  Dispense  Refill  . indomethacin (INDOCIN) 50 MG capsule   Oral   Take 1 capsule (50 mg total) by mouth 3 (three) times daily as needed.   30 capsule   0   . oxyCODONE-acetaminophen (PERCOCET/ROXICET) 5-325 MG per tablet   Oral   Take 1 tablet by mouth every 6 (six) hours as needed for pain.   20 tablet   0    BP 142/82  Pulse 60  Temp(Src) 97.9 F (36.6 C) (Oral)  Resp 20  SpO2 95% Physical Exam  Nursing note and vitals reviewed. Constitutional: He appears well-developed and well-nourished. No distress.  HENT:  Head: Normocephalic and atraumatic.  Eyes: Pupils are equal, round, and reactive to light.  Neck: Normal range of motion. Neck supple.  Cardiovascular: Normal rate and regular rhythm.   Pulmonary/Chest: Effort normal.  Abdominal: Soft.  Musculoskeletal:       Right hand: He exhibits decreased range of motion (slight decreased ROM due to pain of little finger), tenderness, bony tenderness and swelling. He exhibits normal two-point discrimination, normal capillary refill, no deformity and no laceration. Normal sensation noted. Normal strength noted.  Hands: Mild redness without induration or fluctuance. Very small amount of pain with passive ROM.   No pain distal to the joint. No obvious skin wound  Neurological: He is alert.  Skin: Skin is warm and dry.    ED Course  Procedures (including critical care time) Labs Reviewed - No data to display Dg Hand Complete Right  07/31/2012   *RADIOLOGY REPORT*  Clinical Data: Pain and swelling in the right hand and 05/08. Limited movement.  No history of trauma.  RIGHT HAND - COMPLETE 3+ VIEW  Comparison: None.  Findings: There are no acute bone or joint abnormalities.  No trauma or bone destruction.  Old fracture distal fifth metacarpal. Mild dorsal soft tissue swelling without focal soft tissue changes.  IMPRESSION:  Mild soft tissue swelling.  No acute findings.  No evidence of acute skeletal trauma.   Original Report Authenticated By: Sander Radon, M.D.   1. Gout     MDM  Gout vs ligament or tendon injury.  Highly doubt infection but s/sx for infection given to him in GREAT detail and he knows he needs to return if any of those develop. He has no PCP but will return to hte ED as needed.  Hx of gout, will treat as the same. Indomethacin and Percocet for pain. Referral for PCP given.  57 y.o.Navdeep G Lorey's evaluation in the Emergency Department is complete. It has been determined that no acute conditions requiring further emergency intervention are present at this time. The patient/guardian have been advised of the diagnosis and plan. We have discussed signs and symptoms that warrant return to the ED, such as changes or worsening in symptoms.  Vital signs are stable at discharge. Filed Vitals:   07/31/12 1308  BP: 142/82  Pulse: 60  Temp: 97.9 F (36.6 C)  Resp: 20    Patient/guardian has voiced understanding and agreed to follow-up with the PCP or specialist.   Dorthula Matas, PA-C 07/31/12 1423

## 2012-07-31 NOTE — ED Notes (Signed)
States he lifted something heavy Friday, then began to have R hand pain yesterday, then woke today with R hand swelling. Denies any other injuries. Cms intact.

## 2012-08-02 NOTE — ED Provider Notes (Signed)
Medical screening examination/treatment/procedure(s) were performed by non-physician practitioner and as supervising physician I was immediately available for consultation/collaboration.   Laray Anger, DO 08/02/12 1301

## 2012-12-26 ENCOUNTER — Other Ambulatory Visit: Payer: Self-pay | Admitting: Physician Assistant

## 2013-01-12 ENCOUNTER — Other Ambulatory Visit: Payer: Self-pay | Admitting: Physician Assistant

## 2013-01-27 ENCOUNTER — Encounter: Payer: Self-pay | Admitting: Cardiology

## 2013-01-27 ENCOUNTER — Ambulatory Visit (INDEPENDENT_AMBULATORY_CARE_PROVIDER_SITE_OTHER): Payer: Self-pay | Admitting: Cardiology

## 2013-01-27 VITALS — BP 158/80 | HR 52 | Ht 74.0 in | Wt 180.0 lb

## 2013-01-27 DIAGNOSIS — R030 Elevated blood-pressure reading, without diagnosis of hypertension: Secondary | ICD-10-CM

## 2013-01-27 DIAGNOSIS — I4891 Unspecified atrial fibrillation: Secondary | ICD-10-CM

## 2013-01-27 MED ORDER — FLECAINIDE ACETATE 100 MG PO TABS: 100.0000 mg | ORAL_TABLET | Freq: Two times a day (BID) | ORAL | Status: DC

## 2013-01-27 MED ORDER — METOPROLOL TARTRATE 25 MG PO TABS: 12.5000 mg | ORAL_TABLET | Freq: Two times a day (BID) | ORAL | Status: DC

## 2013-01-27 NOTE — Assessment & Plan Note (Signed)
Patient has had some episodes of atrial fibrillation. Most have been short-lived. He did have a more prolonged episode. He took 2 additional metoprolol with associated dizziness before converting. I have asked him to take only one additional metoprolol daily as needed. If his episodes increase in frequency then we would most likely need to change antiarrhythmics. We will discontinue flecainide and most likely treat with tikosyn. Continue present dose of metoprolol. Continue aspirin daily. He has no embolic risk factors although his blood pressure is elevated today and if he is diagnosed with hypertension he is Chads score would be 1.

## 2013-01-27 NOTE — Progress Notes (Signed)
     HPI: FU nonischemic cardiomyopathy and atrial fibrillation. Cardiomyopathy was likely tachycardia mediated. LHC 3/08: Normal coronary arteries, EF 50%. Last echocardiogram in December of 2013 showed normal LV function, moderate left atrial enlargement and mild to moderate right atrial enlargement. Patient has been maintained in sinus rhythm on flecainide. Also with history of substance abuse. Last seen by Tereso Newcomer 12/13. Since then, he does not have dyspnea on exertion, orthopnea, PND, pedal edema or exertional chest pain. In the past year he has had 3-4 episodes of presumed atrial fibrillation. He feels his heart skipping. Most of these episodes have terminated in approximately 10 minutes. However he had a more prolonged episode. He took 2 additional metoprolol and felt some dizziness. He then converted to sinus rhythm. He has not had syncope.    Current Outpatient Prescriptions  Medication Sig Dispense Refill  . flecainide (TAMBOCOR) 100 MG tablet TAKE 1 TABLET BY MOUTH TWICE DAILY....Marland KitchenNO MORE REFILLS UNTIL SEEN!  60 tablet  0  . metoprolol tartrate (LOPRESSOR) 25 MG tablet TAKE ONE-HALF TABLET BY MOUTH TWICE DAILY  30 tablet  0  . oxyCODONE-acetaminophen (PERCOCET/ROXICET) 5-325 MG per tablet Take 1 tablet by mouth every 6 (six) hours as needed for pain.  20 tablet  0   No current facility-administered medications for this visit.     Past Medical History  Diagnosis Date  . Atrial fibrillation   . Blood in stool   . Unspecified polyarthropathy or polyarthritis, multiple sites   . Intrinsic asthma, unspecified   . DJD (degenerative joint disease) of lumbar spine   . Hepatitis C   . Substance abuse   . Cardiomyopathy     improved   . Hx of echocardiogram     a. Echo 12/13:  EF 60-65%, mod LAE, mild RAE, PASP 34  . Gout     Past Surgical History  Procedure Laterality Date  . Hernia repair  1966    History   Social History  . Marital Status: Married    Spouse Name:  N/A    Number of Children: N/A  . Years of Education: N/A   Occupational History  . Not on file.   Social History Main Topics  . Smoking status: Current Every Day Smoker  . Smokeless tobacco: Not on file     Comment: started in 1999. smoked 1/4 ppd   . Alcohol Use: No     Comment: quit in 2006   . Drug Use: No     Comment: quit in 2006   . Sexual Activity: Not on file   Other Topics Concern  . Not on file   Social History Narrative   Married; step children; disabled.     ROS: no fevers or chills, productive cough, hemoptysis, dysphasia, odynophagia, melena, hematochezia, dysuria, hematuria, rash, seizure activity, orthopnea, PND, pedal edema, claudication. Remaining systems are negative.  Physical Exam: Well-developed well-nourished in no acute distress.  Skin is warm and dry.  HEENT is normal.  Neck is supple.  Chest is clear to auscultation with normal expansion.  Cardiovascular exam is regular rate and rhythm.  Abdominal exam nontender or distended. No masses palpated. Extremities show no edema. neuro grossly intact  ECG sinus rhythm at a rate of 52. Left ventricular hypertrophy.

## 2013-01-27 NOTE — Assessment & Plan Note (Signed)
Patient's blood pressure is elevated today. I've asked him to follow this at home and we will add medications as needed.

## 2013-01-27 NOTE — Patient Instructions (Signed)
Your physician recommends that you continue on your current medications as directed. Please refer to the Current Medication list given to you today.   Your physician recommends that you keep  Your follow-up appointment in: 3 months with Dr. Jens Som

## 2013-01-31 ENCOUNTER — Telehealth: Payer: Self-pay | Admitting: Cardiology

## 2013-01-31 NOTE — Telephone Encounter (Signed)
Spoke with patient's wife who requested information about patient's medications listed on AVS - wife in particular was questioning whether patient is taking Oxycodone currently.  I advised patient's wife of HIPAA regulations and no further information was given.

## 2013-01-31 NOTE — Telephone Encounter (Signed)
i do not prescribe pain meds for him Scott Wagner

## 2013-01-31 NOTE — Telephone Encounter (Signed)
New Problem:  Pt's wife states she has questions about her husband's medications list that he recently brought home from his appt. Pt's wife would like a call back.

## 2013-04-17 ENCOUNTER — Encounter: Payer: Self-pay | Admitting: Cardiology

## 2013-04-17 NOTE — Progress Notes (Signed)
      HPI: FU nonischemic cardiomyopathy and atrial fibrillation. Cardiomyopathy was likely tachycardia mediated. LHC 3/08: Normal coronary arteries, EF 50%. Last echocardiogram in December of 2013 showed normal LV function, moderate left atrial enlargement and mild to moderate right atrial enlargement. Patient has been maintained in sinus rhythm on flecainide. Also with history of substance abuse. Last seen 12/14. Since then,    Current Outpatient Prescriptions  Medication Sig Dispense Refill  . flecainide (TAMBOCOR) 100 MG tablet Take 1 tablet (100 mg total) by mouth 2 (two) times daily.  60 tablet  12  . metoprolol tartrate (LOPRESSOR) 25 MG tablet Take 0.5 tablets (12.5 mg total) by mouth 2 (two) times daily.  30 tablet  12  . oxyCODONE-acetaminophen (PERCOCET/ROXICET) 5-325 MG per tablet Take 1 tablet by mouth every 6 (six) hours as needed for pain.  20 tablet  0   No current facility-administered medications for this visit.     Past Medical History  Diagnosis Date  . Atrial fibrillation   . Blood in stool   . Unspecified polyarthropathy or polyarthritis, multiple sites   . Intrinsic asthma, unspecified   . DJD (degenerative joint disease) of lumbar spine   . Hepatitis C   . Substance abuse   . Cardiomyopathy     improved   . Hx of echocardiogram     a. Echo 12/13:  EF 60-65%, mod LAE, mild RAE, PASP 34  . Gout     Past Surgical History  Procedure Laterality Date  . Hernia repair  1966    History   Social History  . Marital Status: Married    Spouse Name: N/A    Number of Children: N/A  . Years of Education: N/A   Occupational History  . Not on file.   Social History Main Topics  . Smoking status: Current Every Day Smoker  . Smokeless tobacco: Not on file     Comment: started in 1999. smoked 1/4 ppd   . Alcohol Use: No     Comment: quit in 2006   . Drug Use: No     Comment: quit in 2006   . Sexual Activity: Not on file   Other Topics Concern  . Not on  file   Social History Narrative   Married; step children; disabled.     ROS: no fevers or chills, productive cough, hemoptysis, dysphasia, odynophagia, melena, hematochezia, dysuria, hematuria, rash, seizure activity, orthopnea, PND, pedal edema, claudication. Remaining systems are negative.  Physical Exam: Well-developed well-nourished in no acute distress.  Skin is warm and dry.  HEENT is normal.  Neck is supple.  Chest is clear to auscultation with normal expansion.  Cardiovascular exam is regular rate and rhythm.  Abdominal exam nontender or distended. No masses palpated. Extremities show no edema. neuro grossly intact  ECG     This encounter was created in error - please disregard.

## 2013-05-02 ENCOUNTER — Encounter: Payer: Self-pay | Admitting: Cardiology

## 2013-12-06 IMAGING — CR DG HAND COMPLETE 3+V*R*
3 series · 3 of 3 positions shown · non-contrast
Comparison: None.

CLINICAL DATA: Pain and swelling in the right hand and [DATE].
Limited movement.  No history of trauma.

RIGHT HAND - COMPLETE 3+ VIEW

[view not recorded (1 of 3)]
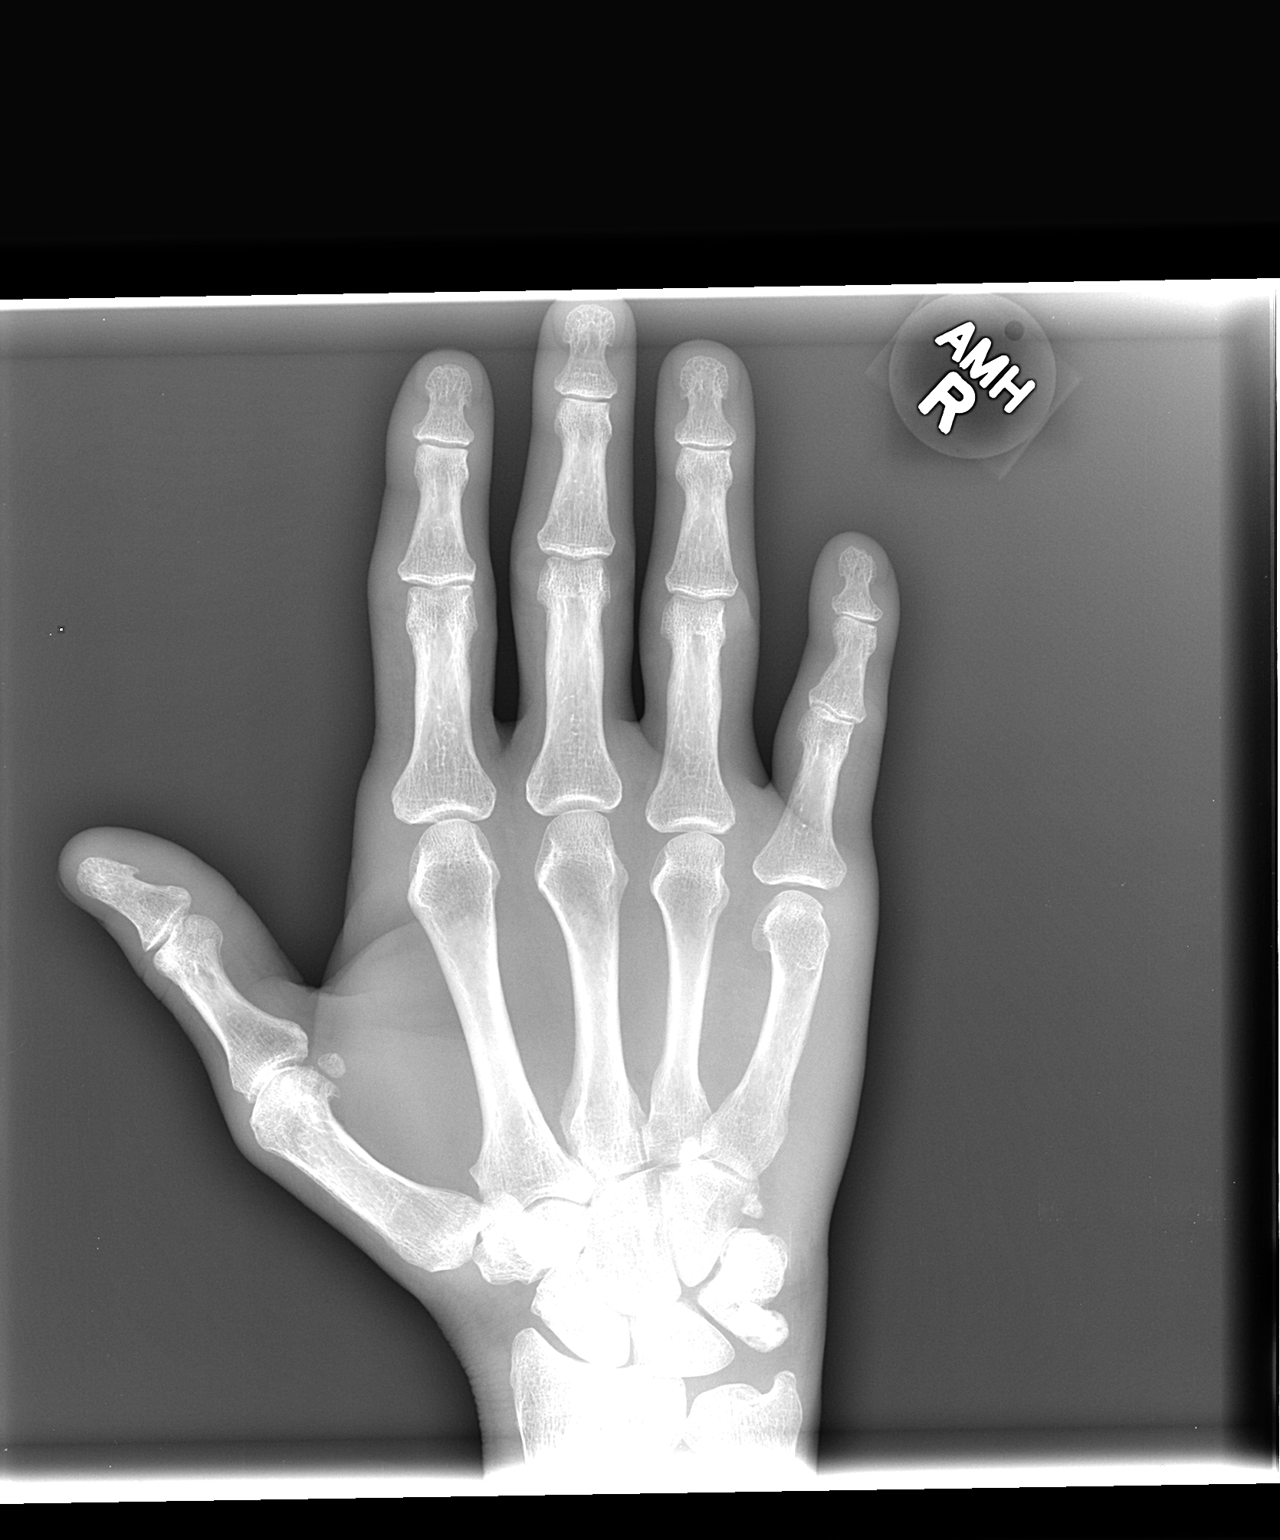

[view not recorded (2 of 3)]
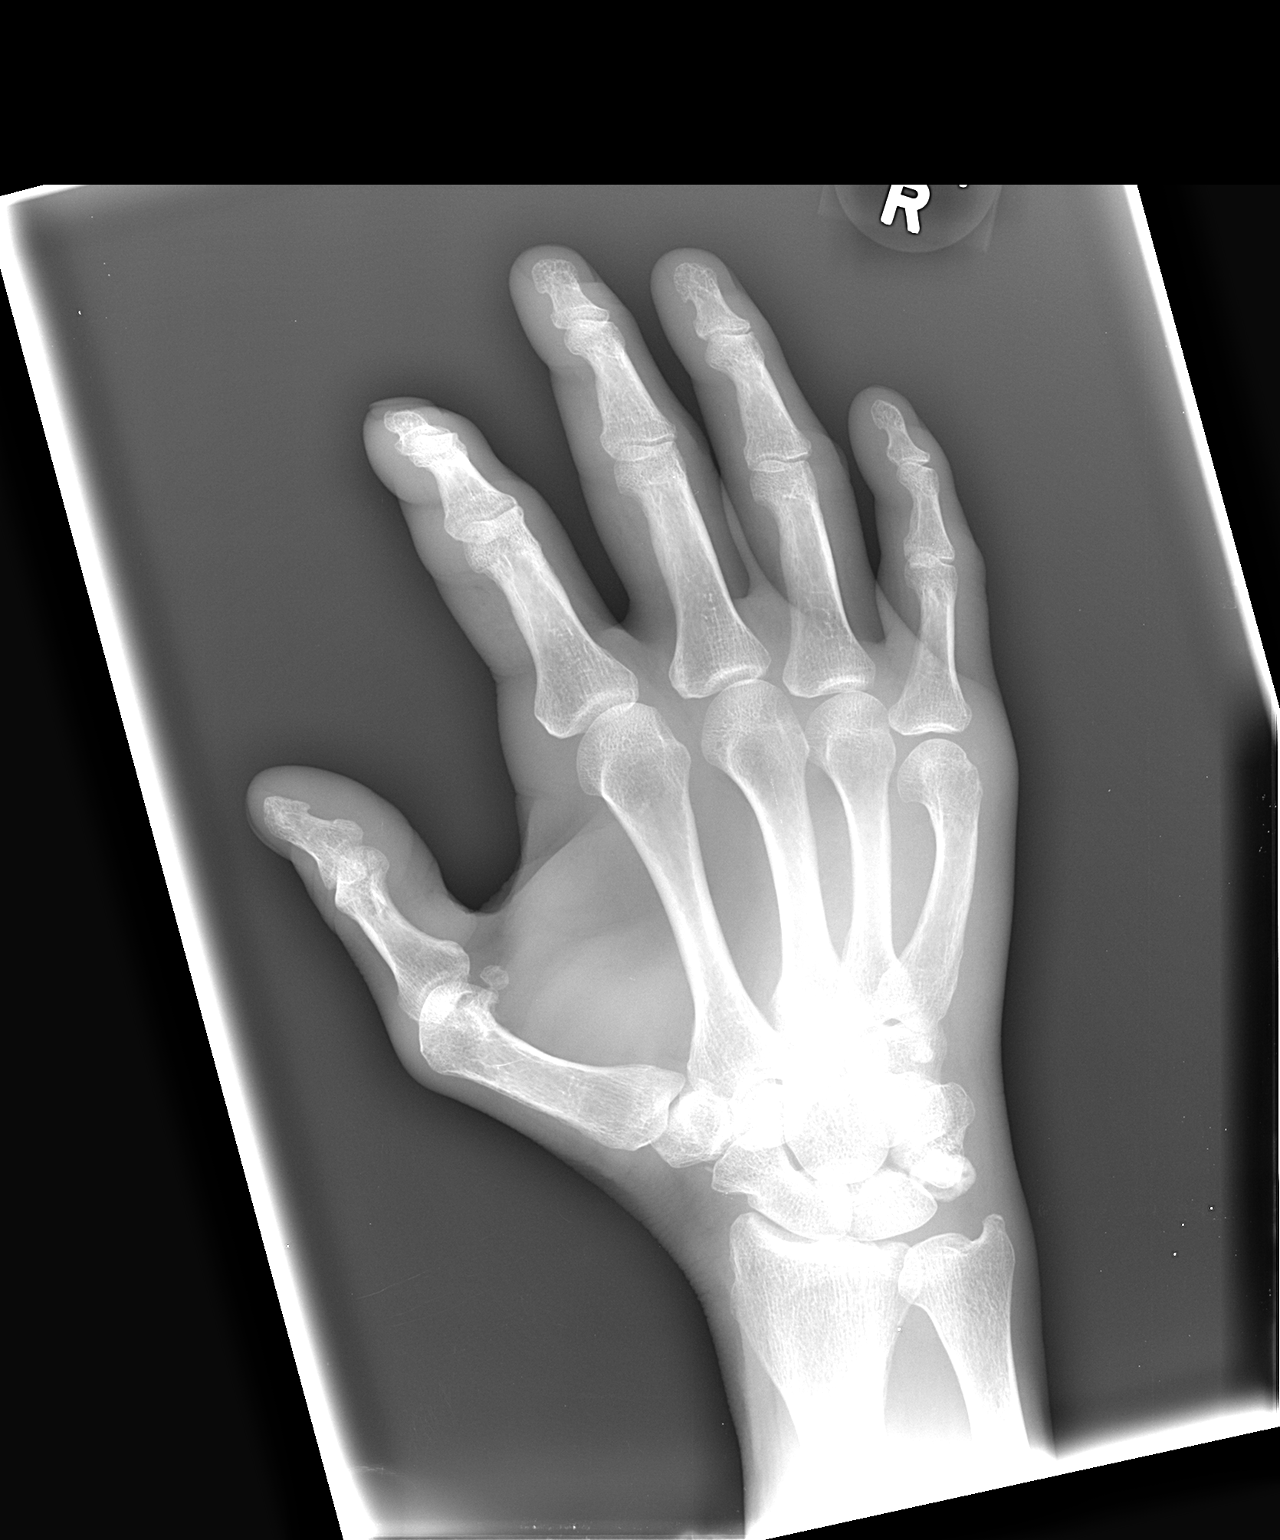

[view not recorded (3 of 3)]
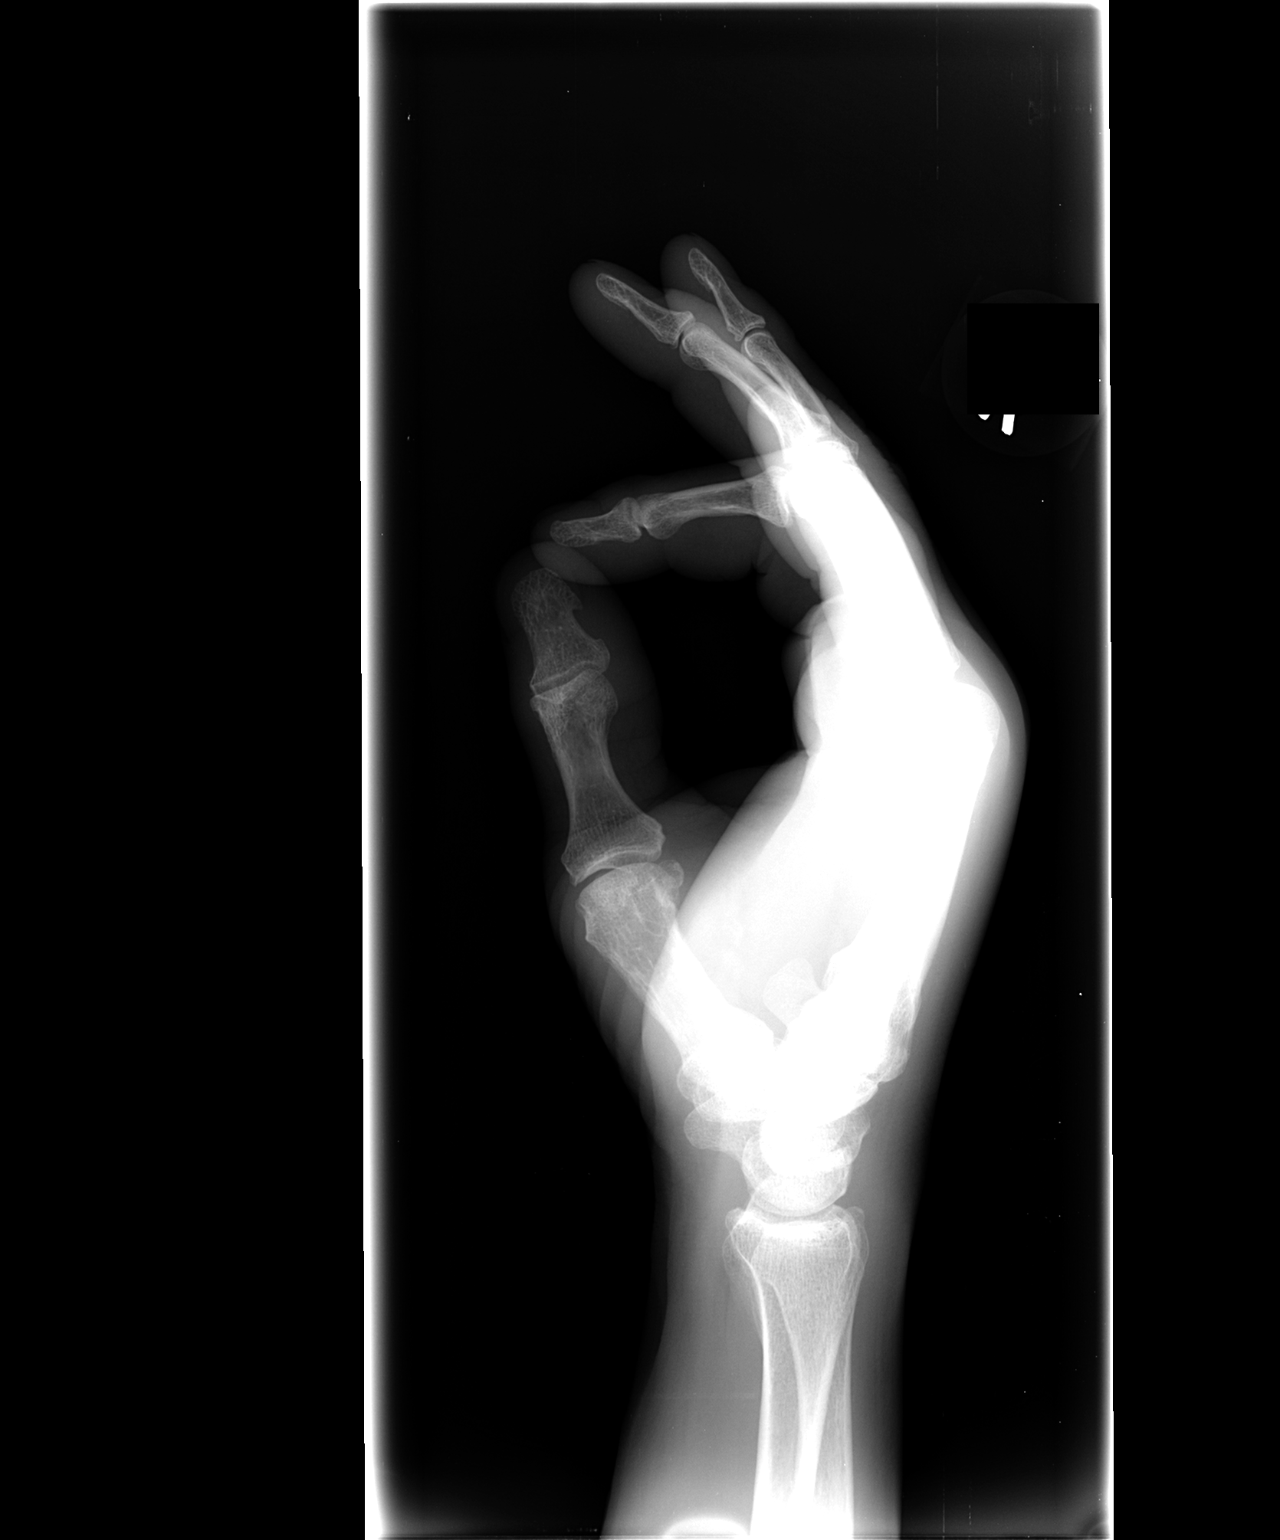

[3 of 3 positions shown; findings below may reference images not displayed]

FINDINGS: There are no acute bone or joint abnormalities.  No
trauma or bone destruction.  Old fracture distal fifth metacarpal.
Mild dorsal soft tissue swelling without focal soft tissue changes.
IMPRESSION: Mild soft tissue swelling.  No acute findings.  No evidence of
acute skeletal trauma.

## 2014-01-30 ENCOUNTER — Other Ambulatory Visit: Payer: Self-pay | Admitting: Cardiology

## 2014-01-30 ENCOUNTER — Telehealth: Payer: Self-pay | Admitting: Cardiology

## 2014-01-30 MED ORDER — METOPROLOL TARTRATE 25 MG PO TABS: 12.5000 mg | ORAL_TABLET | Freq: Two times a day (BID) | ORAL | Status: DC

## 2014-01-31 ENCOUNTER — Other Ambulatory Visit: Payer: Self-pay | Admitting: *Deleted

## 2014-01-31 MED ORDER — FLECAINIDE ACETATE 100 MG PO TABS: 100.0000 mg | ORAL_TABLET | Freq: Two times a day (BID) | ORAL | Status: DC

## 2014-02-14 ENCOUNTER — Ambulatory Visit (INDEPENDENT_AMBULATORY_CARE_PROVIDER_SITE_OTHER): Payer: Self-pay | Admitting: Cardiology

## 2014-02-14 ENCOUNTER — Encounter: Payer: Self-pay | Admitting: Cardiology

## 2014-02-14 DIAGNOSIS — I4891 Unspecified atrial fibrillation: Secondary | ICD-10-CM

## 2014-02-14 MED ORDER — FLECAINIDE ACETATE 100 MG PO TABS: 100.0000 mg | ORAL_TABLET | Freq: Two times a day (BID) | ORAL | Status: DC

## 2014-02-14 MED ORDER — METOPROLOL TARTRATE 25 MG PO TABS: 12.5000 mg | ORAL_TABLET | Freq: Two times a day (BID) | ORAL | Status: DC

## 2014-02-14 NOTE — Assessment & Plan Note (Signed)
Patient counseled on discontinuing. 

## 2014-02-14 NOTE — Progress Notes (Signed)
      HPI: FU nonischemic cardiomyopathy and atrial fibrillation. Cardiomyopathy was likely tachycardia mediated. LHC 3/08: Normal coronary arteries, EF 50%. Last echocardiogram in December of 2013 showed normal LV function, moderate left atrial enlargement and mild to moderate right atrial enlargement. Patient has been maintained in sinus rhythm on flecainide. Also with history of substance abuse. Since last seen, the patient denies any dyspnea on exertion, orthopnea, PND, pedal edema, palpitations, syncope or chest pain.   Current Outpatient Prescriptions  Medication Sig Dispense Refill  . flecainide (TAMBOCOR) 100 MG tablet Take 1 tablet (100 mg total) by mouth 2 (two) times daily. 60 tablet 1  . metoprolol tartrate (LOPRESSOR) 25 MG tablet Take 0.5 tablets (12.5 mg total) by mouth 2 (two) times daily. 30 tablet 1  . oxyCODONE-acetaminophen (PERCOCET/ROXICET) 5-325 MG per tablet Take 1 tablet by mouth every 6 (six) hours as needed for pain. 20 tablet 0   No current facility-administered medications for this visit.     Past Medical History  Diagnosis Date  . Atrial fibrillation   . Blood in stool   . Unspecified polyarthropathy or polyarthritis, multiple sites   . Intrinsic asthma, unspecified   . DJD (degenerative joint disease) of lumbar spine   . Hepatitis C   . Substance abuse   . Cardiomyopathy     improved   . Hx of echocardiogram     a. Echo 12/13:  EF 60-65%, mod LAE, mild RAE, PASP 34  . Gout     Past Surgical History  Procedure Laterality Date  . Hernia repair  1966    History   Social History  . Marital Status: Married    Spouse Name: N/A    Number of Children: N/A  . Years of Education: N/A   Occupational History  . Not on file.   Social History Main Topics  . Smoking status: Former Smoker    Quit date: 02/02/2014  . Smokeless tobacco: Not on file     Comment: started in 1999. smoked 1/4 ppd   . Alcohol Use: No     Comment: quit in 2006   . Drug  Use: No     Comment: quit in 2006   . Sexual Activity: Not on file   Other Topics Concern  . Not on file   Social History Narrative   Married; step children; disabled.     ROS: no fevers or chills, productive cough, hemoptysis, dysphasia, odynophagia, melena, hematochezia, dysuria, hematuria, rash, seizure activity, orthopnea, PND, pedal edema, claudication. Remaining systems are negative.  Physical Exam: Well-developed well-nourished in no acute distress.  Skin is warm and dry.  HEENT is normal.  Neck is supple.  Chest is clear to auscultation with normal expansion.  Cardiovascular exam is regular rate and rhythm.  Abdominal exam nontender or distended. No masses palpated. Extremities show no edema. neuro grossly intact  ECG sinus rhythm at a rate of 59. No ST changes.

## 2014-02-14 NOTE — Assessment & Plan Note (Addendum)
Patient remains in sinus rhythm. Other than brief palpitations he has not had any sustained arrhythmias. Continue flecainide and metoprolol. He has no embolic risk factors. Continue aspirin. We can consider a different antiarrhythmic in the future if atrial fibrillation becomes more frequent.

## 2014-02-14 NOTE — Patient Instructions (Signed)
Your physician wants you to follow-up in: ONE YEAR WITH DR CRENSHAW You will receive a reminder letter in the mail two months in advance. If you don't receive a letter, please call our office to schedule the follow-up appointment.  

## 2014-08-03 ENCOUNTER — Other Ambulatory Visit: Payer: Self-pay | Admitting: Cardiology

## 2014-08-03 NOTE — Telephone Encounter (Signed)
Rx(s) sent to pharmacy electronically.  

## 2014-08-17 ENCOUNTER — Other Ambulatory Visit: Payer: Self-pay | Admitting: *Deleted

## 2014-08-17 MED ORDER — METOPROLOL TARTRATE 25 MG PO TABS: 12.5000 mg | ORAL_TABLET | Freq: Two times a day (BID) | ORAL | Status: DC

## 2015-01-25 ENCOUNTER — Other Ambulatory Visit: Payer: Self-pay | Admitting: Cardiology

## 2015-03-05 NOTE — Progress Notes (Signed)
      HPI: FU nonischemic cardiomyopathy and atrial fibrillation. Cardiomyopathy was likely tachycardia mediated. LHC 3/08: Normal coronary arteries, EF 50%. Last echocardiogram in December of 2013 showed normal LV function, moderate left atrial enlargement and mild to moderate right atrial enlargement. Patient has been maintained in sinus rhythm on flecainide. Also with history of substance abuse. Since last seen, He does not have dyspnea on exertion, orthopnea, PND, pedal edema, chest pain or syncope. He had a bout of nausea and vomiting in the fall and developed brief atrial fibrillation while vomiting. He took an extra flecainide and his symptoms resolved.  Current Outpatient Prescriptions  Medication Sig Dispense Refill  . flecainide (TAMBOCOR) 100 MG tablet Take 1 tablet by mouth twice daily, need to schedule OV 60 tablet 1  . metoprolol tartrate (LOPRESSOR) 25 MG tablet Take 0.5 tablets (12.5 mg total) by mouth 2 (two) times daily. 30 tablet 6   No current facility-administered medications for this visit.     Past Medical History  Diagnosis Date  . Atrial fibrillation (HCC)   . Blood in stool   . Unspecified polyarthropathy or polyarthritis, multiple sites   . Intrinsic asthma, unspecified   . DJD (degenerative joint disease) of lumbar spine   . Hepatitis C   . Substance abuse   . Cardiomyopathy     improved   . Hx of echocardiogram     a. Echo 12/13:  EF 60-65%, mod LAE, mild RAE, PASP 34  . Gout     Past Surgical History  Procedure Laterality Date  . Hernia repair  1966    Social History   Social History  . Marital Status: Married    Spouse Name: N/A  . Number of Children: N/A  . Years of Education: N/A   Occupational History  . Not on file.   Social History Main Topics  . Smoking status: Former Smoker    Quit date: 02/02/2014  . Smokeless tobacco: Not on file     Comment: started in 1999. smoked 1/4 ppd   . Alcohol Use: No     Comment: quit in 2006   .  Drug Use: No     Comment: quit in 2006   . Sexual Activity: Not on file   Other Topics Concern  . Not on file   Social History Narrative   Married; step children; disabled.     Family History  Problem Relation Age of Onset  . Heart failure      CHF - father and mother. (mother also had severe RA)    ROS:  Arthralgias and gout but no fevers or chills, productive cough, hemoptysis, dysphasia, odynophagia, melena, hematochezia, dysuria, hematuria, rash, seizure activity, orthopnea, PND, pedal edema, claudication. Remaining systems are negative.  Physical Exam: Well-developed well-nourished in no acute distress.  Skin is warm and dry.  HEENT is normal.  Neck is supple.  Chest is clear to auscultation with normal expansion.  Cardiovascular exam is regular rate and rhythm.  Abdominal exam nontender or distended. No masses palpated. Extremities show no edema. neuro grossly intact  ECG Sinus rhythm at a rate of 59. No ST changes.

## 2015-03-06 ENCOUNTER — Ambulatory Visit (INDEPENDENT_AMBULATORY_CARE_PROVIDER_SITE_OTHER): Payer: Medicare Other | Admitting: Cardiology

## 2015-03-06 ENCOUNTER — Encounter: Payer: Self-pay | Admitting: Cardiology

## 2015-03-06 VITALS — BP 118/70 | HR 60 | Ht 74.0 in | Wt 176.4 lb

## 2015-03-06 DIAGNOSIS — I4891 Unspecified atrial fibrillation: Secondary | ICD-10-CM | POA: Diagnosis not present

## 2015-03-06 DIAGNOSIS — F17200 Nicotine dependence, unspecified, uncomplicated: Secondary | ICD-10-CM | POA: Diagnosis not present

## 2015-03-06 MED ORDER — FLECAINIDE ACETATE 100 MG PO TABS: ORAL_TABLET | ORAL | Status: DC

## 2015-03-06 MED ORDER — METOPROLOL TARTRATE 25 MG PO TABS: 12.5000 mg | ORAL_TABLET | Freq: Two times a day (BID) | ORAL | Status: DC

## 2015-03-06 NOTE — Assessment & Plan Note (Signed)
Patient states he quit several weeks ago. I reinforced the need to continue off of tobacco.

## 2015-03-06 NOTE — Patient Instructions (Signed)
Your physician wants you to follow-up in: ONE YEAR WITH DR CRENSHAW You will receive a reminder letter in the mail two months in advance. If you don't receive a letter, please call our office to schedule the follow-up appointment.   If you need a refill on your cardiac medications before your next appointment, please call your pharmacy.  

## 2015-03-06 NOTE — Assessment & Plan Note (Signed)
Patient remains in sinus rhythm. He had one brief episode of atrial fibrillation during an episode of vomiting that resolved with an extra flecanide. Continue flecainide and metoprolol. He has no embolic risk factors. We can consider a different antiarrhythmic in the future if atrial fibrillation becomes more frequent.

## 2016-02-21 ENCOUNTER — Other Ambulatory Visit: Payer: Self-pay | Admitting: Cardiology

## 2016-02-21 DIAGNOSIS — I4891 Unspecified atrial fibrillation: Secondary | ICD-10-CM

## 2016-03-04 ENCOUNTER — Encounter: Payer: Self-pay | Admitting: Cardiology

## 2016-03-04 ENCOUNTER — Ambulatory Visit (INDEPENDENT_AMBULATORY_CARE_PROVIDER_SITE_OTHER): Payer: Medicare Other | Admitting: Cardiology

## 2016-03-04 VITALS — BP 121/74 | HR 68 | Ht 74.0 in | Wt 172.0 lb

## 2016-03-04 DIAGNOSIS — I4891 Unspecified atrial fibrillation: Secondary | ICD-10-CM

## 2016-03-04 DIAGNOSIS — R002 Palpitations: Secondary | ICD-10-CM

## 2016-03-04 LAB — CBC
HCT: 40.9 % (ref 38.5–50.0)
Hemoglobin: 13.9 g/dL (ref 13.2–17.1)
MCH: 29.6 pg (ref 27.0–33.0)
MCHC: 34 g/dL (ref 32.0–36.0)
MCV: 87.2 fL (ref 80.0–100.0)
MPV: 9.7 fL (ref 7.5–12.5)
Platelets: 221 10*3/uL (ref 140–400)
RBC: 4.69 MIL/uL (ref 4.20–5.80)
RDW: 14.9 % (ref 11.0–15.0)
WBC: 7.3 10*3/uL (ref 3.8–10.8)

## 2016-03-04 MED ORDER — METOPROLOL TARTRATE 25 MG PO TABS: 25.0000 mg | ORAL_TABLET | Freq: Two times a day (BID) | ORAL | 3 refills | Status: DC

## 2016-03-04 MED ORDER — FLECAINIDE ACETATE 100 MG PO TABS: ORAL_TABLET | ORAL | 11 refills | Status: DC

## 2016-03-04 NOTE — Patient Instructions (Signed)
Medication Instructions:   INCREASE METOPROLOL TO 25 MG TWICE DAILY= 1 TABLET TWICE DAILY  Labwork:  Your physician recommends that you HAVE LAB WORK TODAY  Testing/Procedures:  Your physician has requested that you have an echocardiogram. Echocardiography is a painless test that uses sound waves to create images of your heart. It provides your doctor with information about the size and shape of your heart and how well your heart's chambers and valves are working. This procedure takes approximately one hour. There are no restrictions for this procedure.    Follow-Up:  Your physician wants you to follow-up in: ONE YEAR WITH DR Shelda Pal will receive a reminder letter in the mail two months in advance. If you don't receive a letter, please call our office to schedule the follow-up appointment.   If you need a refill on your cardiac medications before your next appointment, please call your pharmacy.

## 2016-03-04 NOTE — Progress Notes (Signed)
HPI: FU nonischemic cardiomyopathy and atrial fibrillation. Cardiomyopathy was likely tachycardia mediated. LHC 3/08: Normal coronary arteries, EF 50%. Last echocardiogram in December of 2013 showed normal LV function, moderate left atrial enlargement and mild to moderate right atrial enlargement. Patient has been maintained in sinus rhythm on flecainide. Also with history of substance abuse. Since last seen, some dyspnea on exertion. No orthopnea, PND, pedal edema, syncope or chest pain. He does feel occasional skips but no sustained palpitations.  Current Outpatient Prescriptions  Medication Sig Dispense Refill  . aspirin 325 MG tablet Take 325 mg by mouth daily.    . flecainide (TAMBOCOR) 100 MG tablet TAKE ONE TABLET BY MOUTH TWICE DAILY **NEED  TO  SCHEDULE  OFFICE  VISIT** 30 tablet 0  . metoprolol tartrate (LOPRESSOR) 25 MG tablet Take 0.5 tablets (12.5 mg total) by mouth 2 (two) times daily. 90 tablet 3   No current facility-administered medications for this visit.      Past Medical History:  Diagnosis Date  . Atrial fibrillation (HCC)   . Blood in stool   . Cardiomyopathy    improved   . DJD (degenerative joint disease) of lumbar spine   . Gout   . Hepatitis C   . Hx of echocardiogram    a. Echo 12/13:  EF 60-65%, mod LAE, mild RAE, PASP 34  . Intrinsic asthma, unspecified   . Substance abuse   . Unspecified polyarthropathy or polyarthritis, multiple sites     Past Surgical History:  Procedure Laterality Date  . HERNIA REPAIR  1966    Social History   Social History  . Marital status: Married    Spouse name: N/A  . Number of children: N/A  . Years of education: N/A   Occupational History  . Not on file.   Social History Main Topics  . Smoking status: Former Smoker    Quit date: 02/02/2014  . Smokeless tobacco: Never Used     Comment: started in 1999. smoked 1/4 ppd   . Alcohol use No     Comment: quit in 2006   . Drug use: No     Comment: quit in  2006   . Sexual activity: Not on file   Other Topics Concern  . Not on file   Social History Narrative   Married; step children; disabled.     Family History  Problem Relation Age of Onset  . Heart failure      CHF - father and mother. (mother also had severe RA)    ROS: no fevers or chills, productive cough, hemoptysis, dysphasia, odynophagia, melena, hematochezia, dysuria, hematuria, rash, seizure activity, orthopnea, PND, pedal edema, claudication. Remaining systems are negative.  Physical Exam: Well-developed well-nourished in no acute distress.  Skin is warm and dry.  HEENT is normal.  Neck is supple. No bruits  Chest is clear to auscultation with normal expansion.  Cardiovascular exam is regular rate and rhythm.  Abdominal exam nontender or distended. No masses palpated. Extremities show no edema. neuro grossly intact  ECG-sinus rhythm with occasional PAC. No ST changes.  A/P  1 paroxysmal atrial fibrillation-patient remains in sinus rhythm. Continue flecainide and metoprolol. Patient has no embolic risk factors. He is therefore not anticoagulated. If he has more frequent episodes in the future we can consider changing antiarrhythmics.  2 palpitations-patient is describing occasional skips and PACs are noted on electrocardiogram. Increase metoprolol to 25 mg twice a day. He also notes some dyspnea. Repeat echocardiogram.  Check CBC, TSH, potassium and renal function.  3 history of substance abuse-we discussed the importance of avoiding. He denies any at present.  4 primary care needs-I have arranged a primary care physician for routine medical care.  Olga Millers, MD

## 2016-03-05 LAB — TSH: TSH: 8.18 mIU/L — ABNORMAL HIGH (ref 0.40–4.50)

## 2016-03-05 LAB — BASIC METABOLIC PANEL
BUN: 18 mg/dL (ref 7–25)
CO2: 29 mmol/L (ref 20–31)
Calcium: 9.7 mg/dL (ref 8.6–10.3)
Chloride: 104 mmol/L (ref 98–110)
Creat: 1.45 mg/dL — ABNORMAL HIGH (ref 0.70–1.25)
Glucose, Bld: 111 mg/dL — ABNORMAL HIGH (ref 65–99)
Potassium: 4.3 mmol/L (ref 3.5–5.3)
Sodium: 140 mmol/L (ref 135–146)

## 2016-03-16 ENCOUNTER — Encounter: Payer: Self-pay | Admitting: *Deleted

## 2016-03-18 ENCOUNTER — Ambulatory Visit (HOSPITAL_COMMUNITY): Payer: Medicare Other | Attending: Cardiology

## 2016-03-18 ENCOUNTER — Other Ambulatory Visit: Payer: Self-pay

## 2016-03-18 DIAGNOSIS — R002 Palpitations: Secondary | ICD-10-CM | POA: Diagnosis present

## 2016-03-18 DIAGNOSIS — I429 Cardiomyopathy, unspecified: Secondary | ICD-10-CM | POA: Insufficient documentation

## 2016-03-18 DIAGNOSIS — I340 Nonrheumatic mitral (valve) insufficiency: Secondary | ICD-10-CM | POA: Diagnosis not present

## 2016-03-18 DIAGNOSIS — Z87891 Personal history of nicotine dependence: Secondary | ICD-10-CM | POA: Diagnosis not present

## 2016-03-18 DIAGNOSIS — I4891 Unspecified atrial fibrillation: Secondary | ICD-10-CM | POA: Insufficient documentation

## 2016-03-18 DIAGNOSIS — I071 Rheumatic tricuspid insufficiency: Secondary | ICD-10-CM | POA: Diagnosis not present

## 2016-03-18 DIAGNOSIS — B1920 Unspecified viral hepatitis C without hepatic coma: Secondary | ICD-10-CM | POA: Insufficient documentation

## 2016-03-18 DIAGNOSIS — F1910 Other psychoactive substance abuse, uncomplicated: Secondary | ICD-10-CM | POA: Insufficient documentation

## 2016-09-04 ENCOUNTER — Other Ambulatory Visit: Payer: Self-pay | Admitting: Cardiology

## 2016-09-04 DIAGNOSIS — I4891 Unspecified atrial fibrillation: Secondary | ICD-10-CM

## 2017-03-05 ENCOUNTER — Other Ambulatory Visit: Payer: Self-pay | Admitting: Cardiology

## 2017-03-05 DIAGNOSIS — I4891 Unspecified atrial fibrillation: Secondary | ICD-10-CM

## 2017-03-05 NOTE — Telephone Encounter (Signed)
Rx(s) sent to pharmacy electronically.  

## 2017-03-12 ENCOUNTER — Other Ambulatory Visit: Payer: Self-pay | Admitting: Cardiology

## 2017-03-12 DIAGNOSIS — I4891 Unspecified atrial fibrillation: Secondary | ICD-10-CM

## 2017-04-13 NOTE — Progress Notes (Signed)
HPI: FU nonischemic cardiomyopathy and atrial fibrillation. Cardiomyopathy was likely tachycardia mediated. LHC 3/08: Normal coronary arteries, EF 50%. Patient has been maintained in sinus rhythm on flecainide. Also with history of substance abuse. Last echo 2/18 showed normal LV function; mild MR; mild LAE; mild RVE; mild RAE; mild TR; mild pulmonary hypertension. Since last seen, the patient denies any dyspnea on exertion, orthopnea, PND, pedal edema, syncope or chest pain.  Patient states he had one episode of atrial fibrillation in the past year that resolved spontaneously.   Current Outpatient Medications  Medication Sig Dispense Refill  . allopurinol (ZYLOPRIM) 100 MG tablet Take 100 mg by mouth 2 (two) times daily.     Marland Kitchen aspirin 325 MG tablet Take 325 mg by mouth daily.    . flecainide (TAMBOCOR) 100 MG tablet Take 1 tablet (100 mg total) by mouth 2 (two) times daily. 90 tablet 0  . metoprolol tartrate (LOPRESSOR) 25 MG tablet TAKE ONE TABLET BY MOUTH TWICE DAILY 180 tablet 0   No current facility-administered medications for this visit.      Past Medical History:  Diagnosis Date  . Atrial fibrillation (HCC)   . Blood in stool   . Cardiomyopathy    improved   . DJD (degenerative joint disease) of lumbar spine   . Gout   . Hepatitis C   . Hx of echocardiogram    a. Echo 12/13:  EF 60-65%, mod LAE, mild RAE, PASP 34  . Intrinsic asthma, unspecified   . Substance abuse (HCC)   . Unspecified polyarthropathy or polyarthritis, multiple sites     Past Surgical History:  Procedure Laterality Date  . HERNIA REPAIR  1966    Social History   Socioeconomic History  . Marital status: Married    Spouse name: Not on file  . Number of children: Not on file  . Years of education: Not on file  . Highest education level: Not on file  Social Needs  . Financial resource strain: Not on file  . Food insecurity - worry: Not on file  . Food insecurity - inability: Not on file    . Transportation needs - medical: Not on file  . Transportation needs - non-medical: Not on file  Occupational History  . Not on file  Tobacco Use  . Smoking status: Former Smoker    Last attempt to quit: 02/02/2014    Years since quitting: 3.1  . Smokeless tobacco: Never Used  . Tobacco comment: started in 1999. smoked 1/4 ppd   Substance and Sexual Activity  . Alcohol use: No    Alcohol/week: 0.0 oz    Comment: quit in 2006   . Drug use: No    Comment: quit in 2006   . Sexual activity: Not on file  Other Topics Concern  . Not on file  Social History Narrative   Married; step children; disabled.     Family History  Problem Relation Age of Onset  . Heart failure Unknown        CHF - father and mother. (mother also had severe RA)    ROS: no fevers or chills, productive cough, hemoptysis, dysphasia, odynophagia, melena, hematochezia, dysuria, hematuria, rash, seizure activity, orthopnea, PND, pedal edema, claudication. Remaining systems are negative.  Physical Exam: Well-developed well-nourished in no acute distress.  Skin is warm and dry.  HEENT is normal.  Neck is supple.  Chest is clear to auscultation with normal expansion.  Cardiovascular exam is regular rate  and rhythm.  Abdominal exam nontender or distended. No masses palpated. Extremities show no edema. neuro grossly intact  ECG-sinus bradycardia at a rate of 48.  No ST changes.  Personally reviewed  A/P  1 paroxysmal atrial fibrillation-patient continues to do well and remains in sinus rhythm.  He had one episode of atrial fibrillation in the past year by his report.  It resolves spontaneously.  We will continue with flecainide and metoprolol. CHADSvasc 0.  Anticoagulation therefore not indicated.  If patient has more frequent episodes of atrial fibrillation in the future but we will consider a different antiarrhythmic such as tikosyn vs ablation.   2 palpitations-symptoms are reasonably well controlled.   Continue present dose of beta-blocker.  3 history of substance abuse-patient denies any recent use.  Olga Millers, MD

## 2017-04-14 ENCOUNTER — Encounter: Payer: Self-pay | Admitting: Cardiology

## 2017-04-14 ENCOUNTER — Ambulatory Visit (INDEPENDENT_AMBULATORY_CARE_PROVIDER_SITE_OTHER): Payer: Medicare Other | Admitting: Cardiology

## 2017-04-14 VITALS — BP 132/74 | HR 48 | Ht 74.0 in | Wt 178.4 lb

## 2017-04-14 DIAGNOSIS — I480 Paroxysmal atrial fibrillation: Secondary | ICD-10-CM

## 2017-04-14 DIAGNOSIS — R002 Palpitations: Secondary | ICD-10-CM

## 2017-04-14 DIAGNOSIS — I4891 Unspecified atrial fibrillation: Secondary | ICD-10-CM

## 2017-04-14 MED ORDER — METOPROLOL TARTRATE 25 MG PO TABS: 25.0000 mg | ORAL_TABLET | Freq: Two times a day (BID) | ORAL | 3 refills | Status: DC

## 2017-04-14 MED ORDER — FLECAINIDE ACETATE 100 MG PO TABS: 100.0000 mg | ORAL_TABLET | Freq: Two times a day (BID) | ORAL | 3 refills | Status: DC

## 2017-04-14 NOTE — Patient Instructions (Signed)
Medication Instructions:  Your physician recommends that you continue on your current medications as directed. Please refer to the Current Medication list given to you today. 1.  All cardiac medications were refilled at today's visit.   Labwork: -None  Testing/Procedures: -None  Follow-Up: Your physician wants you to follow-up in: 1 year with Dr.Crenshaw.  You will receive a reminder letter in the mail two months in advance. If you don't receive a letter, please call our office to schedule the follow-up appointment.   Any Other Special Instructions Will Be Listed Below (If Applicable).     If you need a refill on your cardiac medications before your next appointment, please call your pharmacy.

## 2017-04-28 ENCOUNTER — Ambulatory Visit: Payer: Medicare Other | Admitting: Cardiology

## 2018-04-09 ENCOUNTER — Other Ambulatory Visit: Payer: Self-pay | Admitting: Cardiology

## 2018-04-09 DIAGNOSIS — I4891 Unspecified atrial fibrillation: Secondary | ICD-10-CM

## 2018-04-12 NOTE — Telephone Encounter (Signed)
Rx(s) sent to pharmacy electronically.  

## 2018-04-28 ENCOUNTER — Telehealth: Payer: Self-pay

## 2018-04-28 NOTE — Telephone Encounter (Signed)
Left message for patient to contact office.

## 2018-04-29 NOTE — Telephone Encounter (Signed)
Unable to contact patient. Left message for patient to contact office.

## 2018-04-29 NOTE — Telephone Encounter (Signed)
REVIEWING SCHEDULE I NOTICED PATIENT HAS CALLED BACK AND CANCELED HIS APPOINTMENT.

## 2018-05-02 ENCOUNTER — Telehealth: Payer: Medicare Other | Admitting: Cardiology

## 2018-06-03 ENCOUNTER — Other Ambulatory Visit: Payer: Self-pay | Admitting: Cardiology

## 2018-06-03 DIAGNOSIS — I4891 Unspecified atrial fibrillation: Secondary | ICD-10-CM

## 2018-06-04 NOTE — Telephone Encounter (Signed)
Lopressor 25 mg refilled 

## 2018-07-08 ENCOUNTER — Other Ambulatory Visit: Payer: Self-pay | Admitting: Cardiology

## 2018-07-08 DIAGNOSIS — I4891 Unspecified atrial fibrillation: Secondary | ICD-10-CM

## 2018-09-05 ENCOUNTER — Other Ambulatory Visit: Payer: Self-pay | Admitting: Cardiology

## 2018-09-05 DIAGNOSIS — I4891 Unspecified atrial fibrillation: Secondary | ICD-10-CM

## 2018-10-12 ENCOUNTER — Ambulatory Visit (INDEPENDENT_AMBULATORY_CARE_PROVIDER_SITE_OTHER): Payer: Medicare Other | Admitting: Cardiology

## 2018-10-12 ENCOUNTER — Encounter: Payer: Self-pay | Admitting: Cardiology

## 2018-10-12 ENCOUNTER — Other Ambulatory Visit: Payer: Self-pay

## 2018-10-12 VITALS — BP 133/70 | HR 47 | Ht 74.0 in | Wt 166.1 lb

## 2018-10-12 DIAGNOSIS — R072 Precordial pain: Secondary | ICD-10-CM

## 2018-10-12 DIAGNOSIS — I4891 Unspecified atrial fibrillation: Secondary | ICD-10-CM | POA: Diagnosis not present

## 2018-10-12 DIAGNOSIS — R002 Palpitations: Secondary | ICD-10-CM

## 2018-10-12 MED ORDER — FLECAINIDE ACETATE 100 MG PO TABS: 100.0000 mg | ORAL_TABLET | Freq: Two times a day (BID) | ORAL | 3 refills | Status: DC

## 2018-10-12 MED ORDER — METOPROLOL TARTRATE 25 MG PO TABS: 25.0000 mg | ORAL_TABLET | Freq: Two times a day (BID) | ORAL | 3 refills | Status: DC

## 2018-10-12 NOTE — Progress Notes (Signed)
HPI: FU nonischemic cardiomyopathy and atrial fibrillation. Cardiomyopathy was likely tachycardia mediated. LHC 3/08: Normal coronary arteries, EF 50%. Patient has been maintained in sinus rhythm on flecainide. Also with history of substance abuse. Last echo 2/18 showed normal LV function; mild MR; mild LAE; mild RVE; mild RAE; mild TR; mild pulmonary hypertension. Since last seen,there is no dyspnea, pedal edema or syncope.  Occasional brief flutter but no sustained palpitations.  He occasionally feels a pain in his neck that radiates to his chest that is not exertional.  Resolves with aspirin.  He has had this intermittently for years and it is unchanged by his report.  Current Outpatient Medications  Medication Sig Dispense Refill  . allopurinol (ZYLOPRIM) 100 MG tablet Take 100 mg by mouth 2 (two) times daily.     Marland Kitchen aspirin 325 MG tablet Take 325 mg by mouth daily.    . flecainide (TAMBOCOR) 100 MG tablet Take 1 tablet (100 mg total) by mouth 2 (two) times daily. Must keep 10/12/18 OV 180 tablet 0  . metoprolol tartrate (LOPRESSOR) 25 MG tablet Take 1 tablet by mouth twice daily 180 tablet 0   No current facility-administered medications for this visit.      Past Medical History:  Diagnosis Date  . ATRIAL FIBRILLATION 06/16/2008   Qualifier: Diagnosis of  By: Mare Ferrari, [place], [name]    . Atrial fibrillation ([place])   . Blood in stool   . Cardiomyopathy    improved   . DJD (degenerative joint disease) of lumbar spine   . Gout   . Hepatitis C   . Hx of echocardiogram    a. Echo 12/13:  EF 60-65%, mod LAE, mild RAE, PASP 34  . Intrinsic asthma, unspecified   . Substance abuse ([place])   . Unspecified polyarthropathy or polyarthritis, multiple sites     Past Surgical History:  Procedure Laterality Date  . HERNIA REPAIR  1966    Social History   Socioeconomic History  . Marital status: Married    Spouse name: Not on file  . Number of children: Not on file  . Years of  education: Not on file  . Highest education level: Not on file  Occupational History  . Not on file  Social Needs  . Financial resource strain: Not on file  . Food insecurity    Worry: Not on file    Inability: Not on file  . Transportation needs    Medical: Not on file    Non-medical: Not on file  Tobacco Use  . Smoking status: Former Smoker    Quit date: 02/02/2014    Years since quitting: 4.6  . Smokeless tobacco: Never Used  . Tobacco comment: started in 1999. smoked 1/4 ppd   Substance and Sexual Activity  . Alcohol use: No    Alcohol/week: 0.0 standard drinks    Comment: quit in 2006   . Drug use: No    Comment: quit in 2006   . Sexual activity: Not on file  Lifestyle  . Physical activity    Days per week: Not on file    Minutes per session: Not on file  . Stress: Not on file  Relationships  . Social Herbalist on phone: Not on file    Gets together: Not on file    Attends religious service: Not on file    Active member of club or organization: Not on file    Attends meetings of clubs or  organizations: Not on file    Relationship status: Not on file  . Intimate partner violence    Fear of current or ex partner: Not on file    Emotionally abused: Not on file    Physically abused: Not on file    Forced sexual activity: Not on file  Other Topics Concern  . Not on file  Social History Narrative   Married; step children; disabled.     Family History  Problem Relation Age of Onset  . Heart failure Other        CHF - father and mother. (mother also had severe RA)    ROS: no fevers or chills, productive cough, hemoptysis, dysphasia, odynophagia, melena, hematochezia, dysuria, hematuria, rash, seizure activity, orthopnea, PND, pedal edema, claudication. Remaining systems are negative.  Physical Exam: Well-developed well-nourished in no acute distress.  Skin is warm and dry.  HEENT is normal.  Neck is supple.  Chest is clear to auscultation with  normal expansion.  Cardiovascular exam is regular rate and rhythm.  Abdominal exam nontender or distended. No masses palpated. Extremities show no edema. neuro grossly intact  ECG-sinus bradycardia at a rate of 47, no ST changes.  Personally reviewed  A/P  1 paroxysmal atrial fibrillation-patient has had no recurrences by his report.  Continue flecainide and metoprolol. CHADSvasc 0.  Continue aspirin but decrease to 81 mg daily.  We have therefore not anticoagulated.  We can consider Tikosyn versus referral for ablation if he has increased frequency of episodes in the future.  2 palpitations-continue beta-blocker.  3 chest pain-occasional brief chest pain not exertional.  He states this has been intermittent for years.  Electrocardiogram shows no ST changes.  We will not pursue further ischemia evaluation.  4 substance abuse-patient has a remote history of substance abuse but denies any recent use.  Olga Millers, MD

## 2018-10-12 NOTE — Patient Instructions (Addendum)
Your physician has recommended you make the following change in your medication:  DECREASE ASPIRIN TO 46 MG EVERY DAY   Your physician wants you to follow-up in: [place] will receive a reminder letter in the mail two months in advance. If you don't receive a letter, please call our office to schedule the follow-up appointment.

## 2019-09-04 ENCOUNTER — Other Ambulatory Visit: Payer: Self-pay | Admitting: Cardiology

## 2019-09-04 DIAGNOSIS — I4891 Unspecified atrial fibrillation: Secondary | ICD-10-CM

## 2019-09-06 ENCOUNTER — Other Ambulatory Visit: Payer: Self-pay

## 2019-09-06 DIAGNOSIS — I4891 Unspecified atrial fibrillation: Secondary | ICD-10-CM

## 2019-09-06 MED ORDER — FLECAINIDE ACETATE 100 MG PO TABS: 100.0000 mg | ORAL_TABLET | Freq: Two times a day (BID) | ORAL | 0 refills | Status: DC

## 2019-11-16 ENCOUNTER — Other Ambulatory Visit: Payer: Self-pay | Admitting: Cardiology

## 2019-11-16 DIAGNOSIS — I4891 Unspecified atrial fibrillation: Secondary | ICD-10-CM

## 2019-11-23 ENCOUNTER — Telehealth: Payer: Self-pay | Admitting: Cardiology

## 2019-11-23 NOTE — Telephone Encounter (Signed)
Patient c/o Palpitations:  High priority if patient c/o lightheadedness, shortness of breath, or chest pain  1) How long have you had palpitations/irregular HR/ Afib? Are you having the symptoms now? Has been having palpitations since 3 am Tuesday, is having symptoms now.   2) Are you currently experiencing lightheadedness, SOB or CP? Slightly lightheaded, but states nothing serious   3) Do you have a history of afib (atrial fibrillation) or irregular heart rhythm? Yes   4) Have you checked your BP or HR? (document readings if available): HR 90-100  5) Are you experiencing any other symptoms? No  States he normally takes extra meds, but that has not helped.

## 2019-11-23 NOTE — Telephone Encounter (Signed)
Patient called in concerned about palpitations. He stated that since Tuesday he feels like he has been in atrial fibrillation with heart rates in the high 90's. He stated that he is normally in the 50's.  He currently takes Metoprolol 25 mg bid and Flecainide 100 mg bid. He has been taking "3-4" of them both since Tuesday with no changes. He is unable to check his blood pressure.   He denies chest pain and shortness of breath. He stated that he "feels funny and not right."   Appointment made with the atrial fibrillation clinic at 9 am 10/22. The patient has been made aware and parking deck code given. He has been advised to go to the ED if symptoms worsen.

## 2019-11-24 ENCOUNTER — Inpatient Hospital Stay (HOSPITAL_COMMUNITY)
Admission: RE | Admit: 2019-11-24 | Discharge: 2019-11-24 | Disposition: A | Discharge status: Home / Self Care | Payer: Medicare Other | Source: Ambulatory Visit | Attending: Physician Assistant | Admitting: Physician Assistant

## 2019-11-24 NOTE — Telephone Encounter (Signed)
Patient called in stating he converted into NSR overnight feeling great. Did not feel appointment today was needed but will call if burden increases. Pt appreciative.

## 2019-12-27 ENCOUNTER — Other Ambulatory Visit: Payer: Self-pay | Admitting: Cardiology

## 2019-12-27 DIAGNOSIS — I4891 Unspecified atrial fibrillation: Secondary | ICD-10-CM

## 2019-12-29 ENCOUNTER — Other Ambulatory Visit: Payer: Self-pay | Admitting: Cardiology

## 2019-12-29 DIAGNOSIS — I4891 Unspecified atrial fibrillation: Secondary | ICD-10-CM

## 2020-02-22 NOTE — Progress Notes (Signed)
HPI: FU nonischemic cardiomyopathy and atrial fibrillation. Cardiomyopathy was likely tachycardia mediated. LHC 3/08: Normal coronary arteries, EF 50%. Patient has been maintained in sinus rhythm on flecainide. Also with history of substance abuse.Last echo 2/18 showed normal LV function; mild MR; mild LAE; mild RVE; mild RAE; mild TR; mild pulmonary hypertension.Since last seen, patient had one episode of heart skipping/fluttering for 3 days.  He contacted the office and was scheduled for the atrial fibrillation clinic but he converted the night prior to.  That is the only episode of atrial fibrillation that he has had by report.  He otherwise has not had increased dyspnea on exertion, orthopnea, PND, pedal edema, exertional chest pain or syncope.  No bleeding.  He does not fall.  He denies any alcohol or drug use.  Current Outpatient Medications  Medication Sig Dispense Refill  . allopurinol (ZYLOPRIM) 100 MG tablet Take 100 mg by mouth 2 (two) times daily.     Marland Kitchen aspirin EC 81 MG tablet Take 81 mg by mouth daily. Swallow whole.    . flecainide (TAMBOCOR) 100 MG tablet Take 1 tablet (100 mg total) by mouth 2 (two) times daily. 180 tablet 0  . metoprolol tartrate (LOPRESSOR) 25 MG tablet Take 1 tablet (25 mg total) by mouth 2 (two) times daily. 180 tablet 0   No current facility-administered medications for this visit.     Past Medical History:  Diagnosis Date  . ATRIAL FIBRILLATION 06/16/2008   Qualifier: Diagnosis of  By: [name], RMA, [name]    . Atrial fibrillation ([facility])   . Blood in stool   . Cardiomyopathy    improved   . DJD (degenerative joint disease) of lumbar spine   . Gout   . Hepatitis C   . Hx of echocardiogram    a. Echo 12/13:  EF 60-65%, mod LAE, mild RAE, PASP 34  . Intrinsic asthma, unspecified   . Substance abuse ([facility])   . Unspecified polyarthropathy or polyarthritis, multiple sites     Past Surgical History:  Procedure Laterality Date  . HERNIA REPAIR   1966    Social History   Socioeconomic History  . Marital status: Married    Spouse name: Not on file  . Number of children: Not on file  . Years of education: Not on file  . Highest education level: Not on file  Occupational History  . Not on file  Tobacco Use  . Smoking status: Former Smoker    Quit date: 02/02/2014    Years since quitting: 6.0  . Smokeless tobacco: Never Used  . Tobacco comment: started in 1999. smoked 1/4 ppd   Substance and Sexual Activity  . Alcohol use: No    Alcohol/week: 0.0 standard drinks    Comment: quit in 2006   . Drug use: No    Comment: quit in 2006   . Sexual activity: Not on file  Other Topics Concern  . Not on file  Social History Narrative   Married; step children; disabled.    Social Determinants of Health   Financial Resource Strain: Not on file  Food Insecurity: Not on file  Transportation Needs: Not on file  Physical Activity: Not on file  Stress: Not on file  Social Connections: Not on file  Intimate Partner Violence: Not on file    Family History  Problem Relation Age of Onset  . Heart failure Other        CHF - father and mother. (mother also  had severe RA)    ROS: no fevers or chills, productive cough, hemoptysis, dysphasia, odynophagia, melena, hematochezia, dysuria, hematuria, rash, seizure activity, orthopnea, PND, pedal edema, claudication. Remaining systems are negative.  Physical Exam: Well-developed well-nourished in no acute distress.  Skin is warm and dry.  HEENT is normal.  Neck is supple.  Chest is clear to auscultation with normal expansion.  Cardiovascular exam is regular rate and rhythm.  Abdominal exam nontender or distended. No masses palpated.  Positive bruit Extremities show no edema. neuro grossly intact  ECG-sinus bradycardia at a rate of 46, left ventricular hypertrophy, first-degree AV block.  Personally reviewed  A/P  1 paroxysmal atrial fibrillation-patient had an episode of atrial  fibrillation in October by his report but no other episodes.  We will continue flecainide.  Decrease metoprolol to 12.5 mg twice daily given bradycardia.  He has not used drugs or alcohol for years by his report.  He also now has embolic risk factors of hypertension and will be 65 in March.  I will therefore discontinue aspirin and treat with apixaban 5 mg twice daily.  Check hemoglobin and renal function in 1 week.  Repeat echocardiogram.  2 palpitations-we will continue beta-blockade but decrease to 12.5 mg twice daily due to bradycardia.  3 history of chest pain-no recurrences.  No plans for further ischemia evaluation.  4 substance abuse-remote history and he denies any recurrence use.  5 hypertension-patient's blood pressure is elevated.  I am decreasing metoprolol due to bradycardia.  Add losartan 50 mg daily.  In 1 week check potassium and renal function.  Follow blood pressure and adjust regimen as needed.  6 bruit-schedule abdominal ultrasound to exclude aneurysm.  Olga Millers, MD

## 2020-02-28 ENCOUNTER — Other Ambulatory Visit: Payer: Self-pay

## 2020-02-28 ENCOUNTER — Ambulatory Visit (INDEPENDENT_AMBULATORY_CARE_PROVIDER_SITE_OTHER): Payer: Medicare Other | Admitting: Cardiology

## 2020-02-28 ENCOUNTER — Encounter: Payer: Self-pay | Admitting: Cardiology

## 2020-02-28 VITALS — BP 159/81 | HR 51 | Ht 74.0 in | Wt 170.2 lb

## 2020-02-28 DIAGNOSIS — I4891 Unspecified atrial fibrillation: Secondary | ICD-10-CM | POA: Diagnosis not present

## 2020-02-28 DIAGNOSIS — R002 Palpitations: Secondary | ICD-10-CM

## 2020-02-28 DIAGNOSIS — I10 Essential (primary) hypertension: Secondary | ICD-10-CM

## 2020-02-28 DIAGNOSIS — R072 Precordial pain: Secondary | ICD-10-CM | POA: Diagnosis not present

## 2020-02-28 DIAGNOSIS — R0989 Other specified symptoms and signs involving the circulatory and respiratory systems: Secondary | ICD-10-CM

## 2020-02-28 MED ORDER — LOSARTAN POTASSIUM 50 MG PO TABS: 50.0000 mg | ORAL_TABLET | Freq: Every day | ORAL | 3 refills | Status: DC

## 2020-02-28 MED ORDER — FLECAINIDE ACETATE 100 MG PO TABS: 100.0000 mg | ORAL_TABLET | Freq: Two times a day (BID) | ORAL | 3 refills | Status: DC

## 2020-02-28 MED ORDER — METOPROLOL TARTRATE 25 MG PO TABS: 12.5000 mg | ORAL_TABLET | Freq: Two times a day (BID) | ORAL | 3 refills | Status: DC

## 2020-02-28 MED ORDER — APIXABAN 5 MG PO TABS: 5.0000 mg | ORAL_TABLET | Freq: Two times a day (BID) | ORAL | 12 refills | Status: DC

## 2020-02-28 NOTE — Patient Instructions (Signed)
Medication Instructions:   DECREASE METOPROLOL TO 12.5 MG TWICE DAILY= 1/2 OF THE 25 MG TABLET TWICE DAILY  STOP ASPIRIN  START ELIQUIS 5 MG TWICE DAILY  START LOSARTAN 50 MG ONCE DAILY  *If you need a refill on your cardiac medications before your next appointment, please call your pharmacy*   Lab Work:  Your physician recommends that you return for lab work in: ONE WEEK  If you have labs (blood work) drawn today and your tests are completely normal, you will receive your results only by: Marland Kitchen MyChart Message (if you have MyChart) OR . A paper copy in the mail If you have any lab test that is abnormal or we need to change your treatment, we will call you to review the results.   Testing/Procedures:  Your physician has requested that you have an echocardiogram. Echocardiography is a painless test that uses sound waves to create images of your heart. It provides your doctor with information about the size and shape of your heart and how well your heart's chambers and valves are working. This procedure takes approximately one hour. There are no restrictions for this procedure.[address], [place]-1ST FLOOR IMAGING DEPARTMENT   Your physician has requested that you have an abdominal aorta duplex. During this test, an ultrasound is used to evaluate the aorta. Allow 30 minutes for this exam. Do not eat after midnight the day before and avoid carbonated beverages DUE IN April  Follow-Up: At [facility], you and your health needs are our priority.  As part of our continuing mission to provide you with exceptional heart care, we have created designated Provider Care Teams.  These Care Teams include your primary Cardiologist (physician) and Advanced Practice Providers (APPs -  Physician Assistants and Nurse Practitioners) who all work together to provide you with the care you need, when you need it.  We recommend signing up for the patient portal called "MyChart".  Sign up  information is provided on this After Visit Summary.  MyChart is used to connect with patients for Virtual Visits (Telemedicine).  Patients are able to view lab/test results, encounter notes, upcoming appointments, etc.  Non-urgent messages can be sent to your provider as well.   To learn more about what you can do with MyChart, go to [email or web address].    Your next appointment:   6 month(s)  The format for your next appointment:   In Person  Provider:   Olga Millers, MD

## 2020-03-12 LAB — BASIC METABOLIC PANEL
BUN: 20 mg/dL (ref 7–25)
CO2: 32 mmol/L (ref 20–32)
Calcium: 9.6 mg/dL (ref 8.6–10.3)
Chloride: 103 mmol/L (ref 98–110)
Creat: 1.04 mg/dL (ref 0.70–1.25)
Glucose, Bld: 88 mg/dL (ref 65–99)
Potassium: 4.2 mmol/L (ref 3.5–5.3)
Sodium: 140 mmol/L (ref 135–146)

## 2020-03-12 LAB — CBC
HCT: 41.2 % (ref 38.5–50.0)
Hemoglobin: 14.2 g/dL (ref 13.2–17.1)
MCH: 30.9 pg (ref 27.0–33.0)
MCHC: 34.5 g/dL (ref 32.0–36.0)
MCV: 89.6 fL (ref 80.0–100.0)
MPV: 10.5 fL (ref 7.5–12.5)
Platelets: 164 10*3/uL (ref 140–400)
RBC: 4.6 10*6/uL (ref 4.20–5.80)
RDW: 13.1 % (ref 11.0–15.0)
WBC: 4.7 10*3/uL (ref 3.8–10.8)

## 2020-03-22 ENCOUNTER — Ambulatory Visit (HOSPITAL_BASED_OUTPATIENT_CLINIC_OR_DEPARTMENT_OTHER)
Admission: RE | Admit: 2020-03-22 | Discharge: 2020-03-22 | Disposition: A | Discharge status: Home / Self Care | Payer: Medicare Other | Source: Ambulatory Visit | Attending: Cardiology | Admitting: Cardiology

## 2020-03-22 ENCOUNTER — Other Ambulatory Visit: Payer: Self-pay

## 2020-03-22 DIAGNOSIS — I4891 Unspecified atrial fibrillation: Secondary | ICD-10-CM | POA: Insufficient documentation

## 2020-03-22 LAB — ECHOCARDIOGRAM COMPLETE
AR max vel: 3.02 cm2
AV Area VTI: 2.86 cm2
AV Area mean vel: 3.02 cm2
AV Mean grad: 3 mmHg
AV Peak grad: 6.5 mmHg
Ao pk vel: 1.27 m/s
Area-P 1/2: 5.88 cm2
Calc EF: -14.4 %
MV M vel: 5.39 m/s
MV Peak grad: 116.2 mmHg
S' Lateral: 2.89 cm
Single Plane A2C EF: 44.2 %
Single Plane A4C EF: 2.1 %

## 2020-05-02 ENCOUNTER — Other Ambulatory Visit: Payer: Self-pay | Admitting: *Deleted

## 2020-05-02 ENCOUNTER — Encounter: Payer: Self-pay | Admitting: *Deleted

## 2020-05-02 DIAGNOSIS — Z87891 Personal history of nicotine dependence: Secondary | ICD-10-CM

## 2020-05-02 DIAGNOSIS — R0989 Other specified symptoms and signs involving the circulatory and respiratory systems: Secondary | ICD-10-CM

## 2020-05-21 NOTE — Telephone Encounter (Signed)
Ok to DC losartan and follow BP  Scott Wagner

## 2020-05-23 ENCOUNTER — Ambulatory Visit (HOSPITAL_COMMUNITY)
Admission: RE | Admit: 2020-05-23 | Discharge: 2020-05-23 | Disposition: A | Discharge status: Home / Self Care | Payer: Medicare Other | Source: Ambulatory Visit | Attending: Cardiology | Admitting: Cardiology

## 2020-05-23 ENCOUNTER — Other Ambulatory Visit: Payer: Self-pay

## 2020-05-23 ENCOUNTER — Encounter: Payer: Self-pay | Admitting: *Deleted

## 2020-05-23 DIAGNOSIS — R0989 Other specified symptoms and signs involving the circulatory and respiratory systems: Secondary | ICD-10-CM | POA: Diagnosis present

## 2020-05-23 DIAGNOSIS — Z87891 Personal history of nicotine dependence: Secondary | ICD-10-CM | POA: Insufficient documentation

## 2020-05-23 DIAGNOSIS — Z136 Encounter for screening for cardiovascular disorders: Secondary | ICD-10-CM | POA: Diagnosis not present

## 2020-06-12 DIAGNOSIS — I4891 Unspecified atrial fibrillation: Secondary | ICD-10-CM

## 2020-06-17 ENCOUNTER — Encounter (HOSPITAL_BASED_OUTPATIENT_CLINIC_OR_DEPARTMENT_OTHER): Payer: Medicare Other

## 2020-06-19 ENCOUNTER — Ambulatory Visit (HOSPITAL_COMMUNITY): Payer: Medicare Other | Admitting: Physician Assistant

## 2020-06-26 MED ORDER — METOPROLOL TARTRATE 25 MG PO TABS: 12.5000 mg | ORAL_TABLET | Freq: Two times a day (BID) | ORAL | 3 refills | Status: DC

## 2020-06-26 NOTE — Addendum Note (Signed)
Addended by: Freddi Starr on: 06/26/2020 01:52 PM   Modules accepted: Orders

## 2020-06-28 ENCOUNTER — Other Ambulatory Visit: Payer: Self-pay

## 2020-06-28 ENCOUNTER — Encounter (HOSPITAL_COMMUNITY): Payer: Self-pay | Admitting: Nurse Practitioner

## 2020-06-28 ENCOUNTER — Ambulatory Visit (HOSPITAL_COMMUNITY)
Admission: RE | Admit: 2020-06-28 | Discharge: 2020-06-28 | Disposition: A | Discharge status: Home / Self Care | Payer: Medicare Other | Source: Ambulatory Visit | Attending: Physician Assistant | Admitting: Physician Assistant

## 2020-06-28 VITALS — BP 158/86 | HR 56 | Ht 74.0 in | Wt 170.0 lb

## 2020-06-28 DIAGNOSIS — Z79899 Other long term (current) drug therapy: Secondary | ICD-10-CM | POA: Diagnosis not present

## 2020-06-28 DIAGNOSIS — Z7901 Long term (current) use of anticoagulants: Secondary | ICD-10-CM | POA: Insufficient documentation

## 2020-06-28 DIAGNOSIS — Z87891 Personal history of nicotine dependence: Secondary | ICD-10-CM | POA: Diagnosis not present

## 2020-06-28 DIAGNOSIS — I4891 Unspecified atrial fibrillation: Secondary | ICD-10-CM | POA: Diagnosis not present

## 2020-06-28 DIAGNOSIS — I4892 Unspecified atrial flutter: Secondary | ICD-10-CM | POA: Diagnosis not present

## 2020-06-28 DIAGNOSIS — I484 Atypical atrial flutter: Secondary | ICD-10-CM | POA: Diagnosis not present

## 2020-06-28 DIAGNOSIS — I4819 Other persistent atrial fibrillation: Secondary | ICD-10-CM | POA: Insufficient documentation

## 2020-06-28 LAB — CBC
HCT: 43 % (ref 39.0–52.0)
Hemoglobin: 14.5 g/dL (ref 13.0–17.0)
MCH: 31.4 pg (ref 26.0–34.0)
MCHC: 33.7 g/dL (ref 30.0–36.0)
MCV: 93.1 fL (ref 80.0–100.0)
Platelets: 188 10*3/uL (ref 150–400)
RBC: 4.62 MIL/uL (ref 4.22–5.81)
RDW: 13.6 % (ref 11.5–15.5)
WBC: 4.9 10*3/uL (ref 4.0–10.5)
nRBC: 0 % (ref 0.0–0.2)

## 2020-06-28 LAB — COMPREHENSIVE METABOLIC PANEL WITH GFR
ALT: 54 U/L — ABNORMAL HIGH (ref 0–44)
AST: 49 U/L — ABNORMAL HIGH (ref 15–41)
Albumin: 3.7 g/dL (ref 3.5–5.0)
Alkaline Phosphatase: 75 U/L (ref 38–126)
Anion gap: 5 (ref 5–15)
BUN: 18 mg/dL (ref 8–23)
CO2: 28 mmol/L (ref 22–32)
Calcium: 9.4 mg/dL (ref 8.9–10.3)
Chloride: 107 mmol/L (ref 98–111)
Creatinine, Ser: 1.14 mg/dL (ref 0.61–1.24)
GFR, Estimated: >60 mL/min
Glucose, Bld: 103 mg/dL — ABNORMAL HIGH (ref 70–99)
Potassium: 5.2 mmol/L — ABNORMAL HIGH (ref 3.5–5.1)
Sodium: 140 mmol/L (ref 135–145)
Total Bilirubin: 0.9 mg/dL (ref 0.3–1.2)
Total Protein: 7 g/dL (ref 6.5–8.1)

## 2020-06-28 LAB — TSH: TSH: 1.924 u[IU]/mL (ref 0.350–4.500)

## 2020-06-28 MED ORDER — ELIQUIS 5 MG PO TABS: 5.0000 mg | ORAL_TABLET | Freq: Two times a day (BID) | ORAL | 2 refills | Status: DC

## 2020-06-28 NOTE — Progress Notes (Signed)
Primary Care Physician: Alferd Apa, MD Referring Physician:Dr. Virlan Kempker is a 65 y.o. male with a h/o paroxysmal afib that has been managed well in the past with flecainide and metoprolol. His metoprolol dose was lowered earlier in the year for bradycardia in SR. He has had 4 episodes since the dose was lowered and this last episode has been persistent since last week. EKG shows atrial flutter with variable rate, with v rates in the 50's. Highest v rates have been in 80-90 bpm with exertion. He had a NICM when first dx with afib in 2008. LHC at that time showed normal coronary arteries and EF of 50%. Lat echo 03/2020, showed normal EF with severely dilated left atrium with a diameter of 4.70 and volume of 185 ml.   He denies any alcohol, tobacco, substance abuse,  excessive caffeine. He denies snoring unless he is on his back. No prior sleep study.   He has a CHA2DS2VASc score of 2(HTN, age). Dr. Jens Som started him on eliquis 5 mg bid several months ago but the pt states he did not start it as he was afraid of bleeding side effects. He states he has had 5 cardioversion's in the past, as well as some were TEE guided. He would like to have a CV asap. He voices exertional dyspnea, fatigue with being out of rhythm.    Today, he denies symptoms of palpitations, chest pain, shortness of breath, orthopnea, PND, lower extremity edema, dizziness, presyncope, syncope, or neurologic sequela. The patient is tolerating medications without difficulties and is otherwise without complaint today.   Past Medical History:  Diagnosis Date  . ATRIAL FIBRILLATION 06/16/2008   Qualifier: Diagnosis of  By: [name], RMA, [name]    . Atrial fibrillation ([facility])   . Blood in stool   . Cardiomyopathy    improved   . DJD (degenerative joint disease) of lumbar spine   . Gout   . Hepatitis C   . Hx of echocardiogram    a. Echo 12/13:  EF 60-65%, mod LAE, mild RAE, PASP 34  . Intrinsic asthma,  unspecified   . Substance abuse ([facility])   . Unspecified polyarthropathy or polyarthritis, multiple sites    Past Surgical History:  Procedure Laterality Date  . HERNIA REPAIR  1966    Current Outpatient Medications  Medication Sig Dispense Refill  . albuterol (VENTOLIN HFA) 108 (90 Base) MCG/ACT inhaler Inhale 2 puffs into the lungs every 6 (six) hours as needed.    Marland Kitchen allopurinol (ZYLOPRIM) 100 MG tablet Take 100 mg by mouth 2 (two) times daily.     . flecainide (TAMBOCOR) 100 MG tablet Take 1 tablet (100 mg total) by mouth 2 (two) times daily. 180 tablet 3  . metoprolol tartrate (LOPRESSOR) 25 MG tablet Take 0.5 tablets (12.5 mg total) by mouth 2 (two) times daily. Take extra 1/2 tablet as needed 90 tablet 3   No current facility-administered medications for this encounter.    Allergies  Allergen Reactions  . Indomethacin Other (See Comments)    Headaches and tired    Social History   Socioeconomic History  . Marital status: Married    Spouse name: Not on file  . Number of children: Not on file  . Years of education: Not on file  . Highest education level: Not on file  Occupational History  . Not on file  Tobacco Use  . Smoking status: Former Smoker    Quit date: 02/02/2014  Years since quitting: 6.4  . Smokeless tobacco: Never Used  . Tobacco comment: started in 1999. smoked 1/4 ppd   Substance and Sexual Activity  . Alcohol use: No    Alcohol/week: 0.0 standard drinks    Comment: quit in 2006   . Drug use: No    Comment: quit in 2006   . Sexual activity: Not on file  Other Topics Concern  . Not on file  Social History Narrative   Married; step children; disabled.    Social Determinants of Health   Financial Resource Strain: Not on file  Food Insecurity: Not on file  Transportation Needs: Not on file  Physical Activity: Not on file  Stress: Not on file  Social Connections: Not on file  Intimate Partner Violence: Not on file    Family History  Problem  Relation Age of Onset  . Heart failure Other        CHF - father and mother. (mother also had severe RA)    ROS- All systems are reviewed and negative except as per the HPI above  Physical Exam: Vitals:   06/28/20 0839  Weight: 77.1 kg  Height: 6\' 2"  (1.88 m)   Wt Readings from Last 3 Encounters:  06/28/20 77.1 kg  02/28/20 77.2 kg  10/12/18 75.4 kg    Labs: Lab Results  Component Value Date   NA 140 03/11/2020   K 4.2 03/11/2020   CL 103 03/11/2020   CO2 32 03/11/2020   GLUCOSE 88 03/11/2020   BUN 20 03/11/2020   CREATININE 1.04 03/11/2020   CALCIUM 9.6 03/11/2020   MG 2.0 08/18/2007   Lab Results  Component Value Date   INR 2.3 10/01/2008   Lab Results  Component Value Date   CHOL  09/15/2008    170        ATP III CLASSIFICATION:  <200     mg/dL   Desirable  09/17/2008  mg/dL   Borderline High  697-948    mg/dL   High          HDL 38 (L) 09/15/2008   LDLCALC (H) 09/15/2008    117        Total Cholesterol/HDL:CHD Risk Coronary Heart Disease Risk Table                     Men   Women  1/2 Average Risk   3.4   3.3  Average Risk       5.0   4.4  2 X Average Risk   9.6   7.1  3 X Average Risk  23.4   11.0        Use the calculated Patient Ratio above and the CHD Risk Table to determine the patient's CHD Risk.        ATP III CLASSIFICATION (LDL):  <100     mg/dL   Optimal  09/17/2008  mg/dL   Near or Above                    Optimal  130-159  mg/dL   Borderline  553-748  mg/dL   High  270-786     mg/dL   Very High   TRIG 77 >754     GEN- The patient is well appearing, alert and oriented x 3 today.   Head- normocephalic, atraumatic Eyes-  Sclera clear, conjunctiva pink Ears- hearing intact Oropharynx- clear Neck- supple, no JVP Lymph- no cervical lymphadenopathy Lungs- Clear to ausculation bilaterally,  normal work of breathing Heart- slow irregular rate and rhythm, no murmurs, rubs or gallops, PMI not laterally displaced GI- soft, NT, ND, +  BS Extremities- no clubbing, cyanosis, or edema MS- no significant deformity or atrophy Skin- no rash or lesion Psych- euthymic mood, full affect Neuro- strength and sensation are intact  EKG- atrial flutter at 56 bpm, qrs int 122 ms, qtc 424 ms  Echo- FINDINGS  Left Ventricle: Left ventricular ejection fraction, by estimation, is 55  to 60%. The left ventricle has normal function. The left ventricle has no  regional wall motion abnormalities. The average left ventricular global  longitudinal strain is -19.1 %.  The global longitudinal strain is normal. The left ventricular internal  cavity size was normal in size. There is mild concentric left ventricular  hypertrophy of the posterior segment. Left ventricular diastolic  parameters are indeterminate.    Assessment and Plan: 1. H/o of afib, persistent atrial flutter for the last week, rate controlled,symptomatic  General education re afib, triggers, means of managing  Cannot increase rate control for slow v response  Continue metoprolol tartrate 12.5 mg bid  Continue  flecainide 100 mg bid    2. CHA2DS2VASc score of 2 Did not start eliquis when he was prescribed by Dr. Jens Som  Explained the importance of getting Eliquis 5 mg bid started to reduce stroke risk and to be able to restore SR He wants CV asap so will plan on TEE guided CV, he has had this done before  Bleeding  precautions discussed  Cmet/cbc/tsh today   He will f/u in afib clinic  one week after CV  Belva Koziel [facility] [address] [place], [place] [address] [phone]

## 2020-06-28 NOTE — Patient Instructions (Signed)
Start Eliquis 5mg  twice a day   Cardioversion scheduled for Tuesday, June 7th  - Arrive at the [birth year][facility]1969 and go to admitting at [facility] not eat or drink anything after midnight the night prior to your procedure.  - Take all your morning medication (except diabetic medications) with a sip of water prior to arrival.  - You will not be able to drive home after your procedure.  - Do NOT miss any doses of your blood thinner - if you should miss a dose please notify our office immediately.  - If you feel as if you go back into normal rhythm prior to scheduled cardioversion, please notify our office immediately. If your procedure is canceled in the cardioversion suite you will be charged a cancellation fee.  Patients will be asked to: to mask in public and hand hygiene (no longer quarantine) in the 3 days prior to surgery, to report if any COVID-19-like illness or household contacts to COVID-19 to determine need for testing

## 2020-07-03 ENCOUNTER — Ambulatory Visit (HOSPITAL_COMMUNITY)
Admission: RE | Admit: 2020-07-03 | Discharge: 2020-07-03 | Disposition: A | Discharge status: Home / Self Care | Payer: Medicare Other | Source: Ambulatory Visit | Attending: Physician Assistant | Admitting: Physician Assistant

## 2020-07-03 ENCOUNTER — Other Ambulatory Visit: Payer: Self-pay

## 2020-07-03 VITALS — HR 49

## 2020-07-03 DIAGNOSIS — I4819 Other persistent atrial fibrillation: Secondary | ICD-10-CM

## 2020-07-03 DIAGNOSIS — R001 Bradycardia, unspecified: Secondary | ICD-10-CM | POA: Diagnosis not present

## 2020-07-03 DIAGNOSIS — I440 Atrioventricular block, first degree: Secondary | ICD-10-CM | POA: Insufficient documentation

## 2020-07-03 DIAGNOSIS — D6869 Other thrombophilia: Secondary | ICD-10-CM | POA: Diagnosis not present

## 2020-07-03 DIAGNOSIS — Z7901 Long term (current) use of anticoagulants: Secondary | ICD-10-CM | POA: Diagnosis not present

## 2020-07-03 NOTE — Progress Notes (Signed)
Patient called clinic stating he felt he was back in SR. ECG today confirms sinus bradycardia HR 49, 1st degree AV block, PAC, PR 224, QRS 116, QTc 428. Will cancel DCCV. F/u with Dr Jens Som per recall. Continue anticoagulation.

## 2020-07-05 ENCOUNTER — Other Ambulatory Visit: Payer: Self-pay | Admitting: Pharmacist

## 2020-07-05 MED ORDER — RIVAROXABAN 20 MG PO TABS: 20.0000 mg | ORAL_TABLET | Freq: Every day | ORAL | 0 refills | Status: DC

## 2020-07-08 MED ORDER — ELIQUIS 5 MG PO TABS: 5.0000 mg | ORAL_TABLET | Freq: Two times a day (BID) | ORAL | 5 refills | Status: DC

## 2020-07-09 ENCOUNTER — Ambulatory Visit (HOSPITAL_COMMUNITY): Admission: RE | Admit: 2020-07-09 | Payer: Medicare Other | Source: Home / Self Care | Admitting: Cardiology

## 2020-07-09 ENCOUNTER — Encounter (HOSPITAL_COMMUNITY): Admission: RE | Payer: Self-pay | Source: Home / Self Care

## 2020-07-09 SURGERY — ECHOCARDIOGRAM, TRANSESOPHAGEAL
Anesthesia: General

## 2020-07-16 NOTE — Telephone Encounter (Signed)
His side effects match up with metoprolol, not Eliquis.  He is being seen by AF clinic, we might want to pass this on to them.  Doesn't look like he's been on diltiazem, that might work if they need rate control.

## 2020-07-18 ENCOUNTER — Ambulatory Visit (HOSPITAL_COMMUNITY): Payer: Medicare Other | Admitting: Nurse Practitioner

## 2020-07-25 ENCOUNTER — Other Ambulatory Visit: Payer: Self-pay

## 2020-07-25 ENCOUNTER — Encounter (HOSPITAL_COMMUNITY): Payer: Self-pay | Admitting: Nurse Practitioner

## 2020-07-25 ENCOUNTER — Ambulatory Visit (HOSPITAL_COMMUNITY)
Admission: RE | Admit: 2020-07-25 | Discharge: 2020-07-25 | Disposition: A | Discharge status: Home / Self Care | Payer: Medicare Other | Source: Ambulatory Visit | Attending: Nurse Practitioner | Admitting: Nurse Practitioner

## 2020-07-25 VITALS — BP 128/70 | HR 63 | Ht 74.0 in | Wt 162.0 lb

## 2020-07-25 DIAGNOSIS — D6869 Other thrombophilia: Secondary | ICD-10-CM

## 2020-07-25 DIAGNOSIS — I480 Paroxysmal atrial fibrillation: Secondary | ICD-10-CM | POA: Diagnosis not present

## 2020-07-25 DIAGNOSIS — Z79899 Other long term (current) drug therapy: Secondary | ICD-10-CM | POA: Insufficient documentation

## 2020-07-25 DIAGNOSIS — I484 Atypical atrial flutter: Secondary | ICD-10-CM

## 2020-07-25 DIAGNOSIS — I4892 Unspecified atrial flutter: Secondary | ICD-10-CM | POA: Diagnosis not present

## 2020-07-25 DIAGNOSIS — I4819 Other persistent atrial fibrillation: Secondary | ICD-10-CM | POA: Diagnosis present

## 2020-07-25 DIAGNOSIS — Z8249 Family history of ischemic heart disease and other diseases of the circulatory system: Secondary | ICD-10-CM | POA: Insufficient documentation

## 2020-07-25 DIAGNOSIS — Z7901 Long term (current) use of anticoagulants: Secondary | ICD-10-CM | POA: Diagnosis not present

## 2020-07-25 DIAGNOSIS — Z87891 Personal history of nicotine dependence: Secondary | ICD-10-CM | POA: Insufficient documentation

## 2020-07-25 NOTE — Progress Notes (Signed)
Primary Care Physician: Alferd Apa, MD Referring Physician:Dr. [name] is a 65 y.o. male with a h/o paroxysmal afib that has been managed well in the past with flecainide and metoprolol. His metoprolol dose was lowered earlier in the year for bradycardia in SR. He has had 4 episodes since the dose was lowered and this last episode has been persistent since last week. EKG shows atrial flutter with variable rate, with v rates in the 50's. Highest v rates have been in 80-90 bpm with exertion. He had a NICM when first dx with afib in 2008. LHC at that time showed normal coronary arteries and EF of 50%. Lat echo 03/2020, showed normal EF with severely dilated left atrium with a diameter of 4.70 and volume of 185 ml.   He denies any alcohol, tobacco, substance abuse,  excessive caffeine. He denies snoring unless he is on his back. No prior sleep study.   He has a CHA2DS2VASc score of 2(HTN, age). Dr. Jens Som started him on eliquis 5 mg bid several months ago but the pt states he did not start it as he was afraid of bleeding side effects. He states he has had 5 cardioversion's in the past, as well as some were TEE guided. He would like to have a CV asap. He voices exertional dyspnea, fatigue with being out of rhythm.    F/u in afib clinic, 07/26/19, he has returned to atrial flutter with variable block, v rates in the 60's, 5 days ago. I saw him late May, he was out of rhythm and cardioversion was planned but he self converted. There were several my chart messages that he stated that he was having fatigue on eliquis and wanted to stop it. He was told by PharmD that it was likely 2/2 the BB not eliquis. He has a h/o hep C and pharmacist at [facility] reassured him that eliquis was safe in that situation. He does not like being on meds. Very fatigued. I discussed ablation or change in antiarrythmic and he would prefer to discuss ablation. I feel even if he is cardioverted, he may go back  to afib/flutter soon, as he appears to be failing flecainide.   Today, he denies symptoms of palpitations, chest pain, shortness of breath, orthopnea, PND, lower extremity edema, dizziness, presyncope, syncope, or neurologic sequela. The patient is tolerating medications without difficulties and is otherwise without complaint today.   Past Medical History:  Diagnosis Date   ATRIAL FIBRILLATION 06/16/2008   Qualifier: Diagnosis of  By: [name], RMA, [name]     Atrial fibrillation (HCC)    Blood in stool    Cardiomyopathy    improved    DJD (degenerative joint disease) of lumbar spine    Gout    Hepatitis C    Hx of echocardiogram    a. Echo 12/13:  EF 60-65%, mod LAE, mild RAE, PASP 34   Intrinsic asthma, unspecified    Substance abuse (HCC)    Unspecified polyarthropathy or polyarthritis, multiple sites    Past Surgical History:  Procedure Laterality Date   HERNIA REPAIR  1966    Current Outpatient Medications  Medication Sig Dispense Refill   albuterol (VENTOLIN HFA) 108 (90 Base) MCG/ACT inhaler Inhale 2 puffs into the lungs every 6 (six) hours as needed.     allopurinol (ZYLOPRIM) 100 MG tablet Take 100 mg by mouth 2 (two) times daily.      apixaban (ELIQUIS) 5 MG TABS tablet Take  1 tablet (5 mg total) by mouth 2 (two) times daily. 60 tablet 5   flecainide (TAMBOCOR) 100 MG tablet Take 1 tablet (100 mg total) by mouth 2 (two) times daily. 180 tablet 3   metoprolol tartrate (LOPRESSOR) 25 MG tablet Take 0.5 tablets (12.5 mg total) by mouth 2 (two) times daily. Take extra 1/2 tablet as needed 90 tablet 3   No current facility-administered medications for this encounter.    Allergies  Allergen Reactions   Indomethacin Other (See Comments)    Headaches and tired    Social History   Socioeconomic History   Marital status: Married    Spouse name: Not on file   Number of children: Not on file   Years of education: Not on file   Highest education level: Not on file   Occupational History   Not on file  Tobacco Use   Smoking status: Former    Pack years: 0.00    Types: Cigarettes    Quit date: 02/02/2014    Years since quitting: 6.4   Smokeless tobacco: Never   Tobacco comments:    started in 1999. smoked 1/4 ppd   Substance and Sexual Activity   Alcohol use: No    Alcohol/week: 0.0 standard drinks    Comment: quit in 2006    Drug use: No    Comment: quit in 2006    Sexual activity: Not on file  Other Topics Concern   Not on file  Social History Narrative   Married; step children; disabled.    Social Determinants of Health   Financial Resource Strain: Not on file  Food Insecurity: Not on file  Transportation Needs: Not on file  Physical Activity: Not on file  Stress: Not on file  Social Connections: Not on file  Intimate Partner Violence: Not on file    Family History  Problem Relation Age of Onset   Heart failure Other        CHF - father and mother. (mother also had severe RA)    ROS- All systems are reviewed and negative except as per the HPI above  Physical Exam: Vitals:   07/25/20 1517  BP: 128/70  Pulse: 63  Weight: 73.5 kg  Height: 6\' 2"  (1.88 m)   Wt Readings from Last 3 Encounters:  07/25/20 73.5 kg  06/28/20 77.1 kg  02/28/20 77.2 kg    Labs: Lab Results  Component Value Date   NA 140 06/28/2020   K 5.2 (H) 06/28/2020   CL 107 06/28/2020   CO2 28 06/28/2020   GLUCOSE 103 (H) 06/28/2020   BUN 18 06/28/2020   CREATININE 1.14 06/28/2020   CALCIUM 9.4 06/28/2020   MG 2.0 08/18/2007   Lab Results  Component Value Date   INR 2.3 10/01/2008   Lab Results  Component Value Date   CHOL  09/15/2008    170        ATP III CLASSIFICATION:  <200     mg/dL   Desirable  09/17/2008  mg/dL   Borderline High  676-195    mg/dL   High          HDL 38 (L) 09/15/2008   LDLCALC (H) 09/15/2008    117        Total Cholesterol/HDL:CHD Risk Coronary Heart Disease Risk Table                     Men   Women  1/2  Average Risk   3.4  3.3  Average Risk       5.0   4.4  2 X Average Risk   9.6   7.1  3 X Average Risk  23.4   11.0        Use the calculated Patient Ratio above and the CHD Risk Table to determine the patient's CHD Risk.        ATP III CLASSIFICATION (LDL):  <100     mg/dL   Optimal  622-297  mg/dL   Near or Above                    Optimal  130-159  mg/dL   Borderline  989-211  mg/dL   High  >941     mg/dL   Very High   TRIG 77 74/09/1446     GEN- The patient is well appearing, alert and oriented x 3 today.   Head- normocephalic, atraumatic Eyes-  Sclera clear, conjunctiva pink Ears- hearing intact Oropharynx- clear Neck- supple, no JVP Lymph- no cervical lymphadenopathy Lungs- Clear to ausculation bilaterally, normal work of breathing Heart- slow irregular rate and rhythm, no murmurs, rubs or gallops, PMI not laterally displaced GI- soft, NT, ND, + BS Extremities- no clubbing, cyanosis, or edema MS- no significant deformity or atrophy Skin- no rash or lesion Psych- euthymic mood, full affect Neuro- strength and sensation are intact  EKG- atrial flutter at 63 bpm, qrs int 122 ms, qtc 424 ms  Echo- FINDINGS -03/22/20 1. Left ventricular ejection fraction, by estimation, is 55 to 60%. The  left ventricle has normal function. The left ventricle has no regional  wall motion abnormalities. There is mild concentric left ventricular  hypertrophy of the posterior segment.  Left ventricular diastolic parameters are indeterminate. The average left  ventricular global longitudinal strain is -19.1 %. The global longitudinal  strain is normal.   2. Right ventricular systolic function is normal. The right ventricular  size is normal.   3. Left atrial size was severely dilated. LA diam 4.70 cm but with volume of 185 ml  4. Right atrial size was severely dilated.   5. The mitral valve is normal in structure. Mild mitral valve  regurgitation. No evidence of mitral stenosis.   6.  Tricuspid valve regurgitation is mild to moderate.   7. The aortic valve is normal in structure. Aortic valve regurgitation is  not visualized. No aortic stenosis is present.   8. The inferior vena cava is normal in size with greater than 50%    Assessment and Plan: 1. H/o of afib, persistent atrial flutter intermittently,over the last  6-8 weeks  I feel he is failing flecainide I discussed change in AAD therapy but he does not like the meds he is on and would prefer to get off meds  General education re afib, triggers, means of managing discussed I will refer to EP to see if he is an ablation candidate but he does have dilated left atrium  Continue metoprolol tartrate 12.5 mg bid  Continue  flecainide 100 mg bid    2. CHA2DS2VASc score of 2 Continue eliquis 5 mg bid  States he did not stop meds as he was desiring to do so in his my chart messages   Referral to EP   Scott Wagner [facility] [address] [place], [place] [address] [phone]

## 2020-09-02 ENCOUNTER — Ambulatory Visit (INDEPENDENT_AMBULATORY_CARE_PROVIDER_SITE_OTHER): Payer: Medicare Other | Admitting: Internal Medicine

## 2020-09-02 ENCOUNTER — Other Ambulatory Visit: Payer: Self-pay

## 2020-09-02 ENCOUNTER — Encounter: Payer: Self-pay | Admitting: *Deleted

## 2020-09-02 VITALS — BP 112/80 | HR 65 | Ht 73.0 in | Wt 164.0 lb

## 2020-09-02 DIAGNOSIS — I10 Essential (primary) hypertension: Secondary | ICD-10-CM | POA: Diagnosis not present

## 2020-09-02 DIAGNOSIS — D6869 Other thrombophilia: Secondary | ICD-10-CM

## 2020-09-02 DIAGNOSIS — I480 Paroxysmal atrial fibrillation: Secondary | ICD-10-CM

## 2020-09-02 NOTE — Patient Instructions (Addendum)
Medication Instructions:  Your physician recommends that you continue on your current medications as directed. Please refer to the Current Medication list given to you today.  Labwork: None ordered.  Testing/Procedures: Your physician has requested that you have cardiac CT. Cardiac computed tomography (CT) is a painless test that uses an x-ray machine to take clear, detailed pictures of your heart. For further information please visit www.cardiosmart.org. Please follow instruction sheet as given. Your physician has recommended that you have an ablation. Catheter ablation is a medical procedure used to treat some cardiac arrhythmias (irregular heartbeats). During catheter ablation, a long, thin, flexible tube is put into a blood vessel in your groin (upper thigh), or neck. This tube is called an ablation catheter. It is then guided to your heart through the blood vessel. Radio frequency waves destroy small areas of heart tissue where abnormal heartbeats may cause an arrhythmia to start. Please see the instruction sheet given to you today.   Any Other Special Instructions Will Be Listed Below (If Applicable).  If you need a refill on your cardiac medications before your next appointment, please call your pharmacy.   Cardiac Ablation Cardiac ablation is a procedure to destroy (ablate) some heart tissue that is sending bad signals. These bad signals causeproblems in heart rhythm. The heart has many areas that make these signals. If there are problems in these areas, they can make the heart beat in a way that is not normal.Destroying some tissues can help make the heart rhythm normal. Tell your doctor about: Any allergies you have. All medicines you are taking. These include vitamins, herbs, eye drops, creams, and over-the-counter medicines. Any problems you or family members have had with medicines that make you fall asleep (anesthetics). Any blood disorders you have. Any surgeries you have  had. Any medical conditions you have, such as kidney failure. Whether you are pregnant or may be pregnant. What are the risks? This is a safe procedure. But problems may occur, including: Infection. Bruising and bleeding. Bleeding into the chest. Stroke or blood clots. Damage to nearby areas of your body. Allergies to medicines or dyes. The need for a pacemaker if the normal system is damaged. Failure of the procedure to treat the problem. What happens before the procedure? Medicines Ask your doctor about: Changing or stopping your normal medicines. This is important. Taking aspirin and ibuprofen. Do not take these medicines unless your doctor tells you to take them. Taking other medicines, vitamins, herbs, and supplements. General instructions Follow instructions from your doctor about what you cannot eat or drink. Plan to have someone take you home from the hospital or clinic. If you will be going home right after the procedure, plan to have someone with you for 24 hours. Ask your doctor what steps will be taken to prevent infection. What happens during the procedure?  An IV tube will be put into one of your veins. You will be given a medicine to help you relax. The skin on your neck or groin will be numbed. A cut (incision) will be made in your neck or groin. A needle will be put through your cut and into a large vein. A tube (catheter) will be put into the needle. The tube will be moved to your heart. Dye may be put through the tube. This helps your doctor see your heart. Small devices (electrodes) on the tube will send out signals. A type of energy will be used to destroy some heart tissue. The tube will be taken   out. Pressure will be held on your cut. This helps stop bleeding. A bandage will be put over your cut. The exact procedure may vary among doctors and hospitals. What happens after the procedure? You will be watched until you leave the hospital or clinic. This  includes checking your heart rate, breathing rate, oxygen, and blood pressure. Your cut will be watched for bleeding. You will need to lie still for a few hours. Do not drive for 24 hours or as long as your doctor tells you. Summary Cardiac ablation is a procedure to destroy some heart tissue. This is done to treat heart rhythm problems. Tell your doctor about any medical conditions you may have. Tell him or her about all medicines you are taking to treat them. This is a safe procedure. But problems may occur. These include infection, bruising, bleeding, and damage to nearby areas of your body. Follow what your doctor tells you about food and drink. You may also be told to change or stop some of your medicines. After the procedure, do not drive for 24 hours or as long as your doctor tells you. This information is not intended to replace advice given to you by your health care provider. Make sure you discuss any questions you have with your healthcare provider. Document Revised: 12/22/2018 Document Reviewed: 12/22/2018 Elsevier Patient Education  2022 Elsevier Inc.      

## 2020-09-02 NOTE — Progress Notes (Signed)
Electrophysiology Office Note   Date:  09/02/2020   ID:  Scott Wagner, DOB 04/25/1955, MRN [id number]  PCP:  Alferd Apa, MD  Cardiologist:  Dr Jens Som Primary Electrophysiologist: [name], MD    CC: afib   History of Present Illness: Scott Wagner is a 65 y.o. male who presents today for electrophysiology evaluation.   He is referred by Rudi Coco and Dr Jens Som for EP consultation regarding afib. He has had afib since at least 2008.  He had tachycardia mediated CM which resolved. He did very well with flecainide initially.   He has severe LA enlargement. He reports increasing frequency and duration of atrial fibrillation.  He has had 5 prior cardioversions. Though notes suggest atrial flutter 07/25/20, this is actually coarse afib. During afib, he has symptoms of papitations and fatigue.  + decreased exercise tolerance.  Today, he denies symptoms of palpitations, chest pain, shortness of breath, orthopnea, PND, lower extremity edema, claudication, dizziness, presyncope, syncope, bleeding, or neurologic sequela. The patient is tolerating medications without difficulties and is otherwise without complaint today.    Past Medical History:  Diagnosis Date   ATRIAL FIBRILLATION 06/16/2008   Qualifier: Diagnosis of  By: [name], RMA, [name]     Atrial fibrillation (HCC)    Blood in stool    Cardiomyopathy    improved    DJD (degenerative joint disease) of lumbar spine    Gout    Hepatitis C    Hx of echocardiogram    a. Echo 12/13:  EF 60-65%, mod LAE, mild RAE, PASP 34   Intrinsic asthma, unspecified    Substance abuse (HCC)    Unspecified polyarthropathy or polyarthritis, multiple sites    Past Surgical History:  Procedure Laterality Date   HERNIA REPAIR  1966     Current Outpatient Medications  Medication Sig Dispense Refill   albuterol (VENTOLIN HFA) 108 (90 Base) MCG/ACT inhaler Inhale 2 puffs into the lungs every 6 (six) hours as needed.      apixaban (ELIQUIS) 5 MG TABS tablet Take 1 tablet (5 mg total) by mouth 2 (two) times daily. 60 tablet 5   flecainide (TAMBOCOR) 100 MG tablet Take 1 tablet (100 mg total) by mouth 2 (two) times daily. 180 tablet 3   metoprolol tartrate (LOPRESSOR) 25 MG tablet Take 0.5 tablets (12.5 mg total) by mouth 2 (two) times daily. Take extra 1/2 tablet as needed 90 tablet 3   allopurinol (ZYLOPRIM) 100 MG tablet Take 100 mg by mouth 2 (two) times daily.      No current facility-administered medications for this visit.    Allergies:   Indomethacin   Social History:  The patient  reports that he quit smoking about 6 years ago. He has never used smokeless tobacco. He reports that he does not drink alcohol and does not use drugs.   Family History:  The patient's  family history includes Heart failure in an other family member.    ROS:  Please see the history of present illness.   All other systems are personally reviewed and negative.    PHYSICAL EXAM: VS:  BP 112/80   Pulse 65   Ht 6\' 1"  (1.854 m)   Wt 164 lb (74.4 kg)   SpO2 97%   BMI 21.64 kg/m  , BMI Body mass index is 21.64 kg/m. GEN: Well nourished, well developed, in no acute distress HEENT: normal Neck: no JVD, carotid bruits, or masses Cardiac: iRRR; no murmurs, rubs, or gallops,no  edema  Respiratory:  clear to auscultation bilaterally, normal work of breathing GI: soft, nontender, nondistended, + BS MS: no deformity or atrophy Skin: warm and dry , very darkly tanned Neuro:  Strength and sensation are intact Psych: euthymic mood, full affect  EKG:  EKG is ordered today. The ekg ordered today is personally reviewed and shows afib, 65 bpm   Recent Labs: 06/28/2020: ALT 54; BUN 18; Creatinine, Ser 1.14; Hemoglobin 14.5; Platelets 188; Potassium 5.2; Sodium 140; TSH 1.924  personally reviewed   Lipid Panel     Component Value Date/Time   CHOL  09/15/2008 0500    170        ATP III CLASSIFICATION:  <200     mg/dL    Desirable  388-828  mg/dL   Borderline High  >=003    mg/dL   High          TRIG 77 09/15/2008 0500   HDL 38 (L) 09/15/2008 0500   CHOLHDL 4.5 09/15/2008 0500   VLDL 15 09/15/2008 0500   LDLCALC (H) 09/15/2008 0500    117        Total Cholesterol/HDL:CHD Risk Coronary Heart Disease Risk Table                     Men   Women  1/2 Average Risk   3.4   3.3  Average Risk       5.0   4.4  2 X Average Risk   9.6   7.1  3 X Average Risk  23.4   11.0        Use the calculated Patient Ratio above and the CHD Risk Table to determine the patient's CHD Risk.        ATP III CLASSIFICATION (LDL):  <100     mg/dL   Optimal  491-791  mg/dL   Near or Above                    Optimal  130-159  mg/dL   Borderline  505-697  mg/dL   High  >948     mg/dL   Very High   personally reviewed   Wt Readings from Last 3 Encounters:  09/02/20 164 lb (74.4 kg)  07/25/20 162 lb (73.5 kg)  06/28/20 170 lb (77.1 kg)      Other studies personally reviewed: Additional studies/ records that were reviewed today include: AF clinic notes, prior ekgs, prior echo  Review of the above records today demonstrates: as above   ASSESSMENT AND PLAN:  1.  Persistent atrial fibrillation The patient has symptomatic, recurrent  atrial fibrillation. he has failed medical therapy with flecainide. Chads2vasc score is 2.  he is anticoagulated with eliquis.  He has severe biatrial enlargement. Therapeutic strategies for afib including medicine and ablation were discussed in detail with the patient today. Risk, benefits, and alternatives to EP study and radiofrequency ablation for afib were also discussed in detail today. These risks include but are not limited to stroke, bleeding, vascular damage, tamponade, perforation, damage to the esophagus, lungs, and other structures, pulmonary vein stenosis, worsening renal function, and death. The patient understands these risk and wishes to proceed.  We will therefore proceed with  catheter ablation at the next available time.  Carto, ICE, anesthesia are requested for the procedure.  Will also obtain cardiac CT prior to the procedure to exclude LAA thrombus and further evaluate atrial anatomy.   Risks, benefits and potential toxicities for medications  prescribed and/or refilled reviewed with patient today.   2. HTN Continue metoprolol 12.5mg  BID  3. Atypical chest pain Well controlled No changes    Signed, [name], MD  09/02/2020 12:37 PM     [facility] [address] [place] [address] [phone] (office) [phone] (fax)

## 2020-10-04 ENCOUNTER — Other Ambulatory Visit: Payer: Self-pay

## 2020-10-04 ENCOUNTER — Other Ambulatory Visit: Payer: Medicare Other | Admitting: *Deleted

## 2020-10-04 DIAGNOSIS — I10 Essential (primary) hypertension: Secondary | ICD-10-CM

## 2020-10-04 DIAGNOSIS — D6869 Other thrombophilia: Secondary | ICD-10-CM

## 2020-10-04 DIAGNOSIS — I480 Paroxysmal atrial fibrillation: Secondary | ICD-10-CM

## 2020-10-05 LAB — BASIC METABOLIC PANEL
BUN/Creatinine Ratio: 24 (ref 10–24)
BUN: 28 mg/dL — ABNORMAL HIGH (ref 8–27)
CO2: 23 mmol/L (ref 20–29)
Calcium: 9.3 mg/dL (ref 8.6–10.2)
Chloride: 108 mmol/L — ABNORMAL HIGH (ref 96–106)
Creatinine, Ser: 1.19 mg/dL (ref 0.76–1.27)
Glucose: 92 mg/dL (ref 65–99)
Potassium: 4.8 mmol/L (ref 3.5–5.2)
Sodium: 144 mmol/L (ref 134–144)
eGFR: 68 mL/min/{1.73_m2} (ref >59)

## 2020-10-05 LAB — CBC WITH DIFFERENTIAL/PLATELET
Basophils Absolute: 0.1 10*3/uL (ref 0.0–0.2)
Basos: 2 %
EOS (ABSOLUTE): 0.2 10*3/uL (ref 0.0–0.4)
Eos: 4 %
Hematocrit: 41.6 % (ref 37.5–51.0)
Hemoglobin: 14.3 g/dL (ref 13.0–17.7)
Immature Grans (Abs): 0 10*3/uL (ref 0.0–0.1)
Immature Granulocytes: 0 %
Lymphocytes Absolute: 1.6 10*3/uL (ref 0.7–3.1)
Lymphs: 30 %
MCH: 31.5 pg (ref 26.6–33.0)
MCHC: 34.4 g/dL (ref 31.5–35.7)
MCV: 92 fL (ref 79–97)
Monocytes Absolute: 0.5 10*3/uL (ref 0.1–0.9)
Monocytes: 10 %
Neutrophils Absolute: 3 10*3/uL (ref 1.4–7.0)
Neutrophils: 54 %
Platelets: 183 10*3/uL (ref 150–450)
RBC: 4.54 x10E6/uL (ref 4.14–5.80)
RDW: 13.6 % (ref 11.6–15.4)
WBC: 5.4 10*3/uL (ref 3.4–10.8)

## 2020-10-08 ENCOUNTER — Telehealth (HOSPITAL_COMMUNITY): Payer: Self-pay | Admitting: Emergency Medicine

## 2020-10-08 NOTE — Telephone Encounter (Signed)
Unable to leave vm.

## 2020-10-09 ENCOUNTER — Other Ambulatory Visit: Payer: Self-pay

## 2020-10-09 ENCOUNTER — Ambulatory Visit (HOSPITAL_COMMUNITY)
Admission: RE | Admit: 2020-10-09 | Discharge: 2020-10-09 | Disposition: A | Discharge status: Home / Self Care | Payer: Medicare Other | Source: Ambulatory Visit | Attending: Internal Medicine | Admitting: Internal Medicine

## 2020-10-09 DIAGNOSIS — I480 Paroxysmal atrial fibrillation: Secondary | ICD-10-CM

## 2020-10-09 MED ORDER — IOHEXOL 350 MG/ML SOLN
80.0000 mL | Freq: Once | INTRAVENOUS | Status: AC | PRN
  Administered 2020-10-09: 80 mL via INTRAVENOUS

## 2020-10-15 NOTE — Anesthesia Preprocedure Evaluation (Addendum)
Anesthesia Evaluation  Patient identified by MRN, date of birth, ID band Patient awake    Reviewed: Allergy & Precautions, NPO status , Patient's Chart, lab work & pertinent test results, reviewed documented beta blocker date and time   Airway Mallampati: II  TM Distance: >3 FB Neck ROM: Full    Dental  (+) Dental Advisory Given, Missing, Caps,    Pulmonary asthma , former smoker,    Pulmonary exam normal breath sounds clear to auscultation       Cardiovascular hypertension, Pt. on home beta blockers (-) angina+ dysrhythmias Atrial Fibrillation  Rhythm:Irregular Rate:Abnormal  Echo 03/22/20: 1. Left ventricular ejection fraction, by estimation, is 55 to 60%. The  left ventricle has normal function. The left ventricle has no regional  wall motion abnormalities. There is mild concentric left ventricular  hypertrophy of the posterior segment.  Left ventricular diastolic parameters are indeterminate. The average left  ventricular global longitudinal strain is -19.1 %. The global longitudinal  strain is normal.  2. Right ventricular systolic function is normal. The right ventricular  size is normal.  3. Left atrial size was severely dilated.  4. Right atrial size was severely dilated.  5. The mitral valve is normal in structure. Mild mitral valve  regurgitation. No evidence of mitral stenosis.  6. Tricuspid valve regurgitation is mild to moderate.  7. The aortic valve is normal in structure. Aortic valve regurgitation is  not visualized. No aortic stenosis is present.  8. The inferior vena cava is normal in size with greater than 50%  respiratory variability, suggesting right atrial pressure of 3 mmHg.    Neuro/Psych negative neurological ROS  negative psych ROS   GI/Hepatic negative GI ROS, (+) Hepatitis -, C  Endo/Other  negative endocrine ROS  Renal/GU Renal InsufficiencyRenal disease     Musculoskeletal  (+)  Arthritis ,   Abdominal   Peds  Hematology  (+) Blood dyscrasia (Eliquis), ,   Anesthesia Other Findings   Reproductive/Obstetrics                           Anesthesia Physical Anesthesia Plan  ASA: 3  Anesthesia Plan: General   Post-op Pain Management:    Induction: Intravenous  PONV Risk Score and Plan: 2 and Midazolam, Dexamethasone and Ondansetron  Airway Management Planned: Oral ETT  Additional Equipment:   Intra-op Plan:   Post-operative Plan: Extubation in OR  Informed Consent: I have reviewed the patients History and Physical, chart, labs and discussed the procedure including the risks, benefits and alternatives for the proposed anesthesia with the patient or authorized representative who has indicated his/her understanding and acceptance.     Dental advisory given  Plan Discussed with: CRNA  Anesthesia Plan Comments:        Anesthesia Quick Evaluation

## 2020-10-15 NOTE — Pre-Procedure Instructions (Signed)
Attempted to call patient regarding procedure instructions.  No answer 

## 2020-10-16 ENCOUNTER — Ambulatory Visit (HOSPITAL_COMMUNITY)
Admission: RE | Admit: 2020-10-16 | Discharge: 2020-10-16 | Disposition: A | Discharge status: Home / Self Care | Payer: Medicare Other | Attending: Internal Medicine | Admitting: Internal Medicine

## 2020-10-16 ENCOUNTER — Ambulatory Visit (HOSPITAL_COMMUNITY): Payer: Medicare Other | Admitting: Anesthesiology

## 2020-10-16 ENCOUNTER — Ambulatory Visit (HOSPITAL_COMMUNITY)
Admission: RE | Disposition: A | Discharge status: Home / Self Care | Payer: Self-pay | Source: Home / Self Care | Attending: Internal Medicine

## 2020-10-16 ENCOUNTER — Other Ambulatory Visit: Payer: Self-pay

## 2020-10-16 DIAGNOSIS — Z888 Allergy status to other drugs, medicaments and biological substances status: Secondary | ICD-10-CM | POA: Insufficient documentation

## 2020-10-16 DIAGNOSIS — Z7901 Long term (current) use of anticoagulants: Secondary | ICD-10-CM | POA: Diagnosis not present

## 2020-10-16 DIAGNOSIS — I4819 Other persistent atrial fibrillation: Secondary | ICD-10-CM | POA: Diagnosis not present

## 2020-10-16 DIAGNOSIS — Z79899 Other long term (current) drug therapy: Secondary | ICD-10-CM | POA: Diagnosis not present

## 2020-10-16 DIAGNOSIS — Z87891 Personal history of nicotine dependence: Secondary | ICD-10-CM | POA: Insufficient documentation

## 2020-10-16 DIAGNOSIS — Z8249 Family history of ischemic heart disease and other diseases of the circulatory system: Secondary | ICD-10-CM | POA: Diagnosis not present

## 2020-10-16 HISTORY — PX: ATRIAL FIBRILLATION ABLATION: EP1191

## 2020-10-16 LAB — POCT ACTIVATED CLOTTING TIME
Activated Clotting Time: 294 seconds
Activated Clotting Time: 300 seconds
Activated Clotting Time: 300 seconds

## 2020-10-16 SURGERY — ATRIAL FIBRILLATION ABLATION
Anesthesia: General

## 2020-10-16 MED ORDER — SUGAMMADEX SODIUM 200 MG/2ML IV SOLN
INTRAVENOUS | Status: DC | PRN
  Administered 2020-10-16: 200 mg via INTRAVENOUS

## 2020-10-16 MED ORDER — MIDAZOLAM HCL 2 MG/2ML IJ SOLN
INTRAMUSCULAR | Status: DC | PRN
  Administered 2020-10-16: 2 mg via INTRAVENOUS

## 2020-10-16 MED ORDER — ONDANSETRON HCL 4 MG/2ML IJ SOLN
INTRAMUSCULAR | Status: DC | PRN
  Administered 2020-10-16: 4 mg via INTRAVENOUS

## 2020-10-16 MED ORDER — HEPARIN (PORCINE) IN NACL 1000-0.9 UT/500ML-% IV SOLN
INTRAVENOUS | Status: AC
  Filled 2020-10-16: qty 500

## 2020-10-16 MED ORDER — PROTAMINE SULFATE 10 MG/ML IV SOLN
INTRAVENOUS | Status: DC | PRN
  Administered 2020-10-16: 40 mg via INTRAVENOUS

## 2020-10-16 MED ORDER — ROCURONIUM BROMIDE 10 MG/ML (PF) SYRINGE
PREFILLED_SYRINGE | INTRAVENOUS | Status: DC | PRN
  Administered 2020-10-16: 60 mg via INTRAVENOUS
  Administered 2020-10-16: 40 mg via INTRAVENOUS

## 2020-10-16 MED ORDER — PANTOPRAZOLE SODIUM 40 MG PO TBEC: 40.0000 mg | DELAYED_RELEASE_TABLET | Freq: Every day | ORAL | 0 refills | Status: DC

## 2020-10-16 MED ORDER — SODIUM CHLORIDE 0.9 % IV SOLN: INTRAVENOUS | Status: DC

## 2020-10-16 MED ORDER — FENTANYL CITRATE (PF) 250 MCG/5ML IJ SOLN
INTRAMUSCULAR | Status: DC | PRN
  Administered 2020-10-16 (×2): 50 ug via INTRAVENOUS

## 2020-10-16 MED ORDER — HEPARIN SODIUM (PORCINE) 1000 UNIT/ML IJ SOLN
INTRAMUSCULAR | Status: AC
  Filled 2020-10-16: qty 1

## 2020-10-16 MED ORDER — HYDROCODONE-ACETAMINOPHEN 5-325 MG PO TABS: 1.0000 | ORAL_TABLET | ORAL | Status: DC | PRN

## 2020-10-16 MED ORDER — PROPOFOL 10 MG/ML IV BOLUS
INTRAVENOUS | Status: DC | PRN
  Administered 2020-10-16: 160 mg via INTRAVENOUS

## 2020-10-16 MED ORDER — APIXABAN 5 MG PO TABS
5.0000 mg | ORAL_TABLET | Freq: Once | ORAL | Status: AC
  Administered 2020-10-16: 5 mg via ORAL
  Filled 2020-10-16: qty 1

## 2020-10-16 MED ORDER — SODIUM CHLORIDE 0.9 % IV SOLN: 250.0000 mL | INTRAVENOUS | Status: DC | PRN

## 2020-10-16 MED ORDER — ACETAMINOPHEN 500 MG PO TABS
1000.0000 mg | ORAL_TABLET | Freq: Once | ORAL | Status: AC
  Administered 2020-10-16: 1000 mg via ORAL
  Filled 2020-10-16 (×2): qty 2

## 2020-10-16 MED ORDER — HEPARIN SODIUM (PORCINE) 1000 UNIT/ML IJ SOLN
INTRAMUSCULAR | Status: DC | PRN
  Administered 2020-10-16 (×3): 3000 [IU] via INTRAVENOUS

## 2020-10-16 MED ORDER — PHENYLEPHRINE HCL-NACL 20-0.9 MG/250ML-% IV SOLN
INTRAVENOUS | Status: DC | PRN
  Administered 2020-10-16: 50 ug/min via INTRAVENOUS

## 2020-10-16 MED ORDER — LIDOCAINE 2% (20 MG/ML) 5 ML SYRINGE
INTRAMUSCULAR | Status: DC | PRN
Start: 2020-10-16 — End: 2020-10-16
  Administered 2020-10-16: 80 mg via INTRAVENOUS

## 2020-10-16 MED ORDER — PHENYLEPHRINE 40 MCG/ML (10ML) SYRINGE FOR IV PUSH (FOR BLOOD PRESSURE SUPPORT)
PREFILLED_SYRINGE | INTRAVENOUS | Status: DC | PRN
  Administered 2020-10-16: 160 ug via INTRAVENOUS

## 2020-10-16 MED ORDER — DEXAMETHASONE SODIUM PHOSPHATE 10 MG/ML IJ SOLN
INTRAMUSCULAR | Status: DC | PRN
  Administered 2020-10-16: 10 mg via INTRAVENOUS

## 2020-10-16 MED ORDER — EPHEDRINE SULFATE-NACL 50-0.9 MG/10ML-% IV SOSY
PREFILLED_SYRINGE | INTRAVENOUS | Status: DC | PRN
  Administered 2020-10-16 (×2): 10 mg via INTRAVENOUS

## 2020-10-16 MED ORDER — ONDANSETRON HCL 4 MG/2ML IJ SOLN: 4.0000 mg | Freq: Four times a day (QID) | INTRAMUSCULAR | Status: DC | PRN

## 2020-10-16 MED ORDER — HEPARIN (PORCINE) IN NACL 1000-0.9 UT/500ML-% IV SOLN
INTRAVENOUS | Status: DC | PRN
  Administered 2020-10-16 (×2): 500 mL

## 2020-10-16 MED ORDER — SODIUM CHLORIDE 0.9% FLUSH: 3.0000 mL | Freq: Two times a day (BID) | INTRAVENOUS | Status: DC

## 2020-10-16 MED ORDER — HEPARIN SODIUM (PORCINE) 1000 UNIT/ML IJ SOLN
INTRAMUSCULAR | Status: DC | PRN
  Administered 2020-10-16: 15000 [IU] via INTRAVENOUS

## 2020-10-16 MED ORDER — ACETAMINOPHEN 325 MG PO TABS: 650.0000 mg | ORAL_TABLET | ORAL | Status: DC | PRN

## 2020-10-16 MED ORDER — SODIUM CHLORIDE 0.9% FLUSH: 3.0000 mL | INTRAVENOUS | Status: DC | PRN

## 2020-10-16 SURGICAL SUPPLY — 19 items
BLANKET WARM UNDERBOD FULL ACC (MISCELLANEOUS) ×3 IMPLANT
CATH 8FR REPROCESSED SOUNDSTAR (CATHETERS) ×3 IMPLANT
CATH 8FR SOUNDSTAR REPROCESSED (CATHETERS) IMPLANT
CATH OCTARAY 2.0 F 3-3-3-3-3 (CATHETERS) ×2 IMPLANT
CATH SMTCH THERMOCOOL SF DF (CATHETERS) ×2 IMPLANT
CATH WEB BI DIR CSDF CRV REPRO (CATHETERS) ×2 IMPLANT
CLOSURE PERCLOSE PROSTYLE (VASCULAR PRODUCTS) ×6 IMPLANT
COVER SWIFTLINK CONNECTOR (BAG) ×3 IMPLANT
NDL BAYLIS TRANSSEPTAL 71CM (NEEDLE) IMPLANT
NEEDLE BAYLIS TRANSSEPTAL 71CM (NEEDLE) ×3 IMPLANT
PACK EP LATEX FREE (CUSTOM PROCEDURE TRAY) ×3
PACK EP LF (CUSTOM PROCEDURE TRAY) ×1 IMPLANT
PAD PRO RADIOLUCENT 2001M-C (PAD) ×3 IMPLANT
PATCH CARTO3 (PAD) ×2 IMPLANT
SHEATH BAYLIS TORFLEX (SHEATH) ×2 IMPLANT
SHEATH PINNACLE 7F 10CM (SHEATH) ×4 IMPLANT
SHEATH PINNACLE 9F 10CM (SHEATH) ×2 IMPLANT
SHEATH PROBE COVER 6X72 (BAG) ×2 IMPLANT
TUBING SMART ABLATE COOLFLOW (TUBING) ×2 IMPLANT

## 2020-10-16 NOTE — Progress Notes (Signed)
Pt ambulated without difficulty or bleeding.   Discharged home with his wife who will drive and stay with pt x 24 hrs. 

## 2020-10-16 NOTE — H&P (Signed)
CC: afib   History of Present Illness: Scott Wagner is a 65 y.o. male who presents today for electrophysiology study and ablation of afib.   He has had afib since at least 2008.  He had tachycardia mediated CM which resolved. He did very well with flecainide initially.   He has severe LA enlargement. He reports increasing frequency and duration of atrial fibrillation.  He has had 5 prior cardioversions. Though notes suggest atrial flutter 07/25/20, this is actually coarse afib. During afib, he has symptoms of papitations and fatigue.  + decreased exercise tolerance.   Today, he denies symptoms of palpitations, chest pain, shortness of breath, orthopnea, PND, lower extremity edema, claudication, dizziness, presyncope, syncope, bleeding, or neurologic sequela. The patient is tolerating medications without difficulties and is otherwise without complaint today.          Past Medical History:  Diagnosis Date   ATRIAL FIBRILLATION 06/16/2008    Qualifier: Diagnosis of  By: [name], RMA, [name]     Atrial fibrillation (HCC)     Blood in stool     Cardiomyopathy      improved    DJD (degenerative joint disease) of lumbar spine     Gout     Hepatitis C     Hx of echocardiogram      a. Echo 12/13:  EF 60-65%, mod LAE, mild RAE, PASP 34   Intrinsic asthma, unspecified     Substance abuse (HCC)     Unspecified polyarthropathy or polyarthritis, multiple sites           Past Surgical History:  Procedure Laterality Date   HERNIA REPAIR   1966              Current Outpatient Medications  Medication Sig Dispense Refill   albuterol (VENTOLIN HFA) 108 (90 Base) MCG/ACT inhaler Inhale 2 puffs into the lungs every 6 (six) hours as needed.       apixaban (ELIQUIS) 5 MG TABS tablet Take 1 tablet (5 mg total) by mouth 2 (two) times daily. 60 tablet 5   flecainide (TAMBOCOR) 100 MG tablet Take 1 tablet (100 mg total) by mouth 2 (two) times daily. 180 tablet 3   metoprolol tartrate (LOPRESSOR)  25 MG tablet Take 0.5 tablets (12.5 mg total) by mouth 2 (two) times daily. Take extra 1/2 tablet as needed 90 tablet 3   allopurinol (ZYLOPRIM) 100 MG tablet Take 100 mg by mouth 2 (two) times daily.         No current facility-administered medications for this visit.      Allergies:   Indomethacin    Social History:  The patient  reports that he quit smoking about 6 years ago. He has never used smokeless tobacco. He reports that he does not drink alcohol and does not use drugs.    Family History:  The patient's  family history includes Heart failure in an other family member.      ROS:  Please see the history of present illness.   All other systems are personally reviewed and negative.      PHYSICAL EXAM: Vitals:   10/16/20 0646 10/16/20 0706  BP:  (!) 175/117  Pulse: 75   Resp: 16   Temp: 97.7 F (36.5 C)   SpO2: 99%    GEN: Well nourished, well developed, in no acute distress HEENT: normal Neck: no JVD, carotid bruits, or masses Cardiac: iRRR; no murmurs, rubs, or gallops,no edema  Respiratory:  clear to auscultation bilaterally,  normal work of breathing GI: soft, nontender, nondistended, + BS MS: no deformity or atrophy Skin: warm and dry , very darkly tanned Neuro:  Strength and sensation are intact Psych: euthymic mood, full affect      Other studies personally reviewed: Additional studies/ records that were reviewed today include: AF clinic notes, prior ekgs, prior echo  Review of the above records today demonstrates: as above     ASSESSMENT AND PLAN:   1.  Persistent atrial fibrillation The patient has symptomatic, recurrent  atrial fibrillation. he has failed medical therapy with flecainide. Chads2vasc score is 2.  he is anticoagulated with eliquis.  He has severe biatrial enlargement.   Risk, benefits, and alternatives to EP study and radiofrequency ablation for afib were again discussed in detail today. These risks include but are not limited to stroke,  bleeding, vascular damage, tamponade, perforation, damage to the esophagus, lungs, and other structures, pulmonary vein stenosis, worsening renal function, and death. The patient understands these risk and wishes to proceed.    Cardiac CT reviewed at length with the patient today.  he reports compliance with OAC without interruption.  [name] MD, [facility] 10/16/2020 8:17 AM

## 2020-10-16 NOTE — Discharge Instructions (Addendum)
Post procedure care instructions No driving for 4 days. No lifting over 5 lbs for 1 week. No vigorous or sexual activity for 1 week. You may return to work/your usual activities on 10/24/20. Keep procedure site clean & dry. If you notice increased pain, swelling, bleeding or pus, call/return!  You may shower after 24 hours, but no soaking in baths/hot tubs/pools for 1 week.    You have an appointment set up with the Atrial Fibrillation Clinic.  Multiple studies have shown that being followed by a dedicated atrial fibrillation clinic in addition to the standard care you receive from your other physicians improves health. We believe that enrollment in the atrial fibrillation clinic will allow us to better care for you.   The phone number to the Atrial Fibrillation Clinic is [phone]. The clinic is staffed Monday through Friday from 8:30am to 5pm.  Parking Directions: The clinic is located in the Heart and Vascular Building connected to [facility]. 1)From [address] turn on to [address] and go to the 3rd entrance  (Heart and Vascular entrance) on the right. 2)Look to the right for Heart &Vascular Parking Garage. 3)A code for the entrance is required, for Oct is 3333.   4)Take the elevators to the 1st floor. Registration is in the room with the glass walls at the end of the hallway.  If you have any trouble parking or locating the clinic, please don't hesitate to call [phone].  Cardiac Ablation, Care After  This sheet gives you information about how to care for yourself after your procedure. Your health care provider may also give you more specific instructions. If you have problems or questions, contact your health care provider. What can I expect after the procedure? After the procedure, it is common to have: Bruising around your puncture site. Tenderness around your puncture site. Skipped heartbeats. Tiredness (fatigue).  Follow these instructions at home: Puncture  site care  Follow instructions from your health care provider about how to take care of your puncture site. Make sure you: If present, leave stitches (sutures), skin glue, or adhesive strips in place. These skin closures may need to stay in place for up to 2 weeks. If adhesive strip edges start to loosen and curl up, you may trim the loose edges. Do not remove adhesive strips completely unless your health care provider tells you to do that. If a large square bandage is present, this may be removed 24 hours after surgery.  Check your puncture site every day for signs of infection. Check for: Redness, swelling, or pain. Fluid or blood. If your puncture site starts to bleed, lie down on your back, apply firm pressure to the area, and contact your health care provider. Warmth. Pus or a bad smell. Driving Do not drive for at least 4 days after your procedure or however long your health care provider recommends. (Do not resume driving if you have previously been instructed not to drive for other health reasons.) Do not drive or use heavy machinery while taking prescription pain medicine. Activity Avoid activities that take a lot of effort for at least 7 days after your procedure. Do not lift anything that is heavier than 5 lb (4.5 kg) for one week.  No sexual activity for 1 week.  Return to your normal activities as told by your health care provider. Ask your health care provider what activities are safe for you. General instructions Take over-the-counter and prescription medicines only as told by your health care provider.   care provider. Do not use any products that contain nicotine or tobacco, such as cigarettes and e-cigarettes. If you need help quitting, ask your health care provider. You may shower after 24 hours, but Do not take baths, swim, or use a hot tub for 1 week.  Do not drink alcohol for 24 hours after your procedure. Keep all follow-up visits as told by your health care provider. This  is important. Contact a health care provider if: You have redness, mild swelling, or pain around your puncture site. You have fluid or blood coming from your puncture site that stops after applying firm pressure to the area. Your puncture site feels warm to the touch. You have pus or a bad smell coming from your puncture site. You have a fever. You have chest pain or discomfort that spreads to your neck, jaw, or arm. You are sweating a lot. You feel nauseous. You have a fast or irregular heartbeat. You have shortness of breath. You are dizzy or light-headed and feel the need to lie down. You have pain or numbness in the arm or leg closest to your puncture site. Get help right away if: Your puncture site suddenly swells. Your puncture site is bleeding and the bleeding does not stop after applying firm pressure to the area. These symptoms may represent a serious problem that is an emergency. Do not wait to see if the symptoms will go away. Get medical help right away. Call your local emergency services (911 in the [place] Do not drive yourself to the hospital. Summary After the procedure, it is normal to have bruising and tenderness at the puncture site in your groin, neck, or forearm. Check your puncture site every day for signs of infection. Get help right away if your puncture site is bleeding and the bleeding does not stop after applying firm pressure to the area. This is a medical emergency. This information is not intended to replace advice given to you by your health care provider. Make sure you discuss any questions you have with your health care provider.

## 2020-10-16 NOTE — Transfer of Care (Signed)
Immediate Anesthesia Transfer of Care Note  Patient: Scott Wagner  Procedure(s) Performed: ATRIAL FIBRILLATION ABLATION  Patient Location: Cath Lab  Anesthesia Type:General  Level of Consciousness: drowsy and patient cooperative  Airway & Oxygen Therapy: Patient Spontanous Breathing and Patient connected to nasal cannula oxygen  Post-op Assessment: Report given to RN and Post -op Vital signs reviewed and stable  Post vital signs: Reviewed and stable  Last Vitals:  Vitals Value Taken Time  BP 137/67 10/16/20 1122  Temp    Pulse 71 10/16/20 1123  Resp 19 10/16/20 1123  SpO2 98 % 10/16/20 1123  Vitals shown include unvalidated device data.  Last Pain:  Vitals:   10/16/20 0706  TempSrc:   PainSc: 0-No pain      Patients Stated Pain Goal: 3 (10/16/20 0706)  Complications: No notable events documented.

## 2020-10-16 NOTE — Anesthesia Procedure Notes (Signed)
Procedure Name: Intubation Date/Time: 10/16/2020 8:40 AM Performed by: Rosiland Oz, CRNA Pre-anesthesia Checklist: Patient identified, Emergency Drugs available, Suction available, Patient being monitored and Timeout performed Patient Re-evaluated:Patient Re-evaluated prior to induction Oxygen Delivery Method: Circle system utilized Preoxygenation: Pre-oxygenation with 100% oxygen Induction Type: IV induction Ventilation: Mask ventilation without difficulty Laryngoscope Size: Miller and 3 Grade View: Grade I Tube type: Oral Tube size: 7.5 mm Number of attempts: 1 Airway Equipment and Method: Stylet Placement Confirmation: ETT inserted through vocal cords under direct vision, positive ETCO2 and breath sounds checked- equal and bilateral Secured at: 22 cm Tube secured with: Tape Dental Injury: Teeth and Oropharynx as per pre-operative assessment

## 2020-10-16 NOTE — Progress Notes (Addendum)
Bp 175/117 Scott Napoleon, Rn informed. Dr Desmond Lope informed

## 2020-10-17 ENCOUNTER — Encounter (HOSPITAL_COMMUNITY): Payer: Self-pay | Admitting: Internal Medicine

## 2020-10-17 ENCOUNTER — Telehealth: Payer: Self-pay | Admitting: Cardiology

## 2020-10-17 MED FILL — Heparin Sod (Porcine)-NaCl IV Soln 1000 Unit/500ML-0.9%: INTRAVENOUS | Qty: 500 | Status: AC

## 2020-10-17 NOTE — Anesthesia Postprocedure Evaluation (Signed)
Anesthesia Post Note  Patient: Scott Wagner  Procedure(s) Performed: ATRIAL FIBRILLATION ABLATION     Patient location during evaluation: Cath Lab Anesthesia Type: General Level of consciousness: awake and alert Pain management: pain level controlled Vital Signs Assessment: post-procedure vital signs reviewed and stable Respiratory status: spontaneous breathing, nonlabored ventilation and respiratory function stable Cardiovascular status: blood pressure returned to baseline and stable Postop Assessment: no apparent nausea or vomiting Anesthetic complications: no   No notable events documented.  Last Vitals:  Vitals:   10/16/20 1400 10/16/20 1430  BP: 137/70 139/78  Pulse: 65 76  Resp: 13 18  Temp:    SpO2: 96% 98%    Last Pain:  Vitals:   10/16/20 1258  TempSrc:   PainSc: 0-No pain                 Cecile Hearing

## 2020-10-17 NOTE — Telephone Encounter (Signed)
Gave patient after care instructions. Verbalized understanding.

## 2020-10-17 NOTE — Telephone Encounter (Signed)
On-call [facility] Pt is s/p AF ablation done by EP at [facility] yesterday ([place]).  I have been contacted by ED in [place] and notified that pt presented there earlier this evening c/o CP. Mildly elevated HS troponin, but workup otherwise unremarkable up to this point. ED questioned whether to transfer pt to [facility] or have him evaluated by [facility] cardiologists at their facility. Currently there are no beds available at [facility] for transfer; my recommendation was to keep pt in [place] and have cardiology see him there to avoid delay in care. Will notify Dr. Johney Frame of pt's ED visit/possible admission.  Precious Reel, MD , [facility] 10/17/20 11:38 PM

## 2020-11-13 ENCOUNTER — Encounter (HOSPITAL_COMMUNITY): Payer: Self-pay | Admitting: Nurse Practitioner

## 2020-11-13 ENCOUNTER — Ambulatory Visit (HOSPITAL_COMMUNITY)
Admission: RE | Admit: 2020-11-13 | Discharge: 2020-11-13 | Disposition: A | Discharge status: Home / Self Care | Payer: Medicare Other | Source: Ambulatory Visit | Attending: Nurse Practitioner | Admitting: Nurse Practitioner

## 2020-11-13 ENCOUNTER — Other Ambulatory Visit: Payer: Self-pay

## 2020-11-13 VITALS — BP 144/86 | HR 76 | Ht 74.0 in | Wt 165.0 lb

## 2020-11-13 DIAGNOSIS — Z87891 Personal history of nicotine dependence: Secondary | ICD-10-CM | POA: Diagnosis not present

## 2020-11-13 DIAGNOSIS — I484 Atypical atrial flutter: Secondary | ICD-10-CM

## 2020-11-13 DIAGNOSIS — I4892 Unspecified atrial flutter: Secondary | ICD-10-CM | POA: Insufficient documentation

## 2020-11-13 DIAGNOSIS — Z79899 Other long term (current) drug therapy: Secondary | ICD-10-CM | POA: Diagnosis not present

## 2020-11-13 DIAGNOSIS — I4819 Other persistent atrial fibrillation: Secondary | ICD-10-CM | POA: Insufficient documentation

## 2020-11-13 DIAGNOSIS — D6869 Other thrombophilia: Secondary | ICD-10-CM

## 2020-11-13 DIAGNOSIS — Z7901 Long term (current) use of anticoagulants: Secondary | ICD-10-CM | POA: Insufficient documentation

## 2020-11-13 LAB — BASIC METABOLIC PANEL
Anion gap: 8 (ref 5–15)
BUN: 19 mg/dL (ref 8–23)
CO2: 29 mmol/L (ref 22–32)
Calcium: 9.2 mg/dL (ref 8.9–10.3)
Chloride: 104 mmol/L (ref 98–111)
Creatinine, Ser: 1.13 mg/dL (ref 0.61–1.24)
GFR, Estimated: >60 mL/min (ref >60)
Glucose, Bld: 97 mg/dL (ref 70–99)
Potassium: 5 mmol/L (ref 3.5–5.1)
Sodium: 141 mmol/L (ref 135–145)

## 2020-11-13 LAB — CBC
HCT: 41.4 % (ref 39.0–52.0)
Hemoglobin: 13.7 g/dL (ref 13.0–17.0)
MCH: 31.4 pg (ref 26.0–34.0)
MCHC: 33.1 g/dL (ref 30.0–36.0)
MCV: 95 fL (ref 80.0–100.0)
Platelets: 181 10*3/uL (ref 150–400)
RBC: 4.36 MIL/uL (ref 4.22–5.81)
RDW: 13.1 % (ref 11.5–15.5)
WBC: 4.4 10*3/uL (ref 4.0–10.5)
nRBC: 0 % (ref 0.0–0.2)

## 2020-11-13 NOTE — Addendum Note (Signed)
Encounter addended by: Newman Nip, NP on: 11/13/2020 12:15 PM  Actions taken: Clinical Note Signed

## 2020-11-13 NOTE — Progress Notes (Addendum)
Primary Care Physician: Alferd Apa, MD Referring Physician:Dr. Ihsan Nomura is a 65 y.o. male with a h/o paroxysmal afib that has been managed well in the past with flecainide and metoprolol. His metoprolol dose was lowered earlier in the year for bradycardia in SR. He has had 4 episodes since the dose was lowered and this last episode has been persistent since last week. EKG shows atrial flutter with variable rate, with v rates in the 50's. Highest v rates have been in 80-90 bpm with exertion. He had a NICM when first dx with afib in 2008. LHC at that time showed normal coronary arteries and EF of 50%. Lat echo 03/2020, showed normal EF with severely dilated left atrium with a diameter of 4.70 and volume of 185 ml.   He denies any alcohol, tobacco, substance abuse,  excessive caffeine. He denies snoring unless he is on his back. No prior sleep study.   He has a CHA2DS2VASc score of 2(HTN, age). Dr. Jens Som started him on eliquis 5 mg bid several months ago but the pt states he did not start it as he was afraid of bleeding side effects. He states he has had 5 cardioversion's in the past, as well as some were TEE guided. He would like to have a CV asap. He voices exertional dyspnea, fatigue with being out of rhythm.    F/u in afib clinic, 07/26/19, he has returned to atrial flutter with variable block, v rates in the 60's, 5 days ago. I saw him late May, he was out of rhythm and cardioversion was planned but he self converted. There were several my chart messages that he stated that he was having fatigue on eliquis and wanted to stop it. He was told by PharmD that it was likely 2/2 the BB not eliquis. He has a h/o hep C and pharmacist at [facility] reassured him that eliquis was safe in that situation. He does not like being on meds. Very fatigued. I discussed ablation or change in antiarrythmic and he would prefer to discuss ablation. I feel even if he is cardioverted, he may go back  to afib/flutter soon, as he appears to be failing flecainide.   F/u in afib clinic, 11/13/20. He had an ablation one month ago and did maintain SR for almost 2 weeks. Sine then he feels he is out of rhythm. He appears to be in an atrial flutter with variable block today. He continues on flecainide and metoprolol as well as eliquis 5 mg bid for a CHA2DS2VASc  score of 2.   Today, he denies symptoms of palpitations, chest pain, shortness of breath, orthopnea, PND, lower extremity edema, dizziness, presyncope, syncope, or neurologic sequela. The patient is tolerating medications without difficulties and is otherwise without complaint today.   Past Medical History:  Diagnosis Date   ATRIAL FIBRILLATION 06/16/2008   Qualifier: Diagnosis of  By: [name], RMA, [name]     Atrial fibrillation (HCC)    Blood in stool    Cardiomyopathy    improved    DJD (degenerative joint disease) of lumbar spine    Gout    Hepatitis C    Hx of echocardiogram    a. Echo 12/13:  EF 60-65%, mod LAE, mild RAE, PASP 34   Intrinsic asthma, unspecified    Substance abuse (HCC)    Unspecified polyarthropathy or polyarthritis, multiple sites    Past Surgical History:  Procedure Laterality Date   ATRIAL FIBRILLATION ABLATION N/A  10/16/2020   Procedure: ATRIAL FIBRILLATION ABLATION;  Surgeon: [name], MD;  Location: [facility] INVASIVE CV LAB;  Service: Cardiovascular;  Laterality: N/A;   HERNIA REPAIR  1966    Current Outpatient Medications  Medication Sig Dispense Refill   albuterol (VENTOLIN HFA) 108 (90 Base) MCG/ACT inhaler Inhale 2 puffs into the lungs every 6 (six) hours as needed.     allopurinol (ZYLOPRIM) 100 MG tablet Take 100 mg by mouth 2 (two) times daily.      apixaban (ELIQUIS) 5 MG TABS tablet Take 1 tablet (5 mg total) by mouth 2 (two) times daily. 60 tablet 5   Ascorbic Acid (VITAMIN C PO) Take by mouth. Taking one chewable by mouth a few times weekly     b complex vitamins capsule Take 1 capsule by  mouth daily.     flecainide (TAMBOCOR) 100 MG tablet Take 1 tablet (100 mg total) by mouth 2 (two) times daily. 180 tablet 3   metoprolol tartrate (LOPRESSOR) 25 MG tablet Take 0.5 tablets (12.5 mg total) by mouth 2 (two) times daily. Take extra 1/2 tablet as needed 90 tablet 3   Multiple Vitamin (MULTIVITAMIN ADULT PO) Take 1 tablet by mouth every morning.     Potassium 95 MG TABS Take 1 tablet by mouth every morning.     No current facility-administered medications for this encounter.    Allergies  Allergen Reactions   Indomethacin Other (See Comments)    Headaches and tired    Social History   Socioeconomic History   Marital status: Married    Spouse name: Not on file   Number of children: Not on file   Years of education: Not on file   Highest education level: Not on file  Occupational History   Not on file  Tobacco Use   Smoking status: Former    Types: Cigarettes    Quit date: 02/02/2014    Years since quitting: 6.7   Smokeless tobacco: Never   Tobacco comments:    started in 1999. smoked 1/4 ppd   Substance and Sexual Activity   Alcohol use: No    Alcohol/week: 0.0 standard drinks    Comment: quit in 2006    Drug use: No    Comment: quit in 2006    Sexual activity: Not on file  Other Topics Concern   Not on file  Social History Narrative   Married; step children; disabled.    Social Determinants of Health   Financial Resource Strain: Not on file  Food Insecurity: Not on file  Transportation Needs: Not on file  Physical Activity: Not on file  Stress: Not on file  Social Connections: Not on file  Intimate Partner Violence: Not on file    Family History  Problem Relation Age of Onset   Heart failure Other        CHF - father and mother. (mother also had severe RA)    ROS- All systems are reviewed and negative except as per the HPI above  Physical Exam: Vitals:   11/13/20 1132  BP: (!) 144/86  Pulse: 76  Weight: 74.8 kg  Height: 6\' 2"  (1.88 m)    Wt Readings from Last 3 Encounters:  11/13/20 74.8 kg  10/16/20 74.8 kg  09/02/20 74.4 kg    Labs: Lab Results  Component Value Date   NA 144 10/04/2020   K 4.8 10/04/2020   CL 108 (H) 10/04/2020   CO2 23 10/04/2020   GLUCOSE 92 10/04/2020  BUN 28 (H) 10/04/2020   CREATININE 1.19 10/04/2020   CALCIUM 9.3 10/04/2020   MG 2.0 08/18/2007   Lab Results  Component Value Date   INR 2.3 10/01/2008   Lab Results  Component Value Date   CHOL  09/15/2008    170        ATP III CLASSIFICATION:  <200     mg/dL   Desirable  914-782  mg/dL   Borderline High  >=956    mg/dL   High          HDL 38 (L) 09/15/2008   LDLCALC (H) 09/15/2008    117        Total Cholesterol/HDL:CHD Risk Coronary Heart Disease Risk Table                     Men   Women  1/2 Average Risk   3.4   3.3  Average Risk       5.0   4.4  2 X Average Risk   9.6   7.1  3 X Average Risk  23.4   11.0        Use the calculated Patient Ratio above and the CHD Risk Table to determine the patient's CHD Risk.        ATP III CLASSIFICATION (LDL):  <100     mg/dL   Optimal  213-086  mg/dL   Near or Above                    Optimal  130-159  mg/dL   Borderline  578-469  mg/dL   High  >629     mg/dL   Very High   TRIG 77 52/84/1324     GEN- The patient is well appearing, alert and oriented x 3 today.   Head- normocephalic, atraumatic Eyes-  Sclera clear, conjunctiva pink Ears- hearing intact Oropharynx- clear Neck- supple, no JVP Lymph- no cervical lymphadenopathy Lungs- Clear to ausculation bilaterally, normal work of breathing Heart-  irregular rate and rhythm, no murmurs, rubs or gallops, PMI not laterally displaced GI- soft, NT, ND, + BS Extremities- no clubbing, cyanosis, or edema MS- no significant deformity or atrophy Skin- no rash or lesion Psych- euthymic mood, full affect Neuro- strength and sensation are intact  EKG- atrial flutter at 76  bpm, qrs int 124 ms, qtc 474 ms   Echo- FINDINGS  -03/22/20 1. Left ventricular ejection fraction, by estimation, is 55 to 60%. The  left ventricle has normal function. The left ventricle has no regional  wall motion abnormalities. There is mild concentric left ventricular  hypertrophy of the posterior segment.  Left ventricular diastolic parameters are indeterminate. The average left  ventricular global longitudinal strain is -19.1 %. The global longitudinal  strain is normal.   2. Right ventricular systolic function is normal. The right ventricular  size is normal.   3. Left atrial size was severely dilated. LA diam 4.70 cm but with volume of 185 ml  4. Right atrial size was severely dilated.   5. The mitral valve is normal in structure. Mild mitral valve  regurgitation. No evidence of mitral stenosis.   6. Tricuspid valve regurgitation is mild to moderate.   7. The aortic valve is normal in structure. Aortic valve regurgitation is  not visualized. No aortic stenosis is present.   8. The inferior vena cava is normal in size with greater than 50%    Assessment and Plan: 1. H/o of afib, persistent  atrial flutter S/p ablation  one month ago Out of rhythm for the last 2 weeks  Tolerating well   Will plan on cardioversion  Continue metoprolol tartrate 12.5 mg bid  Continue  flecainide 100 mg bid  Bmet/mag  2. CHA2DS2VASc score of 2 Continue eliquis 5 mg bid  States he has not missed any anticoagulation for at least 3 week s   F/u one week after cardioversion   Lupita Leash C[facility] [address] [place], [place] [address] [phone]

## 2020-11-13 NOTE — H&P (View-Only) (Signed)
 Primary Care Physician: Dsa, Joylin G, MD Referring Physician:Dr. Crenshaw    Scott Wagner is a 65 y.o. male with a h/o paroxysmal afib that has been managed well in the past with flecainide and metoprolol. His metoprolol dose was lowered earlier in the year for bradycardia in SR. He has had 4 episodes since the dose was lowered and this last episode has been persistent since last week. EKG shows atrial flutter with variable rate, with v rates in the 50's. Highest v rates have been in 80-90 bpm with exertion. He had a NICM when first dx with afib in 2008. LHC at that time showed normal coronary arteries and EF of 50%. Lat echo 03/2020, showed normal EF with severely dilated left atrium with a diameter of 4.70 and volume of 185 ml.   He denies any alcohol, tobacco, substance abuse,  excessive caffeine. He denies snoring unless he is on his back. No prior sleep study.   He has a CHA2DS2VASc score of 2(HTN, age). Dr. Crenshaw started him on eliquis 5 mg bid several months ago but the pt states he did not start it as he was afraid of bleeding side effects. He states he has had 5 cardioversion's in the past, as well as some were TEE guided. He would like to have a CV asap. He voices exertional dyspnea, fatigue with being out of rhythm.    F/u in afib clinic, 07/26/19, he has returned to atrial flutter with variable block, v rates in the 60's, 5 days ago. I saw him late May, he was out of rhythm and cardioversion was planned but he self converted. There were several my chart messages that he stated that he was having fatigue on eliquis and wanted to stop it. He was told by PharmD that it was likely 2/2 the BB not eliquis. He has a h/o hep C and pharmacist at [facility] reassured him that eliquis was safe in that situation. He does not like being on meds. Very fatigued. I discussed ablation or change in antiarrythmic and he would prefer to discuss ablation. I feel even if he is cardioverted, he may go back  to afib/flutter soon, as he appears to be failing flecainide.   F/u in afib clinic, 11/13/20. He had an ablation one month ago and did maintain SR for almost 2 weeks. Sine then he feels he is out of rhythm. He appears to be in an atrial flutter with variable block today. He continues on flecainide and metoprolol as well as eliquis 5 mg bid for a CHA2DS2VASc  score of 2.   Today, he denies symptoms of palpitations, chest pain, shortness of breath, orthopnea, PND, lower extremity edema, dizziness, presyncope, syncope, or neurologic sequela. The patient is tolerating medications without difficulties and is otherwise without complaint today.   Past Medical History:  Diagnosis Date   ATRIAL FIBRILLATION 06/16/2008   Qualifier: Diagnosis of  By: Frazier, RMA, [name]     Atrial fibrillation (HCC)    Blood in stool    Cardiomyopathy    improved    DJD (degenerative joint disease) of lumbar spine    Gout    Hepatitis C    Hx of echocardiogram    a. Echo 12/13:  EF 60-65%, mod LAE, mild RAE, PASP 34   Intrinsic asthma, unspecified    Substance abuse (HCC)    Unspecified polyarthropathy or polyarthritis, multiple sites    Past Surgical History:  Procedure Laterality Date   ATRIAL FIBRILLATION ABLATION N/A   10/16/2020   Procedure: ATRIAL FIBRILLATION ABLATION;  Surgeon: [name], MD;  Location: [facility];  Service: Cardiovascular;  Laterality: N/A;   HERNIA REPAIR  1966    Current Outpatient Medications  Medication Sig Dispense Refill   albuterol (VENTOLIN HFA) 108 (90 Base) MCG/ACT inhaler Inhale 2 puffs into the lungs every 6 (six) hours as needed.     allopurinol (ZYLOPRIM) 100 MG tablet Take 100 mg by mouth 2 (two) times daily.      apixaban (ELIQUIS) 5 MG TABS tablet Take 1 tablet (5 mg total) by mouth 2 (two) times daily. 60 tablet 5   Ascorbic Acid (VITAMIN C PO) Take by mouth. Taking one chewable by mouth a few times weekly     b complex vitamins capsule Take 1 capsule by  mouth daily.     flecainide (TAMBOCOR) 100 MG tablet Take 1 tablet (100 mg total) by mouth 2 (two) times daily. 180 tablet 3   metoprolol tartrate (LOPRESSOR) 25 MG tablet Take 0.5 tablets (12.5 mg total) by mouth 2 (two) times daily. Take extra 1/2 tablet as needed 90 tablet 3   Multiple Vitamin (MULTIVITAMIN ADULT PO) Take 1 tablet by mouth every morning.     Potassium 95 MG TABS Take 1 tablet by mouth every morning.     No current facility-administered medications for this encounter.    Allergies  Allergen Reactions   Indomethacin Other (See Comments)    Headaches and tired    Social History   Socioeconomic History   Marital status: Married    Spouse name: Not on file   Number of children: Not on file   Years of education: Not on file   Highest education level: Not on file  Occupational History   Not on file  Tobacco Use   Smoking status: Former    Types: Cigarettes    Quit date: 02/02/2014    Years since quitting: 6.7   Smokeless tobacco: Never   Tobacco comments:    started in 1999. smoked 1/4 ppd   Substance and Sexual Activity   Alcohol use: No    Alcohol/week: 0.0 standard drinks    Comment: quit in 2006    Drug use: No    Comment: quit in 2006    Sexual activity: Not on file  Other Topics Concern   Not on file  Social History Narrative   Married; step children; disabled.    Social Determinants of Health   Financial Resource Strain: Not on file  Food Insecurity: Not on file  Transportation Needs: Not on file  Physical Activity: Not on file  Stress: Not on file  Social Connections: Not on file  Intimate Partner Violence: Not on file    Family History  Problem Relation Age of Onset   Heart failure Other        CHF - father and mother. (mother also had severe RA)    ROS- All systems are reviewed and negative except as per the HPI above  Physical Exam: Vitals:   11/13/20 1132  BP: (!) 144/86  Pulse: 76  Weight: 74.8 kg  Height: 6\' 2"  (1.88 m)    Wt Readings from Last 3 Encounters:  11/13/20 74.8 kg  10/16/20 74.8 kg  09/02/20 74.4 kg    Labs: Lab Results  Component Value Date   NA 144 10/04/2020   K 4.8 10/04/2020   CL 108 (H) 10/04/2020   CO2 23 10/04/2020   GLUCOSE 92 10/04/2020  BUN 28 (H) 10/04/2020   CREATININE 1.19 10/04/2020   CALCIUM 9.3 10/04/2020   MG 2.0 08/18/2007   Lab Results  Component Value Date   INR 2.3 10/01/2008   Lab Results  Component Value Date   CHOL  09/15/2008    170        ATP III CLASSIFICATION:  <200     mg/dL   Desirable  914-782  mg/dL   Borderline High  >=956    mg/dL   High          HDL 38 (L) 09/15/2008   LDLCALC (H) 09/15/2008    117        Total Cholesterol/HDL:CHD Risk Coronary Heart Disease Risk Table                     Men   Women  1/2 Average Risk   3.4   3.3  Average Risk       5.0   4.4  2 X Average Risk   9.6   7.1  3 X Average Risk  23.4   11.0        Use the calculated Patient Ratio above and the CHD Risk Table to determine the patient's CHD Risk.        ATP III CLASSIFICATION (LDL):  <100     mg/dL   Optimal  213-086  mg/dL   Near or Above                    Optimal  130-159  mg/dL   Borderline  578-469  mg/dL   High  >629     mg/dL   Very High   TRIG 77 52/84/1324     GEN- The patient is well appearing, alert and oriented x 3 today.   Head- normocephalic, atraumatic Eyes-  Sclera clear, conjunctiva pink Ears- hearing intact Oropharynx- clear Neck- supple, no JVP Lymph- no cervical lymphadenopathy Lungs- Clear to ausculation bilaterally, normal work of breathing Heart-  irregular rate and rhythm, no murmurs, rubs or gallops, PMI not laterally displaced GI- soft, NT, ND, + BS Extremities- no clubbing, cyanosis, or edema MS- no significant deformity or atrophy Skin- no rash or lesion Psych- euthymic mood, full affect Neuro- strength and sensation are intact  EKG- atrial flutter at 76  bpm, qrs int 124 ms, qtc 474 ms   Echo- FINDINGS  -03/22/20 1. Left ventricular ejection fraction, by estimation, is 55 to 60%. The  left ventricle has normal function. The left ventricle has no regional  wall motion abnormalities. There is mild concentric left ventricular  hypertrophy of the posterior segment.  Left ventricular diastolic parameters are indeterminate. The average left  ventricular global longitudinal strain is -19.1 %. The global longitudinal  strain is normal.   2. Right ventricular systolic function is normal. The right ventricular  size is normal.   3. Left atrial size was severely dilated. LA diam 4.70 cm but with volume of 185 ml  4. Right atrial size was severely dilated.   5. The mitral valve is normal in structure. Mild mitral valve  regurgitation. No evidence of mitral stenosis.   6. Tricuspid valve regurgitation is mild to moderate.   7. The aortic valve is normal in structure. Aortic valve regurgitation is  not visualized. No aortic stenosis is present.   8. The inferior vena cava is normal in size with greater than 50%    Assessment and Plan: 1. H/o of afib, persistent  atrial flutter S/p ablation  one month ago Out of rhythm for the last 2 weeks  Tolerating well   Will plan on cardioversion  Continue metoprolol tartrate 12.5 mg bid  Continue  flecainide 100 mg bid  Bmet/mag  2. CHA2DS2VASc score of 2 Continue eliquis 5 mg bid  States he has not missed any anticoagulation for at least 3 week s   F/u one week after cardioversion   Lupita Leash [facility] [address] [place], [place] [address] [phone]

## 2020-11-13 NOTE — Patient Instructions (Signed)
Cardioversion scheduled for Wednesday, October 19th  - Arrive at the [facility] and go to admitting at 12PM  - Do not eat or drink anything after midnight the night prior to your procedure.  - Take all your morning medication (except diabetic medications) with a sip of water prior to arrival.  - You will not be able to drive home after your procedure.  - Do NOT miss any doses of your blood thinner - if you should miss a dose please notify our office immediately.  - If you feel as if you go back into normal rhythm prior to scheduled cardioversion, please notify our office immediately. If your procedure is canceled in the cardioversion suite you will be charged a cancellation fee.  Patients will be asked to: to mask in public and hand hygiene (no longer quarantine) in the 3 days prior to surgery, to report if any COVID-19-like illness or household contacts to COVID-19 to determine need for testing

## 2020-11-20 ENCOUNTER — Ambulatory Visit (HOSPITAL_COMMUNITY): Admission: RE | Admit: 2020-11-20 | Payer: Medicare Other | Source: Ambulatory Visit | Admitting: Nurse Practitioner

## 2020-11-20 ENCOUNTER — Encounter (HOSPITAL_COMMUNITY): Payer: Self-pay

## 2020-11-20 ENCOUNTER — Other Ambulatory Visit: Payer: Self-pay

## 2020-11-20 ENCOUNTER — Telehealth (HOSPITAL_COMMUNITY): Payer: Self-pay | Admitting: *Deleted

## 2020-11-20 ENCOUNTER — Ambulatory Visit (HOSPITAL_COMMUNITY): Payer: Medicare Other | Admitting: Certified Registered Nurse Anesthetist

## 2020-11-20 ENCOUNTER — Encounter (HOSPITAL_COMMUNITY)
Admission: RE | Disposition: A | Discharge status: Home / Self Care | Payer: Self-pay | Source: Home / Self Care | Attending: Cardiology

## 2020-11-20 ENCOUNTER — Encounter (HOSPITAL_COMMUNITY): Payer: Self-pay | Admitting: Cardiology

## 2020-11-20 ENCOUNTER — Ambulatory Visit (HOSPITAL_COMMUNITY)
Admission: RE | Admit: 2020-11-20 | Discharge: 2020-11-20 | Disposition: A | Discharge status: Home / Self Care | Payer: Medicare Other | Attending: Cardiology | Admitting: Cardiology

## 2020-11-20 DIAGNOSIS — Z87891 Personal history of nicotine dependence: Secondary | ICD-10-CM | POA: Diagnosis not present

## 2020-11-20 DIAGNOSIS — Z79899 Other long term (current) drug therapy: Secondary | ICD-10-CM | POA: Diagnosis not present

## 2020-11-20 DIAGNOSIS — I4819 Other persistent atrial fibrillation: Secondary | ICD-10-CM | POA: Insufficient documentation

## 2020-11-20 DIAGNOSIS — Z7901 Long term (current) use of anticoagulants: Secondary | ICD-10-CM | POA: Diagnosis not present

## 2020-11-20 DIAGNOSIS — I4892 Unspecified atrial flutter: Secondary | ICD-10-CM | POA: Diagnosis not present

## 2020-11-20 HISTORY — PX: CARDIOVERSION: SHX1299

## 2020-11-20 SURGERY — CARDIOVERSION
Anesthesia: General

## 2020-11-20 MED ORDER — LIDOCAINE 2% (20 MG/ML) 5 ML SYRINGE
INTRAMUSCULAR | Status: DC | PRN
  Administered 2020-11-20: 40 mg via INTRAVENOUS

## 2020-11-20 MED ORDER — PROPOFOL 10 MG/ML IV BOLUS
INTRAVENOUS | Status: DC | PRN
  Administered 2020-11-20: 20 mg via INTRAVENOUS
  Administered 2020-11-20: 60 mg via INTRAVENOUS

## 2020-11-20 MED ORDER — SODIUM CHLORIDE 0.9 % IV SOLN: INTRAVENOUS | Status: DC | PRN

## 2020-11-20 NOTE — Interval H&P Note (Signed)
History and Physical Interval Note:  11/20/2020 12:44 PM  Scott Wagner  has presented today for surgery, with the diagnosis of AFIB.  The various methods of treatment have been discussed with the patient and family. After consideration of risks, benefits and other options for treatment, the patient has consented to  Procedure(s): CARDIOVERSION (N/A) as a surgical intervention.  The patient's history has been reviewed, patient examined, no change in status, stable for surgery.  I have reviewed the patient's chart and labs.  Questions were answered to the patient's satisfaction.     Armanda Magic

## 2020-11-20 NOTE — Transfer of Care (Signed)
Immediate Anesthesia Transfer of Care Note  Patient: Scott Wagner  Procedure(s) Performed: CARDIOVERSION  Patient Location: Endoscopy Unit  Anesthesia Type:General  Level of Consciousness: awake, alert  and oriented  Airway & Oxygen Therapy: Patient Spontanous Breathing  Post-op Assessment: Report given to RN and Post -op Vital signs reviewed and stable  Post vital signs: Reviewed and stable  Last Vitals:  Vitals Value Taken Time  BP 147/87   Temp    Pulse 68   Resp 12   SpO2 96     Last Pain:  Vitals:   11/20/20 1230  TempSrc: Temporal  PainSc: 0-No pain         Complications: No notable events documented.

## 2020-11-20 NOTE — CV Procedure (Addendum)
   Electrical Cardioversion Procedure Note [name] [id number] 1955-02-25  Procedure: Electrical Cardioversion Indications:  Atrial Flutter  Time Out: Verified patient identification, verified procedure,medications/allergies/relevent history reviewed, required imaging and test results available.  Performed  Procedure Details  The patient was NPO after midnight. Anesthesia was administered at the beside  by Dr.Hollis with 40mg  Lidocaine and 80mg  of propofol.  Cardioversion was done with synchronized biphasic defibrillation with AP pads with 150watts.  The patient converted to normal sinus rhythm but went back into atrial flutter with variable block.  Cardioversion was done with synchronized biphasic defibrillation with AP pads with 200watts.  The patient converted to normal sinus rhythm . The patient tolerated the procedure well   IMPRESSION:  Successful cardioversion of atrial Flutter    Scott Wagner 11/20/2020, 12:45 PM

## 2020-11-20 NOTE — Anesthesia Postprocedure Evaluation (Signed)
Anesthesia Post Note  Patient: Scott Wagner  Procedure(s) Performed: CARDIOVERSION     Patient location during evaluation: PACU Anesthesia Type: General Level of consciousness: awake and alert Pain management: pain level controlled Vital Signs Assessment: post-procedure vital signs reviewed and stable Respiratory status: spontaneous breathing, nonlabored ventilation, respiratory function stable and patient connected to nasal cannula oxygen Cardiovascular status: blood pressure returned to baseline and stable Postop Assessment: no apparent nausea or vomiting Anesthetic complications: no   No notable events documented.  Last Vitals:  Vitals:   11/20/20 1325 11/20/20 1335  BP: 132/78 133/68  Pulse: 64 68  Resp: 14 16  Temp:    SpO2: 95% 94%    Last Pain:  Vitals:   11/20/20 1325  TempSrc:   PainSc: 0-No pain                 Shelton Silvas

## 2020-11-20 NOTE — Discharge Instructions (Signed)

## 2020-11-20 NOTE — Anesthesia Procedure Notes (Signed)
Procedure Name: General with mask airway Date/Time: 11/20/2020 1:02 PM Performed by: Lelon Perla, CRNA Pre-anesthesia Checklist: Patient identified, Emergency Drugs available, Suction available, Patient being monitored and Timeout performed Patient Re-evaluated:Patient Re-evaluated prior to induction Oxygen Delivery Method: Ambu bag Preoxygenation: Pre-oxygenation with 100% oxygen Induction Type: IV induction Ventilation: Mask ventilation without difficulty Placement Confirmation: positive ETCO2 and CO2 detector Dental Injury: Teeth and Oropharynx as per pre-operative assessment

## 2020-11-20 NOTE — Telephone Encounter (Signed)
Pt continues AF he had called yesterday thinking he was in rhythm but his heart rates immediately return to elevated rates and knows he is in afib. Sent for cardioversion.

## 2020-11-20 NOTE — Anesthesia Preprocedure Evaluation (Signed)
Anesthesia Evaluation  Patient identified by MRN, date of birth, ID band Patient awake    Reviewed: Allergy & Precautions, NPO status , Patient's Chart, lab work & pertinent test results, reviewed documented beta blocker date and time   Airway Mallampati: I  TM Distance: >3 FB Neck ROM: Full    Dental  (+) Teeth Intact   Pulmonary asthma , former smoker,    breath sounds clear to auscultation       Cardiovascular + dysrhythmias Atrial Fibrillation  Rhythm:Irregular Rate:Abnormal  Echo:  1. Left ventricular ejection fraction, by estimation, is 55 to 60%. The  left ventricle has normal function. The left ventricle has no regional  wall motion abnormalities. There is mild concentric left ventricular  hypertrophy of the posterior segment.  Left ventricular diastolic parameters are indeterminate. The average left  ventricular global longitudinal strain is -19.1 %. The global longitudinal  strain is normal.  2. Right ventricular systolic function is normal. The right ventricular  size is normal.  3. Left atrial size was severely dilated.  4. Right atrial size was severely dilated.  5. The mitral valve is normal in structure. Mild mitral valve  regurgitation. No evidence of mitral stenosis.  6. Tricuspid valve regurgitation is mild to moderate.  7. The aortic valve is normal in structure. Aortic valve regurgitation is  not visualized. No aortic stenosis is present.  8. The inferior vena cava is normal in size with greater than 50%  respiratory variability, suggesting right atrial pressure of 3 mmHg.    Neuro/Psych negative neurological ROS  negative psych ROS   GI/Hepatic negative GI ROS, (+) Hepatitis -, C  Endo/Other  negative endocrine ROS  Renal/GU Renal disease     Musculoskeletal   Abdominal Normal abdominal exam  (+)   Peds  Hematology   Anesthesia Other Findings   Reproductive/Obstetrics                              Anesthesia Physical Anesthesia Plan  ASA: 3  Anesthesia Plan: General   Post-op Pain Management:    Induction: Intravenous  PONV Risk Score and Plan: 0 and Propofol infusion  Airway Management Planned: Natural Airway and Simple Face Mask  Additional Equipment: None  Intra-op Plan:   Post-operative Plan:   Informed Consent: I have reviewed the patients History and Physical, chart, labs and discussed the procedure including the risks, benefits and alternatives for the proposed anesthesia with the patient or authorized representative who has indicated his/her understanding and acceptance.       Plan Discussed with: CRNA  Anesthesia Plan Comments:         Anesthesia Quick Evaluation

## 2020-11-27 ENCOUNTER — Ambulatory Visit (HOSPITAL_COMMUNITY)
Admission: RE | Admit: 2020-11-27 | Discharge: 2020-11-27 | Disposition: A | Discharge status: Home / Self Care | Payer: Medicare Other | Source: Ambulatory Visit | Attending: Nurse Practitioner | Admitting: Nurse Practitioner

## 2020-11-27 ENCOUNTER — Other Ambulatory Visit: Payer: Self-pay

## 2020-11-27 VITALS — BP 158/76 | HR 55 | Ht 74.0 in | Wt 164.8 lb

## 2020-11-27 DIAGNOSIS — Z79899 Other long term (current) drug therapy: Secondary | ICD-10-CM | POA: Diagnosis not present

## 2020-11-27 DIAGNOSIS — Z87891 Personal history of nicotine dependence: Secondary | ICD-10-CM | POA: Insufficient documentation

## 2020-11-27 DIAGNOSIS — I480 Paroxysmal atrial fibrillation: Secondary | ICD-10-CM

## 2020-11-27 DIAGNOSIS — Z7901 Long term (current) use of anticoagulants: Secondary | ICD-10-CM | POA: Insufficient documentation

## 2020-11-27 DIAGNOSIS — Z09 Encounter for follow-up examination after completed treatment for conditions other than malignant neoplasm: Secondary | ICD-10-CM | POA: Diagnosis not present

## 2020-11-27 DIAGNOSIS — I4819 Other persistent atrial fibrillation: Secondary | ICD-10-CM | POA: Diagnosis present

## 2020-11-27 DIAGNOSIS — D6869 Other thrombophilia: Secondary | ICD-10-CM | POA: Diagnosis not present

## 2020-11-27 DIAGNOSIS — I4892 Unspecified atrial flutter: Secondary | ICD-10-CM | POA: Diagnosis not present

## 2020-11-27 NOTE — Progress Notes (Signed)
Primary Care Physician: Alferd Apa, MD Referring Physician:Dr. Brandin Wagner is a 65 y.o. male with a h/o paroxysmal afib that has been managed well in the past with flecainide and metoprolol. His metoprolol dose was lowered earlier in the year for bradycardia in SR. He has had 4 episodes since the dose was lowered and this last episode has been persistent since last week. EKG shows atrial flutter with variable rate, with v rates in the 50's. Highest v rates have been in 80-90 bpm with exertion. He had a NICM when first dx with afib in 2008. LHC at that time showed normal coronary arteries and EF of 50%. Lat echo 03/2020, showed normal EF with severely dilated left atrium with a diameter of 4.70 and volume of 185 ml.   He denies any alcohol, tobacco, substance abuse,  excessive caffeine. He denies snoring unless he is on his back. No prior sleep study.   He has a CHA2DS2VASc score of 2(HTN, age). Dr. Jens Som started him on eliquis 5 mg bid several months ago but the pt states he did not start it as he was afraid of bleeding side effects. He states he has had 5 cardioversion's in the past, as well as some were TEE guided. He would like to have a CV asap. He voices exertional dyspnea, fatigue with being out of rhythm.    F/u in afib clinic, 07/26/19, he has returned to atrial flutter with variable block, v rates in the 60's, 5 days ago. I saw him late May, he was out of rhythm and cardioversion was planned but he self converted. There were several my chart messages that he stated that he was having fatigue on eliquis and wanted to stop it. He was told by PharmD that it was likely 2/2 the BB not eliquis. He has a h/o hep C and pharmacist at [facility] reassured him that eliquis was safe in that situation. He does not like being on meds. Very fatigued. I discussed ablation or change in antiarrythmic and he would prefer to discuss ablation. I feel even if he is cardioverted, he may go back  to afib/flutter soon, as he appears to be failing flecainide.   F/u in afib clinic, 11/13/20. He had an ablation one month ago and did maintain SR for almost 2 weeks. Since then he feels he is out of rhythm. He appears to be in an atrial flutter with variable block today. He continues on flecainide and metoprolol as well as eliquis 5 mg bid for a CHA2DS2VASc  score of 2. We discussed pursing cardioversion and he was in agreement.  F/u 10/26 in afib clinic. Had a successful cardioversion 11/20/20. He is in SR today. He remains on flecainide. No swallowing or groin issues since the procedure. Continues on eliquis. He feels better post cardioversion with return of SR.   Today, he denies symptoms of palpitations, chest pain, shortness of breath, orthopnea, PND, lower extremity edema, dizziness, presyncope, syncope, or neurologic sequela. The patient is tolerating medications without difficulties and is otherwise without complaint today.   Past Medical History:  Diagnosis Date   ATRIAL FIBRILLATION 06/16/2008   Qualifier: Diagnosis of  By: [name], RMA, [name]     Atrial fibrillation (HCC)    Blood in stool    Cardiomyopathy    improved    DJD (degenerative joint disease) of lumbar spine    Gout    Hepatitis C    Hx of echocardiogram  a. Echo 12/13:  EF 60-65%, mod LAE, mild RAE, PASP 34   Intrinsic asthma, unspecified    Substance abuse (HCC)    Unspecified polyarthropathy or polyarthritis, multiple sites    Past Surgical History:  Procedure Laterality Date   ATRIAL FIBRILLATION ABLATION N/A 10/16/2020   Procedure: ATRIAL FIBRILLATION ABLATION;  Surgeon: [name], MD;  Location: [facility];  Service: Cardiovascular;  Laterality: N/A;   CARDIOVERSION N/A 11/20/2020   Procedure: CARDIOVERSION;  Surgeon: Quintella Reichert, MD;  Location: [facility];  Service: Cardiovascular;  Laterality: N/A;   HERNIA REPAIR  1966    Current Outpatient Medications  Medication Sig Dispense  Refill   albuterol (VENTOLIN HFA) 108 (90 Base) MCG/ACT inhaler Inhale 2 puffs into the lungs every 6 (six) hours as needed for wheezing or shortness of breath.     allopurinol (ZYLOPRIM) 100 MG tablet Take 100 mg by mouth 2 (two) times daily.      apixaban (ELIQUIS) 5 MG TABS tablet Take 1 tablet (5 mg total) by mouth 2 (two) times daily. 60 tablet 5   B Complex-C (B-COMPLEX WITH VITAMIN C) tablet Take 1 tablet by mouth daily.     flecainide (TAMBOCOR) 100 MG tablet Take 1 tablet (100 mg total) by mouth 2 (two) times daily. 180 tablet 3   Glycerin (CLEAR EYES ADV DRY & ITCHY RLF) 0.25 % SOLN Place 1-2 drops into both eyes 3 (three) times daily as needed.     metoprolol tartrate (LOPRESSOR) 25 MG tablet Take 0.5 tablets (12.5 mg total) by mouth 2 (two) times daily. Take extra 1/2 tablet as needed 90 tablet 3   Multiple Vitamin (MULTIVITAMIN WITH MINERALS) TABS tablet Take 1 tablet by mouth in the morning.     Potassium 99 MG TABS Take 99 mg by mouth in the morning.     vitamin C (ASCORBIC ACID) 500 MG tablet Take 500 mg by mouth daily.     No current facility-administered medications for this encounter.    Allergies  Allergen Reactions   Indomethacin Other (See Comments)    Headaches and tired    Social History   Socioeconomic History   Marital status: Married    Spouse name: Not on file   Number of children: Not on file   Years of education: Not on file   Highest education level: Not on file  Occupational History   Not on file  Tobacco Use   Smoking status: Former    Types: Cigarettes    Quit date: 02/02/2014    Years since quitting: 6.8   Smokeless tobacco: Never   Tobacco comments:    started in 1999. smoked 1/4 ppd   Substance and Sexual Activity   Alcohol use: No    Alcohol/week: 0.0 standard drinks    Comment: quit in 2006    Drug use: No    Comment: quit in 2006    Sexual activity: Not on file  Other Topics Concern   Not on file  Social History Narrative    Married; step children; disabled.    Social Determinants of Health   Financial Resource Strain: Not on file  Food Insecurity: Not on file  Transportation Needs: Not on file  Physical Activity: Not on file  Stress: Not on file  Social Connections: Not on file  Intimate Partner Violence: Not on file    Family History  Problem Relation Age of Onset   Heart failure Other  CHF - father and mother. (mother also had severe RA)    ROS- All systems are reviewed and negative except as per the HPI above  Physical Exam: There were no vitals filed for this visit.  Wt Readings from Last 3 Encounters:  11/20/20 74.8 kg  11/13/20 74.8 kg  10/16/20 74.8 kg    Labs: Lab Results  Component Value Date   NA 141 11/13/2020   K 5.0 11/13/2020   CL 104 11/13/2020   CO2 29 11/13/2020   GLUCOSE 97 11/13/2020   BUN 19 11/13/2020   CREATININE 1.13 11/13/2020   CALCIUM 9.2 11/13/2020   MG 2.0 08/18/2007   Lab Results  Component Value Date   INR 2.3 10/01/2008   Lab Results  Component Value Date   CHOL  09/15/2008    170        ATP III CLASSIFICATION:  <200     mg/dL   Desirable  195-093  mg/dL   Borderline High  >=267    mg/dL   High          HDL 38 (L) 09/15/2008   LDLCALC (H) 09/15/2008    117        Total Cholesterol/HDL:CHD Risk Coronary Heart Disease Risk Table                     Men   Women  1/2 Average Risk   3.4   3.3  Average Risk       5.0   4.4  2 X Average Risk   9.6   7.1  3 X Average Risk  23.4   11.0        Use the calculated Patient Ratio above and the CHD Risk Table to determine the patient's CHD Risk.        ATP III CLASSIFICATION (LDL):  <100     mg/dL   Optimal  124-580  mg/dL   Near or Above                    Optimal  130-159  mg/dL   Borderline  998-338  mg/dL   High  >250     mg/dL   Very High   TRIG 77 53/97/6734     GEN- The patient is well appearing, alert and oriented x 3 today.   Head- normocephalic, atraumatic Eyes-  Sclera  clear, conjunctiva pink Ears- hearing intact Oropharynx- clear Neck- supple, no JVP Lymph- no cervical lymphadenopathy Lungs- Clear to ausculation bilaterally, normal work of breathing Heart-  regular rate and rhythm, no murmurs, rubs or gallops, PMI not laterally displaced GI- soft, NT, ND, + BS Extremities- no clubbing, cyanosis, or edema MS- no significant deformity or atrophy Skin- no rash or lesion Psych- euthymic mood, full affect Neuro- strength and sensation are intact  EKG- sinus brady with first degree AV block with PAC's   Echo- FINDINGS -03/22/20 1. Left ventricular ejection fraction, by estimation, is 55 to 60%. The  left ventricle has normal function. The left ventricle has no regional  wall motion abnormalities. There is mild concentric left ventricular  hypertrophy of the posterior segment.  Left ventricular diastolic parameters are indeterminate. The average left  ventricular global longitudinal strain is -19.1 %. The global longitudinal  strain is normal.   2. Right ventricular systolic function is normal. The right ventricular  size is normal.   3. Left atrial size was severely dilated. LA diam 4.70 cm but with volume of  185 ml  4. Right atrial size was severely dilated.   5. The mitral valve is normal in structure. Mild mitral valve  regurgitation. No evidence of mitral stenosis.   6. Tricuspid valve regurgitation is mild to moderate.   7. The aortic valve is normal in structure. Aortic valve regurgitation is  not visualized. No aortic stenosis is present.   8. The inferior vena cava is normal in size with greater than 50%    Assessment and Plan: 1. H/o of afib, persistent atrial flutter  S/p ablation  10/16/20 Out of rhythm  after ablation after 2 weeks in SR   Successful cardioversion  Continue metoprolol tartrate 12.5 mg bid  Continue  flecainide 100 mg bid   2. CHA2DS2VASc score of 2 Continue eliquis 5 mg bid     F/u  with Dr. Johney Wagner  01/20/21  [facility] [address] [place], [place] [address] [phone]

## 2020-12-09 ENCOUNTER — Ambulatory Visit: Payer: Medicare Other | Admitting: Cardiology

## 2020-12-18 ENCOUNTER — Encounter (HOSPITAL_COMMUNITY): Payer: Self-pay | Admitting: Physician Assistant

## 2020-12-18 ENCOUNTER — Ambulatory Visit (HOSPITAL_COMMUNITY): Payer: Medicare Other | Admitting: Nurse Practitioner

## 2020-12-18 ENCOUNTER — Ambulatory Visit (HOSPITAL_COMMUNITY)
Admission: RE | Admit: 2020-12-18 | Discharge: 2020-12-18 | Disposition: A | Discharge status: Home / Self Care | Payer: Medicare Other | Source: Ambulatory Visit | Attending: Nurse Practitioner | Admitting: Nurse Practitioner

## 2020-12-18 VITALS — BP 136/90 | HR 78 | Ht 74.0 in | Wt 166.0 lb

## 2020-12-18 DIAGNOSIS — I10 Essential (primary) hypertension: Secondary | ICD-10-CM | POA: Insufficient documentation

## 2020-12-18 DIAGNOSIS — Z79899 Other long term (current) drug therapy: Secondary | ICD-10-CM | POA: Insufficient documentation

## 2020-12-18 DIAGNOSIS — I4819 Other persistent atrial fibrillation: Secondary | ICD-10-CM | POA: Insufficient documentation

## 2020-12-18 DIAGNOSIS — D6869 Other thrombophilia: Secondary | ICD-10-CM | POA: Diagnosis not present

## 2020-12-18 DIAGNOSIS — Z7901 Long term (current) use of anticoagulants: Secondary | ICD-10-CM | POA: Diagnosis not present

## 2020-12-18 LAB — CBC
HCT: 46.3 % (ref 39.0–52.0)
Hemoglobin: 15.1 g/dL (ref 13.0–17.0)
MCH: 30.8 pg (ref 26.0–34.0)
MCHC: 32.6 g/dL (ref 30.0–36.0)
MCV: 94.5 fL (ref 80.0–100.0)
Platelets: 203 10*3/uL (ref 150–400)
RBC: 4.9 MIL/uL (ref 4.22–5.81)
RDW: 12.7 % (ref 11.5–15.5)
WBC: 5.2 10*3/uL (ref 4.0–10.5)
nRBC: 0 % (ref 0.0–0.2)

## 2020-12-18 LAB — BASIC METABOLIC PANEL
Anion gap: 9 (ref 5–15)
BUN: 20 mg/dL (ref 8–23)
CO2: 27 mmol/L (ref 22–32)
Calcium: 9 mg/dL (ref 8.9–10.3)
Chloride: 106 mmol/L (ref 98–111)
Creatinine, Ser: 1.24 mg/dL (ref 0.61–1.24)
GFR, Estimated: >60 mL/min (ref >60)
Glucose, Bld: 97 mg/dL (ref 70–99)
Potassium: 4.6 mmol/L (ref 3.5–5.1)
Sodium: 142 mmol/L (ref 135–145)

## 2020-12-18 LAB — MAGNESIUM: Magnesium: 2 mg/dL (ref 1.7–2.4)

## 2020-12-18 MED ORDER — METOPROLOL TARTRATE 25 MG PO TABS: 25.0000 mg | ORAL_TABLET | Freq: Two times a day (BID) | ORAL | 3 refills | Status: DC

## 2020-12-18 NOTE — Progress Notes (Signed)
Primary Care Physician: Alferd Apa, MD Referring Physician:Dr. 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 His metoprolol dose was lowered earlier in the year for bradycardia in SR. He has had 4 episodes since the dose was lowered and this last episode has been persistent since last week. EKG shows atrial flutter with variable rate, with v rates in the 50's. Highest v rates have been in 80-90 bpm with exertion. He had a NICM when first dx with afib in 2008. LHC at that time showed normal coronary arteries and EF of 50%. Lat echo 03/2020, showed normal EF with severely dilated left atrium with a diameter of 4.70 and volume of 185 ml.   He denies any alcohol, tobacco, substance abuse,  excessive caffeine. He denies snoring unless he is on his back. No prior sleep study.   He has a CHA2DS2VASc score of 2(HTN, age). Dr. Jens Som started him on eliquis 5 mg bid several months ago but the pt states he did not start it as he was afraid of bleeding side effects. He states he has had 5 cardioversion's in the past, as well as some were TEE guided. He would like to have a CV asap. He voices exertional dyspnea, fatigue with being out of rhythm.    F/u in afib clinic, 07/26/19, he has returned to atrial flutter with variable block, v rates in the 60's, 5 days ago. I saw him late May, he was out of rhythm and cardioversion was planned but he self converted. There were several my chart messages that he stated that he was having fatigue on eliquis and wanted to stop it. He was told by PharmD that it was likely 2/2 the BB not eliquis. He has a h/o hep C and pharmacist at [facility] reassured him that eliquis was safe in that situation. He does not like being on meds. Very fatigued. I discussed ablation or change in antiarrythmic and he would prefer to discuss ablation. I feel even if he is  cardioverted, he may go back to afib/flutter soon, as he appears to be failing flecainide.   F/u in afib clinic, 11/13/20. He had an ablation one month ago and did maintain SR for almost 2 weeks. Since then he feels he is out of rhythm. He appears to be in an atrial flutter with variable block today. He continues on flecainide and metoprolol as well as eliquis 5 mg bid for a CHA2DS2VASc  score of 2. We discussed pursing cardioversion and he was in agreement.  F/u 10/26 in afib clinic. Had a successful cardioversion 11/20/20. He is in SR today. He remains on flecainide. No swallowing or groin issues since the procedure. Continues on eliquis. He feels better post cardioversion with return of SR.   Follow up in the AF clinic 12/18/20. Patient reports he was back in afib just a few days after his last office visit with variable heart rates. ECG today shows rate controlled atrial flutter.   Today, he denies symptoms of chest pain, shortness of breath, orthopnea, PND, lower extremity edema, dizziness, presyncope, syncope, or neurologic sequela. The patient is tolerating medications without difficulties and is otherwise without complaint today.   Past Medical History:  Diagnosis Date   ATRIAL FIBRILLATION 06/16/2008   Qualifier: Diagnosis of  By: [name], RMA, [name]     Atrial fibrillation (HCC)  Blood in stool    Cardiomyopathy    improved    DJD (degenerative joint disease) of lumbar spine    Gout    Hepatitis C    Hx of echocardiogram    a. Echo 12/13:  EF 60-65%, mod LAE, mild RAE, PASP 34   Intrinsic asthma, unspecified    Substance abuse (HCC)    Unspecified polyarthropathy or polyarthritis, multiple sites    Past Surgical History:  Procedure Laterality Date   ATRIAL FIBRILLATION ABLATION N/A 10/16/2020   Procedure: ATRIAL FIBRILLATION ABLATION;  Surgeon: Thompson Grayer, MD;  Location: [facility];  Service: Cardiovascular;  Laterality: N/A;   CARDIOVERSION N/A 11/20/2020    Procedure: CARDIOVERSION;  Surgeon: Sueanne Margarita, MD;  Location: [facility];  Service: Cardiovascular;  Laterality: N/A;   HERNIA REPAIR  1966    Current Outpatient Medications  Medication Sig Dispense Refill   albuterol (VENTOLIN HFA) 108 (90 Base) MCG/ACT inhaler Inhale 2 puffs into the lungs every 6 (six) hours as needed for wheezing or shortness of breath.     apixaban (ELIQUIS) 5 MG TABS tablet Take 1 tablet (5 mg total) by mouth 2 (two) times daily. 60 tablet 5   B Complex-C (B-COMPLEX WITH VITAMIN C) tablet Take 1 tablet by mouth daily.     flecainide (TAMBOCOR) 100 MG tablet Take 1 tablet (100 mg total) by mouth 2 (two) times daily. 180 tablet 3   Glycerin (CLEAR EYES ADV DRY & ITCHY RLF) 0.25 % SOLN Place 1-2 drops into both eyes 3 (three) times daily as needed.     Multiple Vitamin (MULTIVITAMIN WITH MINERALS) TABS tablet Take 1 tablet by mouth in the morning.     Potassium 99 MG TABS Take 99 mg by mouth in the morning.     vitamin C (ASCORBIC ACID) 500 MG tablet Take 500 mg by mouth daily.     allopurinol (ZYLOPRIM) 100 MG tablet Take 100 mg by mouth 2 (two) times daily.  (Patient not taking: Reported on 11/27/2020)     metoprolol tartrate (LOPRESSOR) 25 MG tablet Take 1 tablet (25 mg total) by mouth 2 (two) times daily. Take extra 1/2 tablet as needed 90 tablet 3   No current facility-administered medications for this encounter.    Allergies  Allergen Reactions   Indomethacin Other (See Comments)    Headaches and tired    Social History   Socioeconomic History   Marital status: Married    Spouse name: Not on file   Number of children: Not on file   Years of education: Not on file   Highest education level: Not on file  Occupational History   Not on file  Tobacco Use   Smoking status: Former    Types: Cigarettes    Quit date: 02/02/2014    Years since quitting: 6.8   Smokeless tobacco: Never   Tobacco comments:    started in 1999. smoked 1/4 ppd   Substance  and Sexual Activity   Alcohol use: No    Alcohol/week: 0.0 standard drinks    Comment: quit in 2006    Drug use: No    Comment: quit in 2006    Sexual activity: Not on file  Other Topics Concern   Not on file  Social History Narrative   Married; step children; disabled.    Social Determinants of Health   Financial Resource Strain: Not on file  Food Insecurity: Not on file  Transportation Needs: Not on file  Physical  Activity: Not on file  Stress: Not on file  Social Connections: Not on file  Intimate Partner Violence: Not on file    Family History  Problem Relation Age of Onset   Heart failure Other        CHF - father and mother. (mother also had severe RA)    ROS- All systems are reviewed and negative except as per the HPI above  Physical Exam: Vitals:   12/18/20 1139  BP: 136/90  Pulse: 78  Weight: 75.3 kg  Height: 6\' 2"  (1.88 m)    Wt Readings from Last 3 Encounters:  12/18/20 75.3 kg  11/27/20 74.8 kg  11/20/20 74.8 kg    Labs: Lab Results  Component Value Date   NA 141 11/13/2020   K 5.0 11/13/2020   CL 104 11/13/2020   CO2 29 11/13/2020   GLUCOSE 97 11/13/2020   BUN 19 11/13/2020   CREATININE 1.13 11/13/2020   CALCIUM 9.2 11/13/2020   MG 2.0 08/18/2007   Lab Results  Component Value Date   INR 2.3 10/01/2008   Lab Results  Component Value Date   CHOL  09/15/2008    170        ATP III CLASSIFICATION:  <200     mg/dL   Desirable  200-239  mg/dL   Borderline High  >=240    mg/dL   High          HDL 38 (L) 09/15/2008   LDLCALC (H) 09/15/2008    117        Total Cholesterol/HDL:CHD Risk Coronary Heart Disease Risk Table                     Men   Women  1/2 Average Risk   3.4   3.3  Average Risk       5.0   4.4  2 X Average Risk   9.6   7.1  3 X Average Risk  23.4   11.0        Use the calculated Patient Ratio above and the CHD Risk Table to determine the patient's CHD Risk.        ATP III CLASSIFICATION (LDL):  <100     mg/dL    Optimal  100-129  mg/dL   Near or Above                    Optimal  130-159  mg/dL   Borderline  160-189  mg/dL   High  >190     mg/dL   Very High   TRIG 77 09/15/2008    GEN- The patient is a well appearing male, alert and oriented x 3 today.   HEENT-head normocephalic, atraumatic, sclera clear, conjunctiva pink, hearing intact, trachea midline. Lungs- Clear to ausculation bilaterally, normal work of breathing Heart- irregular rate and rhythm, no murmurs, rubs or gallops  GI- soft, NT, ND, + BS Extremities- no clubbing, cyanosis, or edema MS- no significant deformity or atrophy Skin- no rash or lesion Psych- euthymic mood, full affect Neuro- strength and sensation are intact   EKG- atypical atrial flutter with variable block vs coarse afib Vent. rate 78 BPM PR interval * ms QRS duration 120 ms QT/QTcB 396/451 ms  Echo- FINDINGS -03/22/20 1. Left ventricular ejection fraction, by estimation, is 55 to 60%. The  left ventricle has normal function. The left ventricle has no regional  wall motion abnormalities. There is mild concentric left ventricular  hypertrophy of  the posterior segment.  Left ventricular diastolic parameters are indeterminate. The average left ventricular global longitudinal strain is -19.1 %. The global longitudinal strain is normal.   2. Right ventricular systolic function is normal. The right ventricular  size is normal.   3. Left atrial size was severely dilated. LA diam 4.70 cm but with volume of 185 ml  4. Right atrial size was severely dilated.   5. The mitral valve is normal in structure. Mild mitral valve  regurgitation. No evidence of mitral stenosis.   6. Tricuspid valve regurgitation is mild to moderate.   7. The aortic valve is normal in structure. Aortic valve regurgitation is  not visualized. No aortic stenosis is present.   8. The inferior vena cava is normal in size with greater than 50%    Assessment and Plan: 1. Persistent atrial  fibrillation/atrial flutter  S/p ablation  10/16/20 Patient back in rate controlled atrial flutter today.  We discussed rhythm control options including changing AAD vs repeat DCCV. We discussed dofetilide admission in detail today, information sheet given. Patient would like to try DCCV one more time before committing to dofetilide admission since he is still in the 3 month period after ablation. We did discuss his severely dilated LA may necessitate changing to dofetilide eventually.  Check bmet/cbc/mag today Continue metoprolol tartrate 25 mg BID Continue flecainide 100 mg bid for now  2. CHA2DS2VASc score of 2 Continue eliquis 5 mg bid   3. HTN Stable, no changes today.   Follow up with Roderic Palau one week post DCCV.    [facility] [address] [place], [place] [address] [phone]

## 2020-12-18 NOTE — Patient Instructions (Signed)
Cardioversion scheduled for Tuesday, November 29th  - Arrive at the [facility] and go to admitting at 8:00AM  - Do not eat or drink anything after midnight the night prior to your procedure.  - Take all your morning medication (except diabetic medications) with a sip of water prior to arrival.  - You will not be able to drive home after your procedure.  - Do NOT miss any doses of your blood thinner - if you should miss a dose please notify our office immediately.  - If you feel as if you go back into normal rhythm prior to scheduled cardioversion, please notify our office immediately. If your procedure is canceled in the cardioversion suite you will be charged a cancellation fee. Patients will be asked to: to mask in public and hand hygiene (no longer quarantine) in the 3 days prior to surgery, to report if any COVID-19-like illness or household contacts to COVID-19 to determine need for testing

## 2020-12-18 NOTE — H&P (View-Only) (Signed)
Primary Care Physician: Alferd Apa, MD Referring Physician:Dr. 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 His metoprolol dose was lowered earlier in the year for bradycardia in SR. He has had 4 episodes since the dose was lowered and this last episode has been persistent since last week. EKG shows atrial flutter with variable rate, with v rates in the 50's. Highest v rates have been in 80-90 bpm with exertion. He had a NICM when first dx with afib in 2008. LHC at that time showed normal coronary arteries and EF of 50%. Lat echo 03/2020, showed normal EF with severely dilated left atrium with a diameter of 4.70 and volume of 185 ml.   He denies any alcohol, tobacco, substance abuse,  excessive caffeine. He denies snoring unless he is on his back. No prior sleep study.   He has a CHA2DS2VASc score of 2(HTN, age). Dr. Jens Som started him on eliquis 5 mg bid several months ago but the pt states he did not start it as he was afraid of bleeding side effects. He states he has had 5 cardioversion's in the past, as well as some were TEE guided. He would like to have a CV asap. He voices exertional dyspnea, fatigue with being out of rhythm.    F/u in afib clinic, 07/26/19, he has returned to atrial flutter with variable block, v rates in the 60's, 5 days ago. I saw him late May, he was out of rhythm and cardioversion was planned but he self converted. There were several my chart messages that he stated that he was having fatigue on eliquis and wanted to stop it. He was told by PharmD that it was likely 2/2 the BB not eliquis. He has a h/o hep C and pharmacist at [facility] reassured him that eliquis was safe in that situation. He does not like being on meds. Very fatigued. I discussed ablation or change in antiarrythmic and he would prefer to discuss ablation. I feel even if he is  cardioverted, he may go back to afib/flutter soon, as he appears to be failing flecainide.   F/u in afib clinic, 11/13/20. He had an ablation one month ago and did maintain SR for almost 2 weeks. Since then he feels he is out of rhythm. He appears to be in an atrial flutter with variable block today. He continues on flecainide and metoprolol as well as eliquis 5 mg bid for a CHA2DS2VASc  score of 2. We discussed pursing cardioversion and he was in agreement.  F/u 10/26 in afib clinic. Had a successful cardioversion 11/20/20. He is in SR today. He remains on flecainide. No swallowing or groin issues since the procedure. Continues on eliquis. He feels better post cardioversion with return of SR.   Follow up in the AF clinic 12/18/20. Patient reports he was back in afib just a few days after his last office visit with variable heart rates. ECG today shows rate controlled atrial flutter.   Today, he denies symptoms of chest pain, shortness of breath, orthopnea, PND, lower extremity edema, dizziness, presyncope, syncope, or neurologic sequela. The patient is tolerating medications without difficulties and is otherwise without complaint today.   Past Medical History:  Diagnosis Date   ATRIAL FIBRILLATION 06/16/2008   Qualifier: Diagnosis of  By: [name], RMA, [name]     Atrial fibrillation (HCC)  Blood in stool    Cardiomyopathy    improved    DJD (degenerative joint disease) of lumbar spine    Gout    Hepatitis C    Hx of echocardiogram    a. Echo 12/13:  EF 60-65%, mod LAE, mild RAE, PASP 34   Intrinsic asthma, unspecified    Substance abuse (HCC)    Unspecified polyarthropathy or polyarthritis, multiple sites    Past Surgical History:  Procedure Laterality Date   ATRIAL FIBRILLATION ABLATION N/A 10/16/2020   Procedure: ATRIAL FIBRILLATION ABLATION;  Surgeon: Thompson Grayer, MD;  Location: [facility];  Service: Cardiovascular;  Laterality: N/A;   CARDIOVERSION N/A 11/20/2020    Procedure: CARDIOVERSION;  Surgeon: Sueanne Margarita, MD;  Location: [facility];  Service: Cardiovascular;  Laterality: N/A;   HERNIA REPAIR  1966    Current Outpatient Medications  Medication Sig Dispense Refill   albuterol (VENTOLIN HFA) 108 (90 Base) MCG/ACT inhaler Inhale 2 puffs into the lungs every 6 (six) hours as needed for wheezing or shortness of breath.     apixaban (ELIQUIS) 5 MG TABS tablet Take 1 tablet (5 mg total) by mouth 2 (two) times daily. 60 tablet 5   B Complex-C (B-COMPLEX WITH VITAMIN C) tablet Take 1 tablet by mouth daily.     flecainide (TAMBOCOR) 100 MG tablet Take 1 tablet (100 mg total) by mouth 2 (two) times daily. 180 tablet 3   Glycerin (CLEAR EYES ADV DRY & ITCHY RLF) 0.25 % SOLN Place 1-2 drops into both eyes 3 (three) times daily as needed.     Multiple Vitamin (MULTIVITAMIN WITH MINERALS) TABS tablet Take 1 tablet by mouth in the morning.     Potassium 99 MG TABS Take 99 mg by mouth in the morning.     vitamin C (ASCORBIC ACID) 500 MG tablet Take 500 mg by mouth daily.     allopurinol (ZYLOPRIM) 100 MG tablet Take 100 mg by mouth 2 (two) times daily.  (Patient not taking: Reported on 11/27/2020)     metoprolol tartrate (LOPRESSOR) 25 MG tablet Take 1 tablet (25 mg total) by mouth 2 (two) times daily. Take extra 1/2 tablet as needed 90 tablet 3   No current facility-administered medications for this encounter.    Allergies  Allergen Reactions   Indomethacin Other (See Comments)    Headaches and tired    Social History   Socioeconomic History   Marital status: Married    Spouse name: Not on file   Number of children: Not on file   Years of education: Not on file   Highest education level: Not on file  Occupational History   Not on file  Tobacco Use   Smoking status: Former    Types: Cigarettes    Quit date: 02/02/2014    Years since quitting: 6.8   Smokeless tobacco: Never   Tobacco comments:    started in 1999. smoked 1/4 ppd   Substance  and Sexual Activity   Alcohol use: No    Alcohol/week: 0.0 standard drinks    Comment: quit in 2006    Drug use: No    Comment: quit in 2006    Sexual activity: Not on file  Other Topics Concern   Not on file  Social History Narrative   Married; step children; disabled.    Social Determinants of Health   Financial Resource Strain: Not on file  Food Insecurity: Not on file  Transportation Needs: Not on file  Physical  Activity: Not on file  Stress: Not on file  Social Connections: Not on file  Intimate Partner Violence: Not on file    Family History  Problem Relation Age of Onset   Heart failure Other        CHF - father and mother. (mother also had severe RA)    ROS- All systems are reviewed and negative except as per the HPI above  Physical Exam: Vitals:   12/18/20 1139  BP: 136/90  Pulse: 78  Weight: 75.3 kg  Height: 6\' 2"  (1.88 m)    Wt Readings from Last 3 Encounters:  12/18/20 75.3 kg  11/27/20 74.8 kg  11/20/20 74.8 kg    Labs: Lab Results  Component Value Date   NA 141 11/13/2020   K 5.0 11/13/2020   CL 104 11/13/2020   CO2 29 11/13/2020   GLUCOSE 97 11/13/2020   BUN 19 11/13/2020   CREATININE 1.13 11/13/2020   CALCIUM 9.2 11/13/2020   MG 2.0 08/18/2007   Lab Results  Component Value Date   INR 2.3 10/01/2008   Lab Results  Component Value Date   CHOL  09/15/2008    170        ATP III CLASSIFICATION:  <200     mg/dL   Desirable  200-239  mg/dL   Borderline High  >=240    mg/dL   High          HDL 38 (L) 09/15/2008   LDLCALC (H) 09/15/2008    117        Total Cholesterol/HDL:CHD Risk Coronary Heart Disease Risk Table                     Men   Women  1/2 Average Risk   3.4   3.3  Average Risk       5.0   4.4  2 X Average Risk   9.6   7.1  3 X Average Risk  23.4   11.0        Use the calculated Patient Ratio above and the CHD Risk Table to determine the patient's CHD Risk.        ATP III CLASSIFICATION (LDL):  <100     mg/dL    Optimal  100-129  mg/dL   Near or Above                    Optimal  130-159  mg/dL   Borderline  160-189  mg/dL   High  >190     mg/dL   Very High   TRIG 77 09/15/2008    GEN- The patient is a well appearing male, alert and oriented x 3 today.   HEENT-head normocephalic, atraumatic, sclera clear, conjunctiva pink, hearing intact, trachea midline. Lungs- Clear to ausculation bilaterally, normal work of breathing Heart- irregular rate and rhythm, no murmurs, rubs or gallops  GI- soft, NT, ND, + BS Extremities- no clubbing, cyanosis, or edema MS- no significant deformity or atrophy Skin- no rash or lesion Psych- euthymic mood, full affect Neuro- strength and sensation are intact   EKG- atypical atrial flutter with variable block vs coarse afib Vent. rate 78 BPM PR interval * ms QRS duration 120 ms QT/QTcB 396/451 ms  Echo- FINDINGS -03/22/20 1. Left ventricular ejection fraction, by estimation, is 55 to 60%. The  left ventricle has normal function. The left ventricle has no regional  wall motion abnormalities. There is mild concentric left ventricular  hypertrophy of  the posterior segment.  Left ventricular diastolic parameters are indeterminate. The average left ventricular global longitudinal strain is -19.1 %. The global longitudinal strain is normal.   2. Right ventricular systolic function is normal. The right ventricular  size is normal.   3. Left atrial size was severely dilated. LA diam 4.70 cm but with volume of 185 ml  4. Right atrial size was severely dilated.   5. The mitral valve is normal in structure. Mild mitral valve  regurgitation. No evidence of mitral stenosis.   6. Tricuspid valve regurgitation is mild to moderate.   7. The aortic valve is normal in structure. Aortic valve regurgitation is  not visualized. No aortic stenosis is present.   8. The inferior vena cava is normal in size with greater than 50%    Assessment and Plan: 1. Persistent atrial  fibrillation/atrial flutter  S/p ablation  10/16/20 Patient back in rate controlled atrial flutter today.  We discussed rhythm control options including changing AAD vs repeat DCCV. We discussed dofetilide admission in detail today, information sheet given. Patient would like to try DCCV one more time before committing to dofetilide admission since he is still in the 3 month period after ablation. We did discuss his severely dilated LA may necessitate changing to dofetilide eventually.  Check bmet/cbc/mag today Continue metoprolol tartrate 25 mg BID Continue flecainide 100 mg bid for now  2. CHA2DS2VASc score of 2 Continue eliquis 5 mg bid   3. HTN Stable, no changes today.   Follow up with Roderic Palau one week post DCCV.    [facility] [address] [place], [place] [address] [phone]

## 2020-12-21 ENCOUNTER — Encounter (HOSPITAL_COMMUNITY): Payer: Medicare Other | Admitting: Nurse Practitioner

## 2020-12-30 ENCOUNTER — Encounter (HOSPITAL_COMMUNITY): Payer: Self-pay

## 2020-12-31 ENCOUNTER — Other Ambulatory Visit (HOSPITAL_COMMUNITY): Payer: Self-pay | Admitting: *Deleted

## 2020-12-31 ENCOUNTER — Ambulatory Visit (HOSPITAL_COMMUNITY): Payer: Medicare Other | Admitting: Anesthesiology

## 2020-12-31 ENCOUNTER — Encounter (HOSPITAL_COMMUNITY)
Admission: RE | Disposition: A | Discharge status: Home / Self Care | Payer: Self-pay | Source: Home / Self Care | Attending: Internal Medicine

## 2020-12-31 ENCOUNTER — Ambulatory Visit (HOSPITAL_COMMUNITY)
Admission: RE | Admit: 2020-12-31 | Discharge: 2020-12-31 | Disposition: A | Discharge status: Home / Self Care | Payer: Medicare Other | Attending: Internal Medicine | Admitting: Internal Medicine

## 2020-12-31 ENCOUNTER — Other Ambulatory Visit: Payer: Self-pay

## 2020-12-31 DIAGNOSIS — J45909 Unspecified asthma, uncomplicated: Secondary | ICD-10-CM | POA: Diagnosis not present

## 2020-12-31 DIAGNOSIS — I4819 Other persistent atrial fibrillation: Secondary | ICD-10-CM

## 2020-12-31 DIAGNOSIS — I071 Rheumatic tricuspid insufficiency: Secondary | ICD-10-CM | POA: Insufficient documentation

## 2020-12-31 DIAGNOSIS — Z87891 Personal history of nicotine dependence: Secondary | ICD-10-CM | POA: Diagnosis not present

## 2020-12-31 DIAGNOSIS — I4891 Unspecified atrial fibrillation: Secondary | ICD-10-CM | POA: Diagnosis present

## 2020-12-31 DIAGNOSIS — I4892 Unspecified atrial flutter: Secondary | ICD-10-CM | POA: Insufficient documentation

## 2020-12-31 DIAGNOSIS — Z7901 Long term (current) use of anticoagulants: Secondary | ICD-10-CM | POA: Insufficient documentation

## 2020-12-31 HISTORY — PX: CARDIOVERSION: SHX1299

## 2020-12-31 SURGERY — CARDIOVERSION
Anesthesia: General

## 2020-12-31 MED ORDER — LIDOCAINE 2% (20 MG/ML) 5 ML SYRINGE
INTRAMUSCULAR | Status: DC | PRN
  Administered 2020-12-31: 60 mg via INTRAVENOUS

## 2020-12-31 MED ORDER — PROPOFOL 10 MG/ML IV BOLUS
INTRAVENOUS | Status: DC | PRN
  Administered 2020-12-31: 100 mg via INTRAVENOUS

## 2020-12-31 MED ORDER — SODIUM CHLORIDE 0.9 % IV SOLN: INTRAVENOUS | Status: DC

## 2020-12-31 MED ORDER — METOPROLOL TARTRATE 25 MG PO TABS
25.0000 mg | ORAL_TABLET | Freq: Two times a day (BID) | ORAL | 3 refills | Status: DC
Start: 2020-12-31 — End: 2021-01-20

## 2020-12-31 NOTE — Discharge Instructions (Signed)

## 2020-12-31 NOTE — Interval H&P Note (Signed)
History and Physical Interval Note:  12/31/2020 9:06 AM  Scott Wagner  has presented today for surgery, with the diagnosis of AFIB.  The various methods of treatment have been discussed with the patient and family. After consideration of risks, benefits and other options for treatment, the patient has consented to  Procedure(s): CARDIOVERSION (N/A) as a surgical intervention.  The patient's history has been reviewed, patient examined, no change in status, stable for surgery.  I have reviewed the patient's chart and labs.  Questions were answered to the patient's satisfaction.     Chrystie Nose

## 2020-12-31 NOTE — Anesthesia Preprocedure Evaluation (Addendum)
Anesthesia Evaluation  Patient identified by MRN, date of birth, ID band Patient awake    Reviewed: Allergy & Precautions, NPO status , Patient's Chart, lab work & pertinent test results, reviewed documented beta blocker date and time   Airway Mallampati: I  TM Distance: >3 FB Neck ROM: Full    Dental  (+) Teeth Intact   Pulmonary asthma , former smoker,    breath sounds clear to auscultation       Cardiovascular + dysrhythmias Atrial Fibrillation  Rhythm:Irregular Rate:Normal  Echo:  1. Left ventricular ejection fraction, by estimation, is 55 to 60%. The  left ventricle has normal function. The left ventricle has no regional  wall motion abnormalities. There is mild concentric left ventricular  hypertrophy of the posterior segment.  Left ventricular diastolic parameters are indeterminate. The average left  ventricular global longitudinal strain is -19.1 %. The global longitudinal  strain is normal.  2. Right ventricular systolic function is normal. The right ventricular  size is normal.  3. Left atrial size was severely dilated.  4. Right atrial size was severely dilated.  5. The mitral valve is normal in structure. Mild mitral valve  regurgitation. No evidence of mitral stenosis.  6. Tricuspid valve regurgitation is mild to moderate.  7. The aortic valve is normal in structure. Aortic valve regurgitation is  not visualized. No aortic stenosis is present.  8. The inferior vena cava is normal in size with greater than 50%  respiratory variability, suggesting right atrial pressure of 3 mmHg.    Neuro/Psych negative neurological ROS  negative psych ROS   GI/Hepatic negative GI ROS, (+) Hepatitis -, C  Endo/Other  negative endocrine ROS  Renal/GU Renal disease  negative genitourinary   Musculoskeletal   Abdominal Normal abdominal exam  (+)   Peds  Hematology negative hematology ROS (+)   Anesthesia Other  Findings   Reproductive/Obstetrics                             Anesthesia Physical  Anesthesia Plan  ASA: 3  Anesthesia Plan: General   Post-op Pain Management:    Induction: Intravenous  PONV Risk Score and Plan: 0 and Propofol infusion  Airway Management Planned: Natural Airway and Simple Face Mask  Additional Equipment: None  Intra-op Plan:   Post-operative Plan:   Informed Consent: I have reviewed the patients History and Physical, chart, labs and discussed the procedure including the risks, benefits and alternatives for the proposed anesthesia with the patient or authorized representative who has indicated his/her understanding and acceptance.       Plan Discussed with: CRNA  Anesthesia Plan Comments:         Anesthesia Quick Evaluation

## 2020-12-31 NOTE — CV Procedure (Signed)
   CARDIOVERSION NOTE  Procedure: Electrical Cardioversion Indications:  Atrial Fibrillation  Procedure Details:  Consent: Risks of procedure as well as the alternatives and risks of each were explained to the (patient/caregiver).  Consent for procedure obtained.  Time Out: Verified patient identification, verified procedure, site/side was marked, verified correct patient position, special equipment/implants available, medications/allergies/relevent history reviewed, required imaging and test results available.  Performed  Patient placed on cardiac monitor, pulse oximetry, supplemental oxygen as necessary.  Sedation given:  propofol per anesthesia Pacer pads placed anterior and posterior chest.  Cardioverted 1 time(s).  Cardioverted at 200Jbiphasic.  Impression: Findings: Post procedure EKG shows: NSR Complications: None Patient did tolerate procedure well.  Plan: Successful DCCV with a single 200J biphasic shock to NSR.  Time Spent Directly with the Patient:  30 minutes   Chrystie Nose, MD, [facility], FACP  [place]  [facility]  Medical Director of the [facility] Lipid Disorders &  Cardiovascular Risk Reduction Clinic Diplomate of the American Board of Clinical Lipidology Attending Cardiologist  Direct Dial: [phone]  Fax: [phone]  Website:  [email or web address] 12/31/2020, 9:29 AM

## 2020-12-31 NOTE — Anesthesia Postprocedure Evaluation (Signed)
Anesthesia Post Note  Patient: Scott Wagner  Procedure(s) Performed: CARDIOVERSION     Patient location during evaluation: Endoscopy Anesthesia Type: General Level of consciousness: awake Pain management: pain level controlled Vital Signs Assessment: post-procedure vital signs reviewed and stable Respiratory status: spontaneous breathing Cardiovascular status: stable Postop Assessment: no apparent nausea or vomiting Anesthetic complications: no   No notable events documented.  Last Vitals:  Vitals:   12/31/20 0925 12/31/20 0935  BP: (!) 143/99 (!) 160/105  Pulse: 93 91  Resp: 19 (!) 23  Temp: (!) 36.3 C   SpO2: 99% 100%    Last Pain:  Vitals:   12/31/20 0935  TempSrc:   PainSc: 0-No pain                 [name]

## 2020-12-31 NOTE — Transfer of Care (Signed)
Immediate Anesthesia Transfer of Care Note  Patient: Scott Wagner  Procedure(s) Performed: CARDIOVERSION  Patient Location: PACU and Endoscopy Unit  Anesthesia Type:General  Level of Consciousness: drowsy  Airway & Oxygen Therapy: Patient Spontanous Breathing  Post-op Assessment: Report given to RN and Post -op Vital signs reviewed and stable  Post vital signs: Reviewed and stable  Last Vitals:  Vitals Value Taken Time  BP    Temp    Pulse    Resp    SpO2      Last Pain: There were no vitals filed for this visit.       Complications: No notable events documented.

## 2021-01-02 ENCOUNTER — Encounter (HOSPITAL_COMMUNITY): Payer: Self-pay | Admitting: Internal Medicine

## 2021-01-08 ENCOUNTER — Encounter (HOSPITAL_COMMUNITY): Payer: Self-pay | Admitting: Nurse Practitioner

## 2021-01-08 ENCOUNTER — Ambulatory Visit (HOSPITAL_COMMUNITY)
Admission: RE | Admit: 2021-01-08 | Discharge: 2021-01-08 | Disposition: A | Discharge status: Home / Self Care | Payer: Medicare Other | Source: Ambulatory Visit | Attending: Nurse Practitioner | Admitting: Nurse Practitioner

## 2021-01-08 ENCOUNTER — Other Ambulatory Visit: Payer: Self-pay

## 2021-01-08 VITALS — BP 150/96 | HR 73 | Ht 74.0 in | Wt 168.6 lb

## 2021-01-08 DIAGNOSIS — I4430 Unspecified atrioventricular block: Secondary | ICD-10-CM | POA: Diagnosis not present

## 2021-01-08 DIAGNOSIS — D6869 Other thrombophilia: Secondary | ICD-10-CM | POA: Diagnosis not present

## 2021-01-08 DIAGNOSIS — I4892 Unspecified atrial flutter: Secondary | ICD-10-CM | POA: Insufficient documentation

## 2021-01-08 DIAGNOSIS — I10 Essential (primary) hypertension: Secondary | ICD-10-CM | POA: Insufficient documentation

## 2021-01-08 DIAGNOSIS — Z7901 Long term (current) use of anticoagulants: Secondary | ICD-10-CM | POA: Diagnosis not present

## 2021-01-08 DIAGNOSIS — I4819 Other persistent atrial fibrillation: Secondary | ICD-10-CM

## 2021-01-08 NOTE — Progress Notes (Signed)
Primary Care Physician: Azell Der, MD Referring Physician:Dr. 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 His metoprolol dose was lowered earlier in the year for bradycardia in SR. He has had 4 episodes since the dose was lowered and this last episode has been persistent since last week. EKG shows atrial flutter with variable rate, with v rates in the 50's. Highest v rates have been in 80-90 bpm with exertion. He had a NICM when first dx with afib in 2008. LHC at that time showed normal coronary arteries and EF of 50%. Lat echo 03/2020, showed normal EF with severely dilated left atrium with a diameter of 4.70 and volume of 185 ml.   He denies any alcohol, tobacco, substance abuse,  excessive caffeine. He denies snoring unless he is on his back. No prior sleep study.   He has a CHA2DS2VASc score of 2(HTN, age). Dr. Stanford Breed started him on eliquis 5 mg bid several months ago but the pt states he did not start it as he was afraid of bleeding side effects. He states he has had 5 cardioversion's in the past, as well as some were TEE guided. He would like to have a CV asap. He voices exertional dyspnea, fatigue with being out of rhythm.    F/u in afib clinic, 07/26/19, he has returned to atrial flutter with variable block, v rates in the 60's, 5 days ago. I saw him late May, he was out of rhythm and cardioversion was planned but he self converted. There were several my chart messages that he stated that he was having fatigue on eliquis and wanted to stop it. He was told by PharmD that it was likely 2/2 the BB not eliquis. He has a h/o hep C and pharmacist at [facility] reassured him that eliquis was safe in that situation. He does not like being on meds. Very fatigued. I discussed ablation or change in antiarrythmic and he would prefer to discuss ablation. I feel even if he is  cardioverted, he may go back to afib/flutter soon, as he appears to be failing flecainide.   F/u in afib clinic, 11/13/20. He had an ablation one month ago and did maintain SR for almost 2 weeks. Since then he feels he is out of rhythm. He appears to be in an atrial flutter with variable block today. He continues on flecainide and metoprolol as well as eliquis 5 mg bid for a CHA2DS2VASc  score of 2. We discussed pursing cardioversion and he was in agreement.  F/u 10/26 in afib clinic. Had a successful cardioversion 11/20/20. He is in SR today. He remains on flecainide. No swallowing or groin issues since the procedure. Continues on eliquis. He feels better post cardioversion with return of SR.   Follow up in the AF clinic 12/18/20. Patient reports he was back in afib just a few days after his last office visit with variable heart rates. ECG today shows rate controlled atrial flutter.   F/u in afib clinic 01/08/21. He had cardioversion but had very ERAF. We discussed again need for stronger antiarrythmic, tikosyn. He has f/u with Dr. Rayann Heman 12/16 and he will further discuss with him. He did tell me that he had a bad choking experience with swallowing a pill when he was a child and he currently has to chew up his pills. He was told  that he would not be able to do that with Tikosyn and would have to swallow the capsule  whole.  He remains in rate controlled atrial flutter with controlled rates. He is symptomatic with fatigue being out of rhythm.   Today, he denies symptoms of chest pain, shortness of breath, orthopnea, PND, lower extremity edema, dizziness, presyncope, syncope, or neurologic sequela. The patient is tolerating medications without difficulties and is otherwise without complaint today.   Past Medical History:  Diagnosis Date   ATRIAL FIBRILLATION 06/16/2008   Qualifier: Diagnosis of  By: Mare Ferrari, RMA, [name]     Atrial fibrillation ([place])    Blood in stool    Cardiomyopathy    improved     DJD (degenerative joint disease) of lumbar spine    Gout    Hepatitis C    Hx of echocardiogram    a. Echo 12/13:  EF 60-65%, mod LAE, mild RAE, PASP 34   Intrinsic asthma, unspecified    Substance abuse ([place])    Unspecified polyarthropathy or polyarthritis, multiple sites    Past Surgical History:  Procedure Laterality Date   ATRIAL FIBRILLATION ABLATION N/A 10/16/2020   Procedure: ATRIAL FIBRILLATION ABLATION;  Surgeon: Thompson Grayer, MD;  Location: [facility];  Service: Cardiovascular;  Laterality: N/A;   CARDIOVERSION N/A 11/20/2020   Procedure: CARDIOVERSION;  Surgeon: Sueanne Margarita, MD;  Location: [facility] ENDOSCOPY;  Service: Cardiovascular;  Laterality: N/A;   CARDIOVERSION N/A 12/31/2020   Procedure: CARDIOVERSION;  Surgeon: Pixie Casino, MD;  Location: [facility] ENDOSCOPY;  Service: Cardiovascular;  Laterality: N/A;   HERNIA REPAIR  1966    Current Outpatient Medications  Medication Sig Dispense Refill   albuterol (VENTOLIN HFA) 108 (90 Base) MCG/ACT inhaler Inhale 2 puffs into the lungs every 6 (six) hours as needed for wheezing or shortness of breath.     apixaban (ELIQUIS) 5 MG TABS tablet Take 1 tablet (5 mg total) by mouth 2 (two) times daily. 60 tablet 5   B Complex-C (B-COMPLEX WITH VITAMIN C) tablet Take 1 tablet by mouth daily.     flecainide (TAMBOCOR) 100 MG tablet Take 1 tablet (100 mg total) by mouth 2 (two) times daily. 180 tablet 3   Glycerin (CLEAR EYES ADV DRY & ITCHY RLF) 0.25 % SOLN Place 1-2 drops into both eyes 3 (three) times daily as needed.     metoprolol tartrate (LOPRESSOR) 25 MG tablet Take 1 tablet (25 mg total) by mouth 2 (two) times daily. Take extra 1/2 tablet as needed 200 tablet 3   Multiple Vitamin (MULTIVITAMIN WITH MINERALS) TABS tablet Take 1 tablet by mouth in the morning.     Potassium 99 MG TABS Take 99 mg by mouth in the morning.     vitamin C (ASCORBIC ACID) 500 MG tablet Take 500 mg by mouth daily.     No current  facility-administered medications for this encounter.    Allergies  Allergen Reactions   Indomethacin Other (See Comments)    Headaches and tired    Social History   Socioeconomic History   Marital status: Married    Spouse name: Not on file   Number of children: Not on file   Years of education: Not on file   Highest education level: Not on file  Occupational History   Not on file  Tobacco Use   Smoking status: Former    Types: Cigarettes    Quit date: 02/02/2014    Years since quitting: 6.9   Smokeless  tobacco: Never   Tobacco comments:    started in 1999. smoked 1/4 ppd   Substance and Sexual Activity   Alcohol use: No    Alcohol/week: 0.0 standard drinks    Comment: quit in 2006    Drug use: No    Comment: quit in 2006    Sexual activity: Not on file  Other Topics Concern   Not on file  Social History Narrative   Married; step children; disabled.    Social Determinants of Health   Financial Resource Strain: Not on file  Food Insecurity: Not on file  Transportation Needs: Not on file  Physical Activity: Not on file  Stress: Not on file  Social Connections: Not on file  Intimate Partner Violence: Not on file    Family History  Problem Relation Age of Onset   Heart failure Other        CHF - father and mother. (mother also had severe RA)    ROS- All systems are reviewed and negative except as per the HPI above  Physical Exam: Vitals:   01/08/21 1103  Weight: 76.5 kg  Height: 6\' 2"  (1.88 m)    Wt Readings from Last 3 Encounters:  01/08/21 76.5 kg  12/18/20 75.3 kg  11/27/20 74.8 kg    Labs: Lab Results  Component Value Date   NA 142 12/18/2020   K 4.6 12/18/2020   CL 106 12/18/2020   CO2 27 12/18/2020   GLUCOSE 97 12/18/2020   BUN 20 12/18/2020   CREATININE 1.24 12/18/2020   CALCIUM 9.0 12/18/2020   MG 2.0 12/18/2020   Lab Results  Component Value Date   INR 2.3 10/01/2008   Lab Results  Component Value Date   CHOL  09/15/2008     170        ATP III CLASSIFICATION:  <200     mg/dL   Desirable  200-239  mg/dL   Borderline High  >=240    mg/dL   High          HDL 38 (L) 09/15/2008   LDLCALC (H) 09/15/2008    117        Total Cholesterol/HDL:CHD Risk Coronary Heart Disease Risk Table                     Men   Women  1/2 Average Risk   3.4   3.3  Average Risk       5.0   4.4  2 X Average Risk   9.6   7.1  3 X Average Risk  23.4   11.0        Use the calculated Patient Ratio above and the CHD Risk Table to determine the patient's CHD Risk.        ATP III CLASSIFICATION (LDL):  <100     mg/dL   Optimal  100-129  mg/dL   Near or Above                    Optimal  130-159  mg/dL   Borderline  160-189  mg/dL   High  >190     mg/dL   Very High   TRIG 77 09/15/2008    GEN- The patient is a well appearing male, alert and oriented x 3 today.   HEENT-head normocephalic, atraumatic, sclera clear, conjunctiva pink, hearing intact, trachea midline. Lungs- Clear to ausculation bilaterally, normal work of breathing Heart- irregular rate and rhythm, no murmurs, rubs or gallops  GI- soft, NT, ND, + BS Extremities- no clubbing, cyanosis, or edema MS- no significant deformity or atrophy Skin- no rash or lesion Psych- euthymic mood, full affect Neuro- strength and sensation are intact   EKG- Vent. rate 73 BPM PR interval * ms QRS duration 120 ms QT/QTcB 410/451 ms P-R-T axes * 30 61 Atrial flutter with variable A-V block Left ventricular hypertrophy with QRS widening ( Sokolow-Lyon , Cornell product ) Abnormal ECG  Echo- FINDINGS -03/22/20 1. Left ventricular ejection fraction, by estimation, is 55 to 60%. The  left ventricle has normal function. The left ventricle has no regional  wall motion abnormalities. There is mild concentric left ventricular  hypertrophy of the posterior segment.  Left ventricular diastolic parameters are indeterminate. The average left ventricular global longitudinal strain is -19.1 %.  The global longitudinal strain is normal.   2. Right ventricular systolic function is normal. The right ventricular  size is normal.   3. Left atrial size was severely dilated. LA diam 4.70 cm but with volume of 185 ml  4. Right atrial size was severely dilated.   5. The mitral valve is normal in structure. Mild mitral valve  regurgitation. No evidence of mitral stenosis.   6. Tricuspid valve regurgitation is mild to moderate.   7. The aortic valve is normal in structure. Aortic valve regurgitation is  not visualized. No aortic stenosis is present.   8. The inferior vena cava is normal in size with greater than 50%    Assessment and Plan: 1. Persistent atrial fibrillation/atrial flutter  S/p ablation  10/16/20 Patient has had issue with recurrent rate controlled atrial flutter since ablation  We discussed rhythm control options including changing AAD vs repeat DCCV. We discussed dofetilide admission in detail today, information sheet given. Patient wanted like to try DCCV one more time before committing to dofetilide admission since he is still in the 3 month period after ablation. We did discuss his severely dilated LA may necessitate changing to dofetilide eventually. He is not a good candidate for amiodarone due to relative  young age and chronic liver issues  He had the repeat cardioversion 01/08/21 and did shock out but had very early return of atrial flutter within 24 hours  We again discussed dofetilide and pt wants to  speak with Dr. Johney Frame with his upcoming appointment 12/16 He will have to be able to swallow the pill intact and not open capsule or chew it up He will practice swallowing pills on applesauce His other pills metoprolol/ Flecainide /eliquis are not meant to be chewed as well   Continue metoprolol tartrate 25 mg BID Continue flecainide 100 mg bid for now  2. CHA2DS2VASc score of 2 Continue eliquis 5 mg bid   3. HTN Stable, no changes today.    Informed pt to let  us know his plans after meeting with Dr. Johney Frame  and if Tikosyn is decided upon, can get that scheduled for pt  He will require a 3 day washout of flecainide prior to admit     Lupita Leash C. Matthew Folks [facility] [address] [place], [place] [address] [phone]

## 2021-01-20 ENCOUNTER — Ambulatory Visit: Payer: Medicare Other | Admitting: Internal Medicine

## 2021-01-20 ENCOUNTER — Other Ambulatory Visit: Payer: Self-pay

## 2021-01-20 ENCOUNTER — Encounter: Payer: Self-pay | Admitting: Internal Medicine

## 2021-01-20 VITALS — BP 136/86 | HR 91 | Ht 73.0 in | Wt 171.0 lb

## 2021-01-20 DIAGNOSIS — I4819 Other persistent atrial fibrillation: Secondary | ICD-10-CM | POA: Diagnosis not present

## 2021-01-20 DIAGNOSIS — I10 Essential (primary) hypertension: Secondary | ICD-10-CM | POA: Diagnosis not present

## 2021-01-20 MED ORDER — METOPROLOL TARTRATE 50 MG PO TABS: 50.0000 mg | ORAL_TABLET | Freq: Two times a day (BID) | ORAL | 3 refills | Status: DC

## 2021-01-20 NOTE — Patient Instructions (Addendum)
Medication Instructions:  Stop Flecainide  Increase Metoprolol Tartrate to 50 mg two times a day Your physician recommends that you continue on your current medications as directed. Please refer to the Current Medication list given to you today. *If you need a refill on your cardiac medications before your next appointment, please call your pharmacy*  Lab Work: None. If you have labs (blood work) drawn today and your tests are completely normal, you will receive your results only by: MyChart Message (if you have MyChart) OR A paper copy in the mail If you have any lab test that is abnormal or we need to change your treatment, we will call you to review the results.  Testing/Procedures: None.  Follow-Up: At [facility], you and your health needs are our priority.  As part of our continuing mission to provide you with exceptional heart care, we have created designated Provider Care Teams.  These Care Teams include your primary Cardiologist (physician) and Advanced Practice Providers (APPs -  Physician Assistants and Nurse Practitioners) who all work together to provide you with the care you need, when you need it.  Your physician wants you to follow-up in: Afib Clinic will contact you regarding Tikosyn admission/setup   We recommend signing up for the patient portal called "MyChart".  Sign up information is provided on this After Visit Summary.  MyChart is used to connect with patients for Virtual Visits (Telemedicine).  Patients are able to view lab/test results, encounter notes, upcoming appointments, etc.  Non-urgent messages can be sent to your provider as well.   To learn more about what you can do with MyChart, go to [email or web address].    Any Other Special Instructions Will Be Listed Below (If Applicable).  Tikosyn (Dofetilide) Hospital Admission  Prior to day of admission: Check with drug insurance company for cost of drug to ensure affordability --- Dofetilide 500 mcg  twice a day.  GoodRx is an option if insurance copay is unaffordable.  All patients are tested for COVID-19 prior to admission.  No Benadryl is allowed 3 days prior to admission.  Please ensure no missed doses of your anticoagulation (blood thinner) for 3 weeks prior to admission. If a dose is missed please notify our office immediately.  A pharmacist will review all your medications for potential interactions with Tikosyn. If any medication changes are needed prior to admission we will be in touch with you.  If any new medications are started AFTER your admission date is set with Radio [facility]. Please notify our office immediately so your medication list can be updated and reviewed by our pharmacist again. On day of admission: Tikosyn initiation requires a 3 night/4 day hospital stay with constant telemetry monitoring. You will have an EKG after each dose of Tikosyn as well as daily lab draws.  If the drug does not convert you to normal rhythm a cardioversion after the 4th dose of Tikosyn.  Afib Clinic office visit on the morning of admission is needed for preliminary labs/ekg.  Time of admission is dependent on bed availability in the hospital. In some instances, you will be sent home until bed is available. Rarely admission can be delayed to the following day if hospital census prevents available beds.  You may bring personal belongings/clothing with you to the hospital. Please leave your suitcase in the car until you arrive in admissions.  Questions please call our office at [phone]

## 2021-01-20 NOTE — Progress Notes (Signed)
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 male who presents today for routine electrophysiology followup.  Since his recent afib ablation, the patient reports doing reasonably well.  he denies procedure related complications.  Unfortunately, he continues to have afib.  Today, he denies symptoms of palpitations, chest pain, shortness of breath,  lower extremity edema, dizziness, presyncope, or syncope.  The patient is otherwise without complaint today.   Past Medical History:  Diagnosis Date   ATRIAL FIBRILLATION 06/16/2008   Qualifier: Diagnosis of  By: [name], RMA, [name]     Atrial fibrillation ([facility])    Blood in stool    Cardiomyopathy    improved    DJD (degenerative joint disease) of lumbar spine    Gout    Hepatitis C    Hx of echocardiogram    a. Echo 12/13:  EF 60-65%, mod LAE, mild RAE, PASP 34   Intrinsic asthma, unspecified    Substance abuse ([facility])    Unspecified polyarthropathy or polyarthritis, multiple sites    Past Surgical History:  Procedure Laterality Date   ATRIAL FIBRILLATION ABLATION N/A 10/16/2020   Procedure: ATRIAL FIBRILLATION ABLATION;  Surgeon: [name], MD;  Location: [facility];  Service: Cardiovascular;  Laterality: N/A;   CARDIOVERSION N/A 11/20/2020   Procedure: CARDIOVERSION;  Surgeon: Quintella Reichert, MD;  Location: [facility] ENDOS[facility];  Service: Cardiovascular;  Laterality: N/A;   CARDIOVERSION N/A 12/31/2020   Procedure: CARDIOVERSION;  Surgeon: Chrystie Nose, MD;  Location: [facility] ENDOSCOPY;  Service: Cardiovascular;  Laterality: N/A;   HERNIA REPAIR  1966    ROS- all systems are personally reviewed and negatives except as per HPI above  Current Outpatient Medications  Medication Sig Dispense Refill   albuterol (VENTOLIN HFA) 108 (90 Base) MCG/ACT inhaler Inhale 2 puffs into the lungs every 6 (six) hours as needed for wheezing or shortness of breath.     apixaban (ELIQUIS) 5 MG TABS  tablet Take 1 tablet (5 mg total) by mouth 2 (two) times daily. 60 tablet 5   B Complex-C (B-COMPLEX WITH VITAMIN C) tablet Take 1 tablet by mouth daily.     flecainide (TAMBOCOR) 100 MG tablet Take 1 tablet (100 mg total) by mouth 2 (two) times daily. 180 tablet 3   Glycerin (CLEAR EYES ADV DRY & ITCHY RLF) 0.25 % SOLN Place 1-2 drops into both eyes 3 (three) times daily as needed.     metoprolol tartrate (LOPRESSOR) 25 MG tablet Take 1 tablet (25 mg total) by mouth 2 (two) times daily. Take extra 1/2 tablet as needed 200 tablet 3   Multiple Vitamin (MULTIVITAMIN WITH MINERALS) TABS tablet Take 1 tablet by mouth in the morning.     Potassium 99 MG TABS Take 99 mg by mouth in the morning.     vitamin C (ASCORBIC ACID) 500 MG tablet Take 500 mg by mouth daily.     No current facility-administered medications for this visit.    Physical Exam: Vitals:   01/20/21 1536  BP: 136/86  Pulse: 91  SpO2: 95%  Weight: 171 lb (77.6 kg)  Height: 6\' 1"  (1.854 m)    GEN- The patient is well appearing, alert and oriented x 3 today.   Head- normocephalic, atraumatic Eyes-  Sclera clear, conjunctiva pink Ears- hearing intact Oropharynx- clear Lungs- Clear to ausculation bilaterally, normal work of breathing Heart- iRRR GI- soft, NT, ND, + BS Extremities- no clubbing, cyanosis, or edema Skin-  bronze skin color  EKG tracing ordered today is personally reviewed and shows afib, V rates 90s  Assessment and Plan:  1. Persistent atrial fibrillation Continues to have afib despite ablation.  Not surprising given severe biatrial enlargement. chads2vasc score is 2.  He is on eliquis We discussed options of rate control, tikosyn, amiodarone, or convergent procedure at length.  Risks and benefits to each approach were discussed.  At this time, he is very clear that he would ike to pursue tikosyn initiation. Stop flecainide today. Labs reviewed We will plan tikosyn admission at the next available  time.  2. HTN Stable No change required today  [name] MD, [facility] 01/20/2021 3:37 PM

## 2021-01-23 ENCOUNTER — Telehealth: Payer: Self-pay | Admitting: Pharmacist

## 2021-01-23 NOTE — Telephone Encounter (Signed)
Medication list reviewed in anticipation of upcoming Tikosyn initiation. Patient is taking albuterol which is QTc prolonging but pt ok to continue taking. He is not on any contraindicated medications.   Patient is anticoagulated on Eliquis 5mg  BID on the appropriate dose. Please ensure that patient has not missed any anticoagulation doses in the 3 weeks prior to Tikosyn initiation.   Patient will need to be counseled to avoid use of Benadryl while on Tikosyn and in the 2-3 days prior to Tikosyn initiation.

## 2021-01-29 NOTE — Progress Notes (Signed)
HPI:FU nonischemic cardiomyopathy and atrial fibrillation. Cardiomyopathy was likely tachycardia mediated. LHC 3/08: Normal coronary arteries, EF 50%. Also with history of substance abuse. Most recent echocardiogram February 2022 showed normal LV function, mild left ventricular hypertrophy, severe biatrial enlargement, mild mitral regurgitation, mild to moderate tricuspid regurgitation.  Abdominal ultrasound April 2022 showed no evidence of aneurysm.  Had atrial fibrillation ablation September 2022.  He has had recurrent atrial fibrillation following his ablation and plan is to initiate Tikosyn.  Since last seen, he has some dyspnea on exertion that he attributes to atrial fibrillation.  No orthopnea, PND, pedal edema, chest pain or syncope.  He does have palpitations.  Current Outpatient Medications  Medication Sig Dispense Refill   albuterol (VENTOLIN HFA) 108 (90 Base) MCG/ACT inhaler Inhale 2 puffs into the lungs every 6 (six) hours as needed for wheezing or shortness of breath.     apixaban (ELIQUIS) 5 MG TABS tablet Take 1 tablet (5 mg total) by mouth 2 (two) times daily. 60 tablet 5   B Complex-C (B-COMPLEX WITH VITAMIN C) tablet Take 1 tablet by mouth daily.     Glycerin (CLEAR EYES ADV DRY & ITCHY RLF) 0.25 % SOLN Place 1-2 drops into both eyes 3 (three) times daily as needed.     metoprolol tartrate (LOPRESSOR) 50 MG tablet Take 1 tablet (50 mg total) by mouth 2 (two) times daily. 180 tablet 3   Multiple Vitamin (MULTIVITAMIN WITH MINERALS) TABS tablet Take 1 tablet by mouth in the morning.     Potassium 99 MG TABS Take 99 mg by mouth in the morning.     vitamin C (ASCORBIC ACID) 500 MG tablet Take 500 mg by mouth daily.     No current facility-administered medications for this visit.     Past Medical History:  Diagnosis Date   ATRIAL FIBRILLATION 06/16/2008   Qualifier: Diagnosis of  By: Mare Ferrari, RMA, [name]     Atrial fibrillation ([place])    Blood in stool    Cardiomyopathy     improved    DJD (degenerative joint disease) of lumbar spine    Gout    Hepatitis C    Hx of echocardiogram    a. Echo 12/13:  EF 60-65%, mod LAE, mild RAE, PASP 34   Intrinsic asthma, unspecified    Substance abuse ([place])    Unspecified polyarthropathy or polyarthritis, multiple sites     Past Surgical History:  Procedure Laterality Date   ATRIAL FIBRILLATION ABLATION N/A 10/16/2020   Procedure: ATRIAL FIBRILLATION ABLATION;  Surgeon: Thompson Grayer, MD;  Location: [place] [facility] LAB;  Service: Cardiovascular;  Laterality: N/A;   CARDIOVERSION N/A 11/20/2020   Procedure: CARDIOVERSION;  Surgeon: Sueanne Margarita, MD;  Location: [facility] ENDOSCOPY;  Service: Cardiovascular;  Laterality: N/A;   CARDIOVERSION N/A 12/31/2020   Procedure: CARDIOVERSION;  Surgeon: Pixie Casino, MD;  Location: [facility] ENDOSCOPY;  Service: Cardiovascular;  Laterality: N/A;   HERNIA REPAIR  1966    Social History   Socioeconomic History   Marital status: Married    Spouse name: Not on file   Number of children: Not on file   Years of education: Not on file   Highest education level: Not on file  Occupational History   Not on file  Tobacco Use   Smoking status: Former    Types: Cigarettes    Quit date: 02/02/2014    Years since quitting: 7.0   Smokeless tobacco: Never   Tobacco comments:  started in 1999. smoked 1/4 ppd   Substance and Sexual Activity   Alcohol use: No    Alcohol/week: 0.0 standard drinks    Comment: quit in 2006    Drug use: No    Comment: quit in 2006    Sexual activity: Not on file  Other Topics Concern   Not on file  Social History Narrative   Married; step children; disabled.    Social Determinants of Health   Financial Resource Strain: Not on file  Food Insecurity: Not on file  Transportation Needs: Not on file  Physical Activity: Not on file  Stress: Not on file  Social Connections: Not on file  Intimate Partner Violence: Not on file    Family History  Problem  Relation Age of Onset   Heart failure Other        CHF - father and mother. (mother also had severe RA)    ROS: no fevers or chills, productive cough, hemoptysis, dysphasia, odynophagia, melena, hematochezia, dysuria, hematuria, rash, seizure activity, orthopnea, PND, pedal edema, claudication. Remaining systems are negative.  Physical Exam: Well-developed well-nourished in no acute distress.  Skin is warm and dry.  HEENT is normal.  Neck is supple.  Chest is clear to auscultation with normal expansion.  Cardiovascular exam is irregular Abdominal exam nontender or distended. No masses palpated. Extremities show no edema. neuro grossly intact   A/P  1 persistent atrial fibrillation-patient remains in atrial fibrillation on examination and is symptomatic.  He has been seen by Dr. Johney Frame and plan is to initiate Tikosyn later this month.  Follow-up in atrial fibrillation clinic.  Continue apixaban.   2 hypertension-blood pressure elevated; add amlodipine 5 mg daily and follow.  He did not tolerate losartan previously.  3 history of substance abuse-has not used recently by his report.  Olga Millers, MD

## 2021-01-31 ENCOUNTER — Encounter (HOSPITAL_COMMUNITY): Payer: Self-pay

## 2021-02-10 ENCOUNTER — Encounter: Payer: Self-pay | Admitting: Cardiology

## 2021-02-10 ENCOUNTER — Ambulatory Visit (INDEPENDENT_AMBULATORY_CARE_PROVIDER_SITE_OTHER): Payer: Medicare Other | Admitting: Cardiology

## 2021-02-10 ENCOUNTER — Other Ambulatory Visit: Payer: Self-pay

## 2021-02-10 VITALS — BP 146/105 | HR 100 | Ht 73.0 in | Wt 167.8 lb

## 2021-02-10 DIAGNOSIS — I4819 Other persistent atrial fibrillation: Secondary | ICD-10-CM

## 2021-02-10 DIAGNOSIS — I10 Essential (primary) hypertension: Secondary | ICD-10-CM | POA: Diagnosis not present

## 2021-02-10 MED ORDER — AMLODIPINE BESYLATE 5 MG PO TABS: 5.0000 mg | ORAL_TABLET | Freq: Every day | ORAL | 3 refills | Status: DC

## 2021-02-10 NOTE — Patient Instructions (Signed)
Medication Instructions:   START AMLODIPINE 5 MG ONCE DAILY  *If you need a refill on your cardiac medications before your next appointment, please call your pharmacy*   Follow-Up: At [facility], you and your health needs are our priority.  As part of our continuing mission to provide you with exceptional heart care, we have created designated Provider Care Teams.  These Care Teams include your primary Cardiologist (physician) and Advanced Practice Providers (APPs -  Physician Assistants and Nurse Practitioners) who all work together to provide you with the care you need, when you need it.  We recommend signing up for the patient portal called "MyChart".  Sign up information is provided on this After Visit Summary.  MyChart is used to connect with patients for Virtual Visits (Telemedicine).  Patients are able to view lab/test results, encounter notes, upcoming appointments, etc.  Non-urgent messages can be sent to your provider as well.   To learn more about what you can do with MyChart, go to [email or web address].    Your next appointment:   6 month(s)  The format for your next appointment:   In Person  Provider:   Olga Millers MD

## 2021-02-21 ENCOUNTER — Other Ambulatory Visit: Payer: Self-pay | Admitting: Internal Medicine

## 2021-02-21 LAB — SARS CORONAVIRUS 2 (TAT 6-24 HRS): SARS Coronavirus 2: NEGATIVE

## 2021-02-25 ENCOUNTER — Other Ambulatory Visit: Payer: Self-pay

## 2021-02-25 ENCOUNTER — Inpatient Hospital Stay (HOSPITAL_COMMUNITY)
Admission: AD | Admit: 2021-02-25 | Discharge: 2021-02-28 | DRG: 310 | Disposition: A | Discharge status: Home / Self Care | Payer: Medicare Other | Source: Ambulatory Visit | Attending: Cardiology | Admitting: Cardiology

## 2021-02-25 ENCOUNTER — Ambulatory Visit (HOSPITAL_COMMUNITY)
Admission: RE | Admit: 2021-02-25 | Discharge: 2021-02-25 | Disposition: A | Discharge status: Home / Self Care | Payer: Medicare Other | Source: Ambulatory Visit | Attending: Nurse Practitioner | Admitting: Nurse Practitioner

## 2021-02-25 ENCOUNTER — Encounter (HOSPITAL_COMMUNITY): Payer: Self-pay | Admitting: Nurse Practitioner

## 2021-02-25 ENCOUNTER — Other Ambulatory Visit (HOSPITAL_COMMUNITY): Payer: Self-pay

## 2021-02-25 VITALS — BP 140/98 | HR 84 | Ht 73.0 in | Wt 165.8 lb

## 2021-02-25 DIAGNOSIS — Z87891 Personal history of nicotine dependence: Secondary | ICD-10-CM | POA: Diagnosis not present

## 2021-02-25 DIAGNOSIS — I482 Chronic atrial fibrillation, unspecified: Secondary | ICD-10-CM

## 2021-02-25 DIAGNOSIS — Z79899 Other long term (current) drug therapy: Secondary | ICD-10-CM

## 2021-02-25 DIAGNOSIS — D6869 Other thrombophilia: Secondary | ICD-10-CM

## 2021-02-25 DIAGNOSIS — G8929 Other chronic pain: Secondary | ICD-10-CM | POA: Diagnosis present

## 2021-02-25 DIAGNOSIS — I10 Essential (primary) hypertension: Secondary | ICD-10-CM | POA: Diagnosis present

## 2021-02-25 DIAGNOSIS — Z8249 Family history of ischemic heart disease and other diseases of the circulatory system: Secondary | ICD-10-CM

## 2021-02-25 DIAGNOSIS — I4892 Unspecified atrial flutter: Secondary | ICD-10-CM | POA: Diagnosis present

## 2021-02-25 DIAGNOSIS — I4819 Other persistent atrial fibrillation: Principal | ICD-10-CM | POA: Diagnosis present

## 2021-02-25 DIAGNOSIS — M109 Gout, unspecified: Secondary | ICD-10-CM | POA: Diagnosis present

## 2021-02-25 DIAGNOSIS — Z7901 Long term (current) use of anticoagulants: Secondary | ICD-10-CM | POA: Diagnosis not present

## 2021-02-25 DIAGNOSIS — I429 Cardiomyopathy, unspecified: Secondary | ICD-10-CM | POA: Diagnosis present

## 2021-02-25 LAB — BASIC METABOLIC PANEL
Anion gap: 6 (ref 5–15)
BUN: 20 mg/dL (ref 8–23)
CO2: 30 mmol/L (ref 22–32)
Calcium: 9.4 mg/dL (ref 8.9–10.3)
Chloride: 103 mmol/L (ref 98–111)
Creatinine, Ser: 1.13 mg/dL (ref 0.61–1.24)
GFR, Estimated: >60 mL/min (ref >60)
Glucose, Bld: 116 mg/dL — ABNORMAL HIGH (ref 70–99)
Potassium: 5.6 mmol/L — ABNORMAL HIGH (ref 3.5–5.1)
Sodium: 139 mmol/L (ref 135–145)

## 2021-02-25 LAB — MAGNESIUM: Magnesium: 2 mg/dL (ref 1.7–2.4)

## 2021-02-25 MED ORDER — ACETAMINOPHEN 325 MG PO TABS
650.0000 mg | ORAL_TABLET | Freq: Four times a day (QID) | ORAL | Status: DC | PRN
  Administered 2021-02-25 – 2021-02-27 (×2): 650 mg via ORAL
  Filled 2021-02-25 (×2): qty 2

## 2021-02-25 MED ORDER — SODIUM ZIRCONIUM CYCLOSILICATE 5 G PO PACK
5.0000 g | PACK | Freq: Once | ORAL | Status: AC
  Administered 2021-02-25: 16:00:00 5 g via ORAL
  Filled 2021-02-25: qty 1

## 2021-02-25 MED ORDER — METOPROLOL TARTRATE 50 MG PO TABS
50.0000 mg | ORAL_TABLET | Freq: Two times a day (BID) | ORAL | Status: DC
  Administered 2021-02-25 – 2021-02-27 (×4): 50 mg via ORAL
  Filled 2021-02-25 (×4): qty 1

## 2021-02-25 MED ORDER — SODIUM CHLORIDE 0.9% FLUSH
3.0000 mL | Freq: Two times a day (BID) | INTRAVENOUS | Status: DC
  Administered 2021-02-25 – 2021-02-28 (×7): 3 mL via INTRAVENOUS

## 2021-02-25 MED ORDER — KETOROLAC TROMETHAMINE 10 MG PO TABS
10.0000 mg | ORAL_TABLET | Freq: Once | ORAL | Status: DC
  Filled 2021-02-25: qty 1

## 2021-02-25 MED ORDER — MAGNESIUM SULFATE 2 GM/50ML IV SOLN
2.0000 g | Freq: Once | INTRAVENOUS | Status: AC
  Administered 2021-02-25: 16:00:00 2 g via INTRAVENOUS
  Filled 2021-02-25: qty 50

## 2021-02-25 MED ORDER — APIXABAN 2.5 MG PO TABS
5.0000 mg | ORAL_TABLET | Freq: Two times a day (BID) | ORAL | Status: DC
  Administered 2021-02-25 – 2021-02-28 (×6): 5 mg via ORAL
  Filled 2021-02-25 (×6): qty 2

## 2021-02-25 MED ORDER — SODIUM CHLORIDE 0.9% FLUSH: 3.0000 mL | INTRAVENOUS | Status: DC | PRN

## 2021-02-25 MED ORDER — DOFETILIDE 500 MCG PO CAPS
500.0000 ug | ORAL_CAPSULE | Freq: Two times a day (BID) | ORAL | Status: DC
  Administered 2021-02-25 – 2021-02-28 (×6): 500 ug via ORAL
  Filled 2021-02-25 (×3): qty 1
  Filled 2021-02-25 (×2): qty 4
  Filled 2021-02-25 (×3): qty 1

## 2021-02-25 MED ORDER — POLYVINYL ALCOHOL 1.4 % OP SOLN
1.0000 [drp] | Freq: Three times a day (TID) | OPHTHALMIC | Status: DC | PRN
  Filled 2021-02-25: qty 15

## 2021-02-25 MED ORDER — SODIUM CHLORIDE 0.9 % IV SOLN: 250.0000 mL | INTRAVENOUS | Status: DC | PRN

## 2021-02-25 MED ORDER — AMLODIPINE BESYLATE 5 MG PO TABS
5.0000 mg | ORAL_TABLET | Freq: Every day | ORAL | Status: DC
  Administered 2021-02-26 – 2021-02-28 (×3): 5 mg via ORAL
  Filled 2021-02-25 (×3): qty 1

## 2021-02-25 NOTE — Progress Notes (Signed)
Pharmacy: Dofetilide (Tikosyn) - Initial Consult Assessment and Electrolyte Replacement  Pharmacy consulted to assist in monitoring and replacing electrolytes in this 66 y.o. male admitted on 02/25/2021 undergoing dofetilide initiation. First dofetilide dose: 02/25/21  Assessment:  Patient Exclusion Criteria: If any screening criteria checked as "Yes", then  patient  should NOT receive dofetilide until criteria item is corrected.  If Yes please indicate correction plan.  YES  NO Patient  Exclusion Criteria Correction Plan   []   [x]   Baseline QTc interval is greater than or equal to 440 msec. IF above YES box checked dofetilide contraindicated unless patient has ICD; then may proceed if QTc 500-550 msec or with known ventricular conduction abnormalities may proceed with QTc 550-600 msec. QTc = 451    []   [x]   Patient is known or suspected to have a digoxin level greater than 2 ng/ml: Lab Results  Component Value Date   DIGOXIN 0.9 08/17/2007       []   [x]   Creatinine clearance less than 20 ml/min (calculated using Cockcroft-Gault, actual body weight and serum creatinine): Estimated Creatinine Clearance: 69.3 mL/min (by C-G formula based on SCr of 1.13 mg/dL).     []   [x]  Patient has received drugs known to prolong the QT intervals within the last 48 hours (phenothiazines, tricyclics or tetracyclic antidepressants, erythromycin, H-1 antihistamines, cisapride, fluoroquinolones, azithromycin, ondansetron).   Updated information on QT prolonging agents is available to be searched on the following database:QT prolonging agents     []   [x]   Patient received a dose of hydrochlorothiazide (Oretic) alone or in any combination including triamterene (Dyazide, Maxzide) in the last 48 hours.    []   [x]  Patient received a medication known to increase dofetilide plasma concentrations prior to initial dofetilide dose:  Trimethoprim (Primsol, Proloprim) in the last 36 hours Verapamil  (Calan, Verelan) in the last 36 hours or a sustained release dose in the last 72 hours Megestrol (Megace) in the last 5 days  Cimetidine (Tagamet) in the last 6 hours Ketoconazole (Nizoral) in the last 24 hours Itraconazole (Sporanox) in the last 48 hours  Prochlorperazine (Compazine) in the last 36 hours     []   [x]   Patient is known to have a history of torsades de pointes; congenital or acquired long QT syndromes.    []   [x]   Patient has received a Class 1 antiarrhythmic with less than 2 half-lives since last dose. (Disopyramide, Quinidine, Procainamide, Lidocaine, Mexiletine, Flecainide, Propafenone)    []   [x]   Patient has received amiodarone therapy in the past 3 months or amiodarone level is greater than 0.3 ng/ml.    Patient has been appropriately anticoagulated with apixaban.  Labs:    Component Value Date/Time   K 5.6 (H) 02/25/2021 0949   MG 2.0 02/25/2021 0949     Plan: Potassium: K >/= 4: Appropriate to initiate Tikosyn, no replacement needed   Potassium was 5.6 drawn this morning in clinic. Discussed with EP team, will give lokelma 5g x1 and continue with initiation.   Magnesium: Mg 1.8-2: Give Mg 2 gm IV x1 to prevent Mg from dropping below 1.8 - do not need to recheck Mg. Appropriate to initiate Tikosyn   Thank you for allowing pharmacy to participate in this patient's care   Erin Hearing PharmD., BCPS Clinical Pharmacist 02/25/2021 3:04 PM
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
## 2021-02-25 NOTE — Care Management (Signed)
02-25-21 1522 Patient presented for Tikosyn Load. Case Manager will follow for cost and pharmacy of choice.

## 2021-02-25 NOTE — Plan of Care (Signed)

## 2021-02-25 NOTE — TOC Benefit Eligibility Note (Signed)
Patient Product/process development scientist completed.    The patient is currently admitted and upon discharge could be taking dofetilide (Tikosyn) 500 mcg.  The current 30 day co-pay is, $4.15.   The patient is insured through [employer] Part D     Roland Earl, CPhT Pharmacy Patient Advocate Specialist [facility] Patient Advocate Team Direct Number: [phone]  Fax: [phone]

## 2021-02-25 NOTE — Progress Notes (Signed)
Primary Care Physician: Azell Der, MD Referring Physician:Dr. 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 His metoprolol dose was lowered earlier in the year for bradycardia in SR. He has had 4 episodes since the dose was lowered and this last episode has been persistent since last week. EKG shows atrial flutter with variable rate, with v rates in the 50's. Highest v rates have been in 80-90 bpm with exertion. He had a NICM when first dx with afib in 2008. LHC at that time showed normal coronary arteries and EF of 50%. Lat echo 03/2020, showed normal EF with severely dilated left atrium with a diameter of 4.70 and volume of 185 ml.   He denies any alcohol, tobacco, substance abuse,  excessive caffeine. He denies snoring unless he is on his back. No prior sleep study.   He has a CHA2DS2VASc score of 2(HTN, age). Dr. Stanford Breed started him on eliquis 5 mg bid several months ago but the pt states he did not start it as he was afraid of bleeding side effects. He states he has had 5 cardioversion's in the past, as well as some were TEE guided. He would like to have a CV asap. He voices exertional dyspnea, fatigue with being out of rhythm.    F/u in afib clinic, 07/26/19, he has returned to atrial flutter with variable block, v rates in the 60's, 5 days ago. I saw him late May, he was out of rhythm and cardioversion was planned but he self converted. There were several my chart messages that he stated that he was having fatigue on eliquis and wanted to stop it. He was told by PharmD that it was likely 2/2 the BB not eliquis. He has a h/o hep C and pharmacist at [facility] reassured him that eliquis was safe in that situation. He does not like being on meds. Very fatigued. I discussed ablation or change in antiarrythmic and he would prefer to discuss ablation. I feel even if he is  cardioverted, he may go back to afib/flutter soon, as he appears to be failing flecainide.   F/u in afib clinic, 11/13/20. He had an ablation one month ago and did maintain SR for almost 2 weeks. Since then he feels he is out of rhythm. He appears to be in an atrial flutter with variable block today. He continues on flecainide and metoprolol as well as eliquis 5 mg bid for a CHA2DS2VASc  score of 2. We discussed pursing cardioversion and he was in agreement.  F/u 10/26 in afib clinic. Had a successful cardioversion 11/20/20. He is in SR today. He remains on flecainide. No swallowing or groin issues since the procedure. Continues on eliquis. He feels better post cardioversion with return of SR.   Follow up in the AF clinic 12/18/20. Patient reports he was back in afib just a few days after his last office visit with variable heart rates. ECG today shows rate controlled atrial flutter.   F/u in afib clinic 01/08/21. He had cardioversion but had very ERAF. We discussed again need for stronger antiarrythmic, tikosyn. He has f/u with Dr. Rayann Heman 12/16 and he will further discuss with him. He did tell me that he had a bad choking experience with swallowing a pill when he was a child and he currently has to chew up his pills. He was told  that he would not be able to do that with Tikosyn and would have to swallow the capsule  whole.  He remains in rate controlled atrial flutter with controlled rates. He is symptomatic with fatigue being out of rhythm.   F/u in afib clinic, 02/25/21, to be admitted for Tikosyn today. He has had persistent afib despite ablation, cardioversion's and flecainide. He met last with Dr. Rayann Heman and agreed on [place]. He has not missed any anticoagulation doses, no benadryl use. He is off flecainide since December. He does have severe bi atrial enlargement  which is contributing to his persistent afib. He still has a fear of swallowing pills/capsules. He states that he usually chews his meds.    Today, he denies symptoms of chest pain, shortness of breath, orthopnea, PND, lower extremity edema, dizziness, presyncope, syncope, or neurologic sequela. The patient is tolerating medications without difficulties and is otherwise without complaint today.   Past Medical History:  Diagnosis Date   ATRIAL FIBRILLATION 06/16/2008   Qualifier: Diagnosis of  By: Mare Ferrari, RMA, [name]     Atrial fibrillation ([place])    Blood in stool    Cardiomyopathy    improved    DJD (degenerative joint disease) of lumbar spine    Gout    Hepatitis C    Hx of echocardiogram    a. Echo 12/13:  EF 60-65%, mod LAE, mild RAE, PASP 34   Intrinsic asthma, unspecified    Substance abuse ([place])    Unspecified polyarthropathy or polyarthritis, multiple sites    Past Surgical History:  Procedure Laterality Date   ATRIAL FIBRILLATION ABLATION N/A 10/16/2020   Procedure: ATRIAL FIBRILLATION ABLATION;  Surgeon: Thompson Grayer, MD;  Location: [place] [facility];  Service: Cardiovascular;  Laterality: N/A;   CARDIOVERSION N/A 11/20/2020   Procedure: CARDIOVERSION;  Surgeon: Sueanne Margarita, MD;  Location: [facility] ENDOSCOPY;  Service: Cardiovascular;  Laterality: N/A;   CARDIOVERSION N/A 12/31/2020   Procedure: CARDIOVERSION;  Surgeon: Pixie Casino, MD;  Location: [facility] ENDOSCOPY;  Service: Cardiovascular;  Laterality: N/A;   HERNIA REPAIR  1966    Current Outpatient Medications  Medication Sig Dispense Refill   albuterol (VENTOLIN HFA) 108 (90 Base) MCG/ACT inhaler Inhale 2 puffs into the lungs every 6 (six) hours as needed for wheezing or shortness of breath.     amLODipine (NORVASC) 5 MG tablet Take 1 tablet (5 mg total) by mouth daily. 180 tablet 3   apixaban (ELIQUIS) 5 MG TABS tablet Take 1 tablet (5 mg total) by mouth 2 (two) times daily. 60 tablet 5   B Complex-C (B-COMPLEX WITH VITAMIN C) tablet Take 1 tablet by mouth daily.     Glycerin (CLEAR EYES ADV DRY & ITCHY RLF) 0.25 % SOLN Place 1-2 drops into both eyes  3 (three) times daily as needed.     metoprolol tartrate (LOPRESSOR) 50 MG tablet Take 1 tablet (50 mg total) by mouth 2 (two) times daily. 180 tablet 3   Multiple Vitamin (MULTIVITAMIN WITH MINERALS) TABS tablet Take 1 tablet by mouth in the morning.     Potassium 99 MG TABS Take 99 mg by mouth in the morning.     vitamin C (ASCORBIC ACID) 500 MG tablet Take 500 mg by mouth daily.     No current facility-administered medications for this encounter.    Allergies  Allergen Reactions   Indomethacin Other (See Comments)    Headaches and tired    Social History   Socioeconomic History   Marital  status: Married    Spouse name: Not on file   Number of children: Not on file   Years of education: Not on file   Highest education level: Not on file  Occupational History   Not on file  Tobacco Use   Smoking status: Former    Types: Cigarettes    Quit date: 02/02/2014    Years since quitting: 7.0   Smokeless tobacco: Never   Tobacco comments:    started in 1999. smoked 1/4 ppd   Substance and Sexual Activity   Alcohol use: No    Alcohol/week: 0.0 standard drinks    Comment: quit in 2006    Drug use: No    Comment: quit in 2006    Sexual activity: Not on file  Other Topics Concern   Not on file  Social History Narrative   Married; step children; disabled.    Social Determinants of Health   Financial Resource Strain: Not on file  Food Insecurity: Not on file  Transportation Needs: Not on file  Physical Activity: Not on file  Stress: Not on file  Social Connections: Not on file  Intimate Partner Violence: Not on file    Family History  Problem Relation Age of Onset   Heart failure Other        CHF - father and mother. (mother also had severe RA)    ROS- All systems are reviewed and negative except as per the HPI above  Physical Exam: Vitals:   02/25/21 0930  BP: (!) 140/98  Pulse: 84  Weight: 75.2 kg  Height: $Remove'6\' 1"'CZQOyid$  (1.854 m)    Wt Readings from Last 3  Encounters:  02/25/21 75.2 kg  02/10/21 76.1 kg  01/20/21 77.6 kg    Labs: Lab Results  Component Value Date   NA 142 12/18/2020   K 4.6 12/18/2020   CL 106 12/18/2020   CO2 27 12/18/2020   GLUCOSE 97 12/18/2020   BUN 20 12/18/2020   CREATININE 1.24 12/18/2020   CALCIUM 9.0 12/18/2020   MG 2.0 12/18/2020   Lab Results  Component Value Date   INR 2.3 10/01/2008   Lab Results  Component Value Date   CHOL  09/15/2008    170        ATP III CLASSIFICATION:  <200     mg/dL   Desirable  200-239  mg/dL   Borderline High  >=240    mg/dL   High          HDL 38 (L) 09/15/2008   LDLCALC (H) 09/15/2008    117        Total Cholesterol/HDL:CHD Risk Coronary Heart Disease Risk Table                     Men   Women  1/2 Average Risk   3.4   3.3  Average Risk       5.0   4.4  2 X Average Risk   9.6   7.1  3 X Average Risk  23.4   11.0        Use the calculated Patient Ratio above and the CHD Risk Table to determine the patient's CHD Risk.        ATP III CLASSIFICATION (LDL):  <100     mg/dL   Optimal  100-129  mg/dL   Near or Above                    Optimal  130-159  mg/dL   Borderline  160-189  mg/dL   High  >190     mg/dL   Very High   TRIG 77 09/15/2008    GEN- The patient is a well appearing male, alert and oriented x 3 today.   HEENT-head normocephalic, atraumatic, sclera clear, conjunctiva pink, hearing intact, trachea midline. Lungs- Clear to ausculation bilaterally, normal work of breathing Heart- irregular rate and rhythm, no murmurs, rubs or gallops  GI- soft, NT, ND, + BS Extremities- no clubbing, cyanosis, or edema MS- no significant deformity or atrophy Skin- no rash or lesion Psych- euthymic mood, full affect Neuro- strength and sensation are intact   EKG- Vent. rate 73 BPM PR interval * ms QRS duration 120 ms QT/QTcB 410/451 ms P-R-T axes * 30 61 Atrial flutter with variable A-V block Left ventricular hypertrophy with QRS widening (  Sokolow-Lyon , Cornell product ) Abnormal ECG  Echo- FINDINGS -03/22/20 1. Left ventricular ejection fraction, by estimation, is 55 to 60%. The  left ventricle has normal function. The left ventricle has no regional  wall motion abnormalities. There is mild concentric left ventricular  hypertrophy of the posterior segment.  Left ventricular diastolic parameters are indeterminate. The average left ventricular global longitudinal strain is -19.1 %. The global longitudinal strain is normal.   2. Right ventricular systolic function is normal. The right ventricular  size is normal.   3. Left atrial size was severely dilated. LA diam 4.70 cm but with volume of 185 ml  4. Right atrial size was severely dilated.   5. The mitral valve is normal in structure. Mild mitral valve  regurgitation. No evidence of mitral stenosis.   6. Tricuspid valve regurgitation is mild to moderate.   7. The aortic valve is normal in structure. Aortic valve regurgitation is  not visualized. No aortic stenosis is present.   8. The inferior vena cava is normal in size with greater than 50%    Assessment and Plan: 1. Persistent atrial fibrillation/atrial flutter  S/p ablation  10/16/20 Patient has had issue with recurrent rate controlled atrial flutter since ablation, despite flecainide and cardioversions He is here now for tikosyn admit  Severely dilated LA  probably contributing to persistent afib  He is not a good candidate for amiodarone due to relative  young age and chronic liver issues  He had the repeat cardioversion 01/08/21 and did shock out but had very early return of atrial flutter within 24 hours  He will have to be able to swallow the pill intact and not open capsule or chew it up Continue metoprolol tartrate 25 mg BID Off flecainide since December No benadryl use   2. CHA2DS2VASc score of 2 Continue eliquis 5 mg bid  No missed doses for at least 3 weeks   3. HTN Stable, no changes today.   To 6E  when bed is ready    Geroge Baseman. Alexianna Nachreiner, [facility] [address] [place], [place] [address] [phone]

## 2021-02-25 NOTE — H&P (Deleted)
Electrophysiology H&P  Note   Primary Care Physician: Azell Der, MD Referring Physician:Dr. 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 His metoprolol dose was lowered earlier in the year for bradycardia in SR. He has had 4 episodes since the dose was lowered and this last episode has been persistent since last week. EKG shows atrial flutter with variable rate, with v rates in the 50's. Highest v rates have been in 80-90 bpm with exertion. He had a NICM when first dx with afib in 2008. LHC at that time showed normal coronary arteries and EF of 50%. Lat echo 03/2020, showed normal EF with severely dilated left atrium with a diameter of 4.70 and volume of 185 ml.   He denies any alcohol, tobacco, substance abuse,  excessive caffeine. He denies snoring unless he is on his back. No prior sleep study.   He has a CHA2DS2VASc score of 2(HTN, age). Dr. Stanford Breed started him on eliquis 5 mg bid several months ago but the pt states he did not start it as he was afraid of bleeding side effects. He states he has had 5 cardioversion's in the past, as well as some were TEE guided. He would like to have a CV asap. He voices exertional dyspnea, fatigue with being out of rhythm.    F/u in afib clinic, 07/26/19, he has returned to atrial flutter with variable block, v rates in the 60's, 5 days ago. I saw him late May, he was out of rhythm and cardioversion was planned but he self converted. There were several my chart messages that he stated that he was having fatigue on eliquis and wanted to stop it. He was told by PharmD that it was likely 2/2 the BB not eliquis. He has a h/o hep C and pharmacist at [facility] reassured him that eliquis was safe in that situation. He does not like being on meds. Very fatigued. I discussed ablation or change in antiarrythmic and he would prefer to discuss  ablation. I feel even if he is cardioverted, he may go back to afib/flutter soon, as he appears to be failing flecainide.   F/u in afib clinic, 11/13/20. He had an ablation one month ago and did maintain SR for almost 2 weeks. Since then he feels he is out of rhythm. He appears to be in an atrial flutter with variable block today. He continues on flecainide and metoprolol as well as eliquis 5 mg bid for a CHA2DS2VASc  score of 2. We discussed pursing cardioversion and he was in agreement.  F/u 10/26 in afib clinic. Had a successful cardioversion 11/20/20. He is in SR today. He remains on flecainide. No swallowing or groin issues since the procedure. Continues on eliquis. He feels better post cardioversion with return of SR.   Follow up in the AF clinic 12/18/20. Patient reports he was back in afib just a few days after his last office visit with variable heart rates. ECG today shows rate controlled atrial flutter.   F/u in afib clinic 01/08/21. He had cardioversion but had very ERAF. We discussed again need for stronger antiarrythmic, tikosyn. He has f/u with Dr. Rayann Heman 12/16 and he will further discuss with him. He did tell me that he had a bad choking experience with swallowing a pill when he was a child and he currently has to chew up  his pills. He was told that he would not be able to do that with Tikosyn and would have to swallow the capsule  whole.  He remains in rate controlled atrial flutter with controlled rates. He is symptomatic with fatigue being out of rhythm.   F/u in afib clinic, 02/25/21, to be admitted for Tikosyn today. He has had persistent afib despite ablation, cardioversion's and flecainide. He met last with Dr. Rayann Heman and agreed on [place]. He has not missed any anticoagulation doses, no benadryl use. He is off flecainide since December. He does have severe bi atrial enlargement  which is contributing to his persistent afib. He still has a fear of swallowing pills/capsules. He states  that he usually chews his meds.   Today, he denies symptoms of chest pain, shortness of breath, orthopnea, PND, lower extremity edema, dizziness, presyncope, syncope, or neurologic sequela. The patient is tolerating medications without difficulties and is otherwise without complaint today.   Past Medical History:  Diagnosis Date   ATRIAL FIBRILLATION 06/16/2008   Qualifier: Diagnosis of  By: Mare Ferrari, RMA, [name]     Atrial fibrillation ([place])    Blood in stool    Cardiomyopathy    improved    DJD (degenerative joint disease) of lumbar spine    Gout    Hepatitis C    Hx of echocardiogram    a. Echo 12/13:  EF 60-65%, mod LAE, mild RAE, PASP 34   Intrinsic asthma, unspecified    Substance abuse ([place])    Unspecified polyarthropathy or polyarthritis, multiple sites    Past Surgical History:  Procedure Laterality Date   ATRIAL FIBRILLATION ABLATION N/A 10/16/2020   Procedure: ATRIAL FIBRILLATION ABLATION;  Surgeon: Thompson Grayer, MD;  Location: [place] CV LAB;  Service: Cardiovascular;  Laterality: N/A;   CARDIOVERSION N/A 11/20/2020   Procedure: CARDIOVERSION;  Surgeon: Sueanne Margarita, MD;  Location: [place] ENDOSCOPY;  Service: Cardiovascular;  Laterality: N/A;   CARDIOVERSION N/A 12/31/2020   Procedure: CARDIOVERSION;  Surgeon: Pixie Casino, MD;  Location: [place] ENDOSCOPY;  Service: Cardiovascular;  Laterality: N/A;   HERNIA REPAIR  1966    Current Facility-Administered Medications  Medication Dose Route Frequency Provider Last Rate Last Admin   0.9 %  sodium chloride infusion  250 mL Intravenous PRN Sherran Needs, NP       acetaminophen (TYLENOL) tablet 650 mg  650 mg Oral Q6H PRN Shirley Friar, PA-C   650 mg at 02/25/21 1402   [START ON 02/26/2021] amLODipine (NORVASC) tablet 5 mg  5 mg Oral Daily Sherran Needs, NP       apixaban Arne [place]) tablet 5 mg  5 mg Oral BID Sherran Needs, NP       dofetilide (TIKOSYN) capsule 500 mcg  500 mcg Oral BID Sherran Needs,  NP       Glycerin 0.25 % SOLN 1-2 drop  1-2 drop Both Eyes TID PRN Sherran Needs, NP       metoprolol tartrate (LOPRESSOR) tablet 50 mg  50 mg Oral BID Sherran Needs, NP       sodium chloride flush (NS) 0.9 % injection 3 mL  3 mL Intravenous Q12H Sherran Needs, NP       sodium chloride flush (NS) 0.9 % injection 3 mL  3 mL Intravenous PRN Sherran Needs, NP        Allergies  Allergen Reactions   Indomethacin Other (See Comments)    Headaches and tired  Social History   Socioeconomic History   Marital status: Married    Spouse name: Not on file   Number of children: Not on file   Years of education: Not on file   Highest education level: Not on file  Occupational History   Not on file  Tobacco Use   Smoking status: Former    Types: Cigarettes    Quit date: 02/02/2014    Years since quitting: 7.0   Smokeless tobacco: Never   Tobacco comments:    started in 1999. smoked 1/4 ppd   Substance and Sexual Activity   Alcohol use: No    Alcohol/week: 0.0 standard drinks    Comment: quit in 2006    Drug use: No    Comment: quit in 2006    Sexual activity: Not on file  Other Topics Concern   Not on file  Social History Narrative   Married; step children; disabled.    Social Determinants of Health   Financial Resource Strain: Not on file  Food Insecurity: Not on file  Transportation Needs: Not on file  Physical Activity: Not on file  Stress: Not on file  Social Connections: Not on file  Intimate Partner Violence: Not on file    Family History  Problem Relation Age of Onset   Heart failure Other        CHF - father and mother. (mother also had severe RA)    ROS- All systems are reviewed and negative except as per the HPI above  Physical Exam: Vitals:   02/25/21 1341  BP: (!) 138/92  Pulse: 92  Resp: 15  Temp: (!) 97.5 F (36.4 C)  TempSrc: Oral  SpO2: 98%    Wt Readings from Last 3 Encounters:  02/25/21 75.2 kg  02/10/21 76.1 kg  01/20/21 77.6  kg    Labs: Lab Results  Component Value Date   NA 139 02/25/2021   K 5.6 (H) 02/25/2021   CL 103 02/25/2021   CO2 30 02/25/2021   GLUCOSE 116 (H) 02/25/2021   BUN 20 02/25/2021   CREATININE 1.13 02/25/2021   CALCIUM 9.4 02/25/2021   MG 2.0 02/25/2021   Lab Results  Component Value Date   INR 2.3 10/01/2008   Lab Results  Component Value Date   CHOL  09/15/2008    170        ATP III CLASSIFICATION:  <200     mg/dL   Desirable  200-239  mg/dL   Borderline High  >=240    mg/dL   High          HDL 38 (L) 09/15/2008   LDLCALC (H) 09/15/2008    117        Total Cholesterol/HDL:CHD Risk Coronary Heart Disease Risk Table                     Men   Women  1/2 Average Risk   3.4   3.3  Average Risk       5.0   4.4  2 X Average Risk   9.6   7.1  3 X Average Risk  23.4   11.0        Use the calculated Patient Ratio above and the CHD Risk Table to determine the patient's CHD Risk.        ATP III CLASSIFICATION (LDL):  <100     mg/dL   Optimal  100-129  mg/dL   Near or Above  Optimal  130-159  mg/dL   Borderline  160-189  mg/dL   High  >190     mg/dL   Very High   TRIG 77 09/15/2008    GEN- The patient is a well appearing male, alert and oriented x 3 today.   HEENT-head normocephalic, atraumatic, sclera clear, conjunctiva pink, hearing intact, trachea midline. Lungs- Clear to ausculation bilaterally, normal work of breathing Heart- irregular rate and rhythm, no murmurs, rubs or gallops  GI- soft, NT, ND, + BS Extremities- no clubbing, cyanosis, or edema MS- no significant deformity or atrophy Skin- no rash or lesion Psych- euthymic mood, full affect Neuro- strength and sensation are intact   EKG- Vent. rate 73 BPM PR interval * ms QRS duration 120 ms QT/QTcB 410/451 ms P-R-T axes * 30 61 Atrial flutter with variable A-V block Left ventricular hypertrophy with QRS widening ( Sokolow-Lyon , Cornell product ) Abnormal ECG  Echo- FINDINGS  -03/22/20 1. Left ventricular ejection fraction, by estimation, is 55 to 60%. The  left ventricle has normal function. The left ventricle has no regional  wall motion abnormalities. There is mild concentric left ventricular  hypertrophy of the posterior segment.  Left ventricular diastolic parameters are indeterminate. The average left ventricular global longitudinal strain is -19.1 %. The global longitudinal strain is normal.   2. Right ventricular systolic function is normal. The right ventricular  size is normal.   3. Left atrial size was severely dilated. LA diam 4.70 cm but with volume of 185 ml  4. Right atrial size was severely dilated.   5. The mitral valve is normal in structure. Mild mitral valve  regurgitation. No evidence of mitral stenosis.   6. Tricuspid valve regurgitation is mild to moderate.   7. The aortic valve is normal in structure. Aortic valve regurgitation is  not visualized. No aortic stenosis is present.   8. The inferior vena cava is normal in size with greater than 50%    Assessment and Plan: 1. Persistent atrial fibrillation/atrial flutter  S/p ablation  10/16/20 Patient has had issue with recurrent rate controlled atrial flutter since ablation, despite flecainide and cardioversions He is here now for tikosyn admit  Severely dilated LA  probably contributing to persistent afib  He is not a good candidate for amiodarone due to relative  young age and chronic liver issues  He had the repeat cardioversion 01/08/21 and did shock out but had very early return of atrial flutter within 24 hours  He will have to be able to swallow the pill intact and not open capsule or chew it up Continue metoprolol tartrate 25 mg BID Off flecainide since December No benadryl use   2. CHA2DS2VASc score of 2 Continue eliquis 5 mg bid  No missed doses for at least 3 weeks   3. HTN Stable, no changes today.   Presents to [facility] for [place] admission as above.   Legrand Como [address]  [place], PA-C  02/25/2021 2:14 PM

## 2021-02-25 NOTE — Progress Notes (Signed)
Primary Care Physician: Azell Der, MD Referring Physician:Dr. 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 His metoprolol dose was lowered earlier in the year for bradycardia in SR. He has had 4 episodes since the dose was lowered and this last episode has been persistent since last week. EKG shows atrial flutter with variable rate, with v rates in the 50's. Highest v rates have been in 80-90 bpm with exertion. He had a NICM when first dx with afib in 2008. LHC at that time showed normal coronary arteries and EF of 50%. Lat echo 03/2020, showed normal EF with severely dilated left atrium with a diameter of 4.70 and volume of 185 ml.   He denies any alcohol, tobacco, substance abuse,  excessive caffeine. He denies snoring unless he is on his back. No prior sleep study.   He has a CHA2DS2VASc score of 2(HTN, age). Dr. Stanford Breed started him on eliquis 5 mg bid several months ago but the pt states he did not start it as he was afraid of bleeding side effects. He states he has had 5 cardioversion's in the past, as well as some were TEE guided. He would like to have a CV asap. He voices exertional dyspnea, fatigue with being out of rhythm.    F/u in afib clinic, 07/26/19, he has returned to atrial flutter with variable block, v rates in the 60's, 5 days ago. I saw him late May, he was out of rhythm and cardioversion was planned but he self converted. There were several my chart messages that he stated that he was having fatigue on eliquis and wanted to stop it. He was told by PharmD that it was likely 2/2 the BB not eliquis. He has a h/o hep C and pharmacist at [facility] reassured him that eliquis was safe in that situation. He does not like being on meds. Very fatigued. I discussed ablation or change in antiarrythmic and he would prefer to discuss ablation. I feel even if he is  cardioverted, he may go back to afib/flutter soon, as he appears to be failing flecainide.   F/u in afib clinic, 11/13/20. He had an ablation one month ago and did maintain SR for almost 2 weeks. Since then he feels he is out of rhythm. He appears to be in an atrial flutter with variable block today. He continues on flecainide and metoprolol as well as eliquis 5 mg bid for a CHA2DS2VASc  score of 2. We discussed pursing cardioversion and he was in agreement.  F/u 10/26 in afib clinic. Had a successful cardioversion 11/20/20. He is in SR today. He remains on flecainide. No swallowing or groin issues since the procedure. Continues on eliquis. He feels better post cardioversion with return of SR.   Follow up in the AF clinic 12/18/20. Patient reports he was back in afib just a few days after his last office visit with variable heart rates. ECG today shows rate controlled atrial flutter.   F/u in afib clinic 01/08/21. He had cardioversion but had very ERAF. We discussed again need for stronger antiarrythmic, tikosyn. He has f/u with Dr. Rayann Heman 12/16 and he will further discuss with him. He did tell me that he had a bad choking experience with swallowing a pill when he was a child and he currently has to chew up his pills. He was told  that he would not be able to do that with Tikosyn and would have to swallow the capsule  whole.  He remains in rate controlled atrial flutter with controlled rates. He is symptomatic with fatigue being out of rhythm.   F/u in afib clinic, 02/25/21, to be admitted for Tikosyn today. He has had persistent afib despite ablation, cardioversion's and flecainide. He met last with Dr. Rayann Heman and agreed on [place]. He has not missed any anticoagulation doses, no benadryl use. He is off flecainide. He does have severe bi atrial enlargement  which is contributing to his persistent afib.   Today, he denies symptoms of chest pain, shortness of breath, orthopnea, PND, lower extremity edema,  dizziness, presyncope, syncope, or neurologic sequela. The patient is tolerating medications without difficulties and is otherwise without complaint today.   Past Medical History:  Diagnosis Date   ATRIAL FIBRILLATION 06/16/2008   Qualifier: Diagnosis of  By: Mare Ferrari, RMA, [name]     Atrial fibrillation ([place])    Blood in stool    Cardiomyopathy    improved    DJD (degenerative joint disease) of lumbar spine    Gout    Hepatitis C    Hx of echocardiogram    a. Echo 12/13:  EF 60-65%, mod LAE, mild RAE, PASP 34   Intrinsic asthma, unspecified    Substance abuse ([place])    Unspecified polyarthropathy or polyarthritis, multiple sites    Past Surgical History:  Procedure Laterality Date   ATRIAL FIBRILLATION ABLATION N/A 10/16/2020   Procedure: ATRIAL FIBRILLATION ABLATION;  Surgeon: Thompson Grayer, MD;  Location: [facility];  Service: Cardiovascular;  Laterality: N/A;   CARDIOVERSION N/A 11/20/2020   Procedure: CARDIOVERSION;  Surgeon: Sueanne Margarita, MD;  Location: [facility] ENDOSCOPY;  Service: Cardiovascular;  Laterality: N/A;   CARDIOVERSION N/A 12/31/2020   Procedure: CARDIOVERSION;  Surgeon: Pixie Casino, MD;  Location: [facility] ENDOSCOPY;  Service: Cardiovascular;  Laterality: N/A;   HERNIA REPAIR  1966    Current Outpatient Medications  Medication Sig Dispense Refill   albuterol (VENTOLIN HFA) 108 (90 Base) MCG/ACT inhaler Inhale 2 puffs into the lungs every 6 (six) hours as needed for wheezing or shortness of breath.     amLODipine (NORVASC) 5 MG tablet Take 1 tablet (5 mg total) by mouth daily. 180 tablet 3   apixaban (ELIQUIS) 5 MG TABS tablet Take 1 tablet (5 mg total) by mouth 2 (two) times daily. 60 tablet 5   B Complex-C (B-COMPLEX WITH VITAMIN C) tablet Take 1 tablet by mouth daily.     Glycerin (CLEAR EYES ADV DRY & ITCHY RLF) 0.25 % SOLN Place 1-2 drops into both eyes 3 (three) times daily as needed.     metoprolol tartrate (LOPRESSOR) 50 MG tablet Take 1 tablet (50 mg  total) by mouth 2 (two) times daily. 180 tablet 3   Multiple Vitamin (MULTIVITAMIN WITH MINERALS) TABS tablet Take 1 tablet by mouth in the morning.     Potassium 99 MG TABS Take 99 mg by mouth in the morning.     vitamin C (ASCORBIC ACID) 500 MG tablet Take 500 mg by mouth daily.     No current facility-administered medications for this encounter.    Allergies  Allergen Reactions   Indomethacin Other (See Comments)    Headaches and tired    Social History   Socioeconomic History   Marital status: Married    Spouse name: Not on file   Number of children: Not on file  Years of education: Not on file   Highest education level: Not on file  Occupational History   Not on file  Tobacco Use   Smoking status: Former    Types: Cigarettes    Quit date: 02/02/2014    Years since quitting: 7.0   Smokeless tobacco: Never   Tobacco comments:    started in 1999. smoked 1/4 ppd   Substance and Sexual Activity   Alcohol use: No    Alcohol/week: 0.0 standard drinks    Comment: quit in 2006    Drug use: No    Comment: quit in 2006    Sexual activity: Not on file  Other Topics Concern   Not on file  Social History Narrative   Married; step children; disabled.    Social Determinants of Health   Financial Resource Strain: Not on file  Food Insecurity: Not on file  Transportation Needs: Not on file  Physical Activity: Not on file  Stress: Not on file  Social Connections: Not on file  Intimate Partner Violence: Not on file    Family History  Problem Relation Age of Onset   Heart failure Other        CHF - father and mother. (mother also had severe RA)    ROS- All systems are reviewed and negative except as per the HPI above  Physical Exam: Vitals:   02/25/21 0930  Weight: 75.2 kg  Height: $Remove'6\' 1"'[id number]$  (1.854 m)    Wt Readings from Last 3 Encounters:  02/25/21 75.2 kg  02/10/21 76.1 kg  01/20/21 77.6 kg    Labs: Lab Results  Component Value Date   NA 142 12/18/2020   K  4.6 12/18/2020   CL 106 12/18/2020   CO2 27 12/18/2020   GLUCOSE 97 12/18/2020   BUN 20 12/18/2020   CREATININE 1.24 12/18/2020   CALCIUM 9.0 12/18/2020   MG 2.0 12/18/2020   Lab Results  Component Value Date   INR 2.3 10/01/2008   Lab Results  Component Value Date   CHOL  09/15/2008    170        ATP III CLASSIFICATION:  <200     mg/dL   Desirable  200-239  mg/dL   Borderline High  >=240    mg/dL   High          HDL 38 (L) 09/15/2008   LDLCALC (H) 09/15/2008    117        Total Cholesterol/HDL:CHD Risk Coronary Heart Disease Risk Table                     Men   Women  1/2 Average Risk   3.4   3.3  Average Risk       5.0   4.4  2 X Average Risk   9.6   7.1  3 X Average Risk  23.4   11.0        Use the calculated Patient Ratio above and the CHD Risk Table to determine the patient's CHD Risk.        ATP III CLASSIFICATION (LDL):  <100     mg/dL   Optimal  100-129  mg/dL   Near or Above                    Optimal  130-159  mg/dL   Borderline  160-189  mg/dL   High  >190     mg/dL   Very High   TRIG  77 09/15/2008    GEN- The patient is a well appearing male, alert and oriented x 3 today.   HEENT-head normocephalic, atraumatic, sclera clear, conjunctiva pink, hearing intact, trachea midline. Lungs- Clear to ausculation bilaterally, normal work of breathing Heart- irregular rate and rhythm, no murmurs, rubs or gallops  GI- soft, NT, ND, + BS Extremities- no clubbing, cyanosis, or edema MS- no significant deformity or atrophy Skin- no rash or lesion Psych- euthymic mood, full affect Neuro- strength and sensation are intact   EKG= Vent. rate 84 BPM PR interval * ms QRS duration 104 ms QT/QTcB 382/451 ms P-R-T axes * 23 49 Atrial fibrillation Minimal voltage criteria for LVH, may be normal variant ( Sokolow-Lyon ) Cannot rule out Anterior infarct , age undetermined Abnormal ECG   Echo- FINDINGS -03/22/20 1. Left ventricular ejection fraction, by  estimation, is 55 to 60%. The  left ventricle has normal function. The left ventricle has no regional  wall motion abnormalities. There is mild concentric left ventricular  hypertrophy of the posterior segment.  Left ventricular diastolic parameters are indeterminate. The average left ventricular global longitudinal strain is -19.1 %. The global longitudinal strain is normal.   2. Right ventricular systolic function is normal. The right ventricular  size is normal.   3. Left atrial size was severely dilated. LA diam 4.70 cm but with volume of 185 ml  4. Right atrial size was severely dilated.   5. The mitral valve is normal in structure. Mild mitral valve  regurgitation. No evidence of mitral stenosis.   6. Tricuspid valve regurgitation is mild to moderate.   7. The aortic valve is normal in structure. Aortic valve regurgitation is  not visualized. No aortic stenosis is present.   8. The inferior vena cava is normal in size with greater than 50%    Assessment and Plan: 1. Persistent atrial fibrillation/atrial flutter  S/p ablation  10/16/20 Patient has had issue with recurrent rate controlled atrial flutter since ablation  We discussed rhythm control options including changing AAD vs repeat DCCV. We discussed dofetilide admission in detail today, information sheet given. Patient wanted like to try DCCV one more time before committing to dofetilide admission since he is still in the 3 month period after ablation. We did discuss his severely dilated LA may necessitate changing to dofetilide eventually. He is not a good candidate for amiodarone due to relative  young age and chronic liver issues  He had the repeat cardioversion 01/08/21 and did shock out but had very early return of atrial flutter within 24 hours  We again discussed dofetilide and pt wants to  speak with Dr. Rayann Heman with his upcoming appointment 12/16 He will have to be able to swallow the pill intact and not open capsule or chew  it up He will practice swallowing pills on applesauce His other pills metoprolol/ Flecainide /eliquis are not meant to be chewed as well   Continue metoprolol tartrate 25 mg BID Continue flecainide 100 mg bid for now  2. CHA2DS2VASc score of 2 Continue eliquis 5 mg bid   3. HTN Stable, no changes today.    Informed pt to let us know his plans after meeting with Dr. Rayann Heman  and if Tikosyn is decided upon, can get that scheduled for pt  He will require a 3 day washout of flecainide prior to admit     Butch Penny C. Bengie Kaucher, [facility] [address] [place], [place] [address] [phone]

## 2021-02-26 LAB — BASIC METABOLIC PANEL
Anion gap: 8 (ref 5–15)
BUN: 23 mg/dL (ref 8–23)
CO2: 24 mmol/L (ref 22–32)
Calcium: 8.8 mg/dL — ABNORMAL LOW (ref 8.9–10.3)
Chloride: 102 mmol/L (ref 98–111)
Creatinine, Ser: 1.04 mg/dL (ref 0.61–1.24)
GFR, Estimated: >60 mL/min (ref >60)
Glucose, Bld: 94 mg/dL (ref 70–99)
Potassium: 3.8 mmol/L (ref 3.5–5.1)
Sodium: 134 mmol/L — ABNORMAL LOW (ref 135–145)

## 2021-02-26 LAB — MAGNESIUM: Magnesium: 2 mg/dL (ref 1.7–2.4)

## 2021-02-26 MED ORDER — KETOROLAC TROMETHAMINE 10 MG PO TABS
10.0000 mg | ORAL_TABLET | Freq: Once | ORAL | Status: AC
  Administered 2021-02-26: 12:00:00 10 mg via ORAL
  Filled 2021-02-26: qty 1

## 2021-02-26 MED ORDER — MAGNESIUM SULFATE 2 GM/50ML IV SOLN
2.0000 g | Freq: Once | INTRAVENOUS | Status: AC
  Administered 2021-02-26: 09:00:00 2 g via INTRAVENOUS
  Filled 2021-02-26: qty 50

## 2021-02-26 MED ORDER — ZOLPIDEM TARTRATE 5 MG PO TABS
5.0000 mg | ORAL_TABLET | Freq: Every evening | ORAL | Status: DC | PRN
  Administered 2021-02-26 – 2021-02-27 (×2): 5 mg via ORAL
  Filled 2021-02-26 (×2): qty 1

## 2021-02-26 MED ORDER — POTASSIUM CHLORIDE CRYS ER 20 MEQ PO TBCR
40.0000 meq | EXTENDED_RELEASE_TABLET | Freq: Once | ORAL | Status: AC
  Administered 2021-02-26: 09:00:00 40 meq via ORAL
  Filled 2021-02-26: qty 2

## 2021-02-26 NOTE — Progress Notes (Addendum)
Electrophysiology Rounding Note  Patient Name: Scott Wagner Date of Encounter: 02/26/2021  Primary Cardiologist: None  Electrophysiologist: Dr. Curt Bears   Subjective   Pt remains in afib on Tikosyn 500 mcg BID   QTc from EKG last pm shows stable QTc   The patient is doing well today.  At this time, the patient denies chest pain, shortness of breath, or any new concerns.  Inpatient Medications    Scheduled Meds:  amLODipine  5 mg Oral Daily   apixaban  5 mg Oral BID   dofetilide  500 mcg Oral BID   ketorolac  10 mg Oral Once   metoprolol tartrate  50 mg Oral BID   potassium chloride  40 mEq Oral Once   sodium chloride flush  3 mL Intravenous Q12H   Continuous Infusions:  sodium chloride     magnesium sulfate bolus IVPB     PRN Meds: sodium chloride, acetaminophen, polyvinyl alcohol, sodium chloride flush   Vital Signs    Vitals:   02/25/21 1341 02/25/21 2058 02/26/21 0059 02/26/21 0517  BP: (!) 138/92 127/86 124/89 (!) 137/98  Pulse: 92 81 80 86  Resp: 15 16 16 16   Temp: (!) 97.5 F (36.4 C) 97.6 F (36.4 C) 97.6 F (36.4 C) 97.7 F (36.5 C)  TempSrc: Oral Oral Oral Oral  SpO2: 98% 95% 94% 93%  Weight: 75.2 kg     Height: 6\' 1"  (1.854 m)       Intake/Output Summary (Last 24 hours) at 02/26/2021 0817 Last data filed at 02/25/2021 2100 Gross per 24 hour  Intake 290 ml  Output --  Net 290 ml   Filed Weights   02/25/21 1341  Weight: 75.2 kg    Physical Exam    GEN- The patient is well appearing, alert and oriented x 3 today.   Head- normocephalic, atraumatic Eyes-  Sclera clear, conjunctiva pink Ears- hearing intact Oropharynx- clear Neck- supple Lungs- Clear to ausculation bilaterally, normal work of breathing Heart- Irregularly irregular rate and rhythm, no murmurs, rubs or gallops GI- soft, NT, ND, + BS Extremities- no clubbing, cyanosis, or edema Skin- no rash or lesion Psych- euthymic mood, full affect Neuro- strength and sensation  are intact  Labs    CBC No results for input(s): WBC, NEUTROABS, HGB, HCT, MCV, PLT in the last 72 hours. Basic Metabolic Panel Recent Labs    02/25/21 0949 02/26/21 0403  NA 139 134*  K 5.6* 3.8  CL 103 102  CO2 30 24  GLUCOSE 116* 94  BUN 20 23  CREATININE 1.13 1.04  CALCIUM 9.4 8.8*  MG 2.0 2.0    Potassium  Date/Time Value Ref Range Status  02/26/2021 04:03 AM 3.8 3.5 - 5.1 mmol/L Final    Comment:    DELTA CHECK NOTED   Magnesium  Date/Time Value Ref Range Status  02/26/2021 04:03 AM 2.0 1.7 - 2.4 mg/dL Final    Comment:    Performed at [facility], [place] [address] [place], [place] [address]    Telemetry    AF 80-90s (personally reviewed)  Radiology    No results found.   Patient Profile     Scott Wagner is a 66 y.o. male with a past medical history significant for persistent atrial fibrillation.  They were admitted for tikosyn load.   Assessment & Plan    Persistent atrial fibrillation/flutter Pt remains in afib on Tikosyn 500 mcg BID  Continue Eliquis K 3.8 Mg 2.0 CHA2DS2VASC  is at least 2   If pt does not convert chemically, plan on DCCV tomorrow      For questions or updates, please contact [facility] [place] Please consult [email or web address] for contact info under Cardiology/STEMI.  Signed, Shirley Friar, PA-C  02/26/2021, 8:17 AM   I have seen and examined this patient with Oda Kilts.  Agree with above, note added to reflect my findings.  Patient remains in atrial fibrillation.  Did not sleep well overnight, but otherwise without complaint.  GEN: Well nourished, well developed, in no acute distress  HEENT: normal  Neck: no JVD, carotid bruits, or masses Cardiac: Irregular; no murmurs, rubs, or gallops,no edema  Respiratory:  clear to auscultation bilaterally, normal work of breathing GI: soft, nontender, nondistended, + BS MS: no deformity or atrophy  Skin: warm and dry Neuro:  Strength and sensation are  intact Psych: euthymic mood, full affect   Persistent atrial fibrillation: Remains in atrial fibrillation.  Currently on dofetilide 500 mcg twice daily.  QTC is remained stable.  We Garnetta Fedrick continue with current dose.  Yuliza Cara M. Rai Sinagra MD 02/26/2021 8:40 AM

## 2021-02-26 NOTE — Progress Notes (Signed)
Pharmacy: Dofetilide (Tikosyn) - Follow Up Assessment and Electrolyte Replacement  Pharmacy consulted to assist in monitoring and replacing electrolytes in this 66 y.o. male admitted on 02/25/2021 undergoing dofetilide initiation. First dofetilide dose: 02/25/21.   Labs:    Component Value Date/Time   K 3.8 02/26/2021 0403   MG 2.0 02/26/2021 0403     Plan: Potassium: K 3.8-3.9:  Give KCl 40 mEq po x1   Magnesium: Mg 1.8-2: Give Mg 2 gm IV x1   Patient hyperkalemic on admit, received low dose of lokelma, now down just below goal. Do not anticipate patient needing potassium supplements at discharge given how his potassium usually runs, but will continue to follow.   Thank you for allowing pharmacy to participate in this patient's care   Sheppard Coil PharmD., BCPS Clinical Pharmacist 02/26/2021 7:57 AM

## 2021-02-26 NOTE — Progress Notes (Signed)
Morning EKG reviewed    Shows pt remains in afib at 73 bpm with stable QTc at ~480 ms.  Continue  Tikosyn 500 mcg BID.   Pt will be NPO after midnight for DCCV if remains in afib   Graciella Freer, [place]  Pager: [phone]  02/26/2021 11:26 AM

## 2021-02-27 LAB — BASIC METABOLIC PANEL
Anion gap: 6 (ref 5–15)
BUN: 24 mg/dL — ABNORMAL HIGH (ref 8–23)
CO2: 26 mmol/L (ref 22–32)
Calcium: 9.1 mg/dL (ref 8.9–10.3)
Chloride: 107 mmol/L (ref 98–111)
Creatinine, Ser: 1.06 mg/dL (ref 0.61–1.24)
GFR, Estimated: >60 mL/min (ref >60)
Glucose, Bld: 98 mg/dL (ref 70–99)
Potassium: 4.3 mmol/L (ref 3.5–5.1)
Sodium: 139 mmol/L (ref 135–145)

## 2021-02-27 LAB — MAGNESIUM: Magnesium: 1.9 mg/dL (ref 1.7–2.4)

## 2021-02-27 MED ORDER — MAGNESIUM SULFATE 2 GM/50ML IV SOLN: 2.0000 g | Freq: Once | INTRAVENOUS | Status: DC

## 2021-02-27 MED ORDER — METOPROLOL TARTRATE 25 MG PO TABS
25.0000 mg | ORAL_TABLET | Freq: Two times a day (BID) | ORAL | Status: DC
  Administered 2021-02-28: 25 mg via ORAL
  Filled 2021-02-27: qty 1

## 2021-02-27 MED ORDER — MAGNESIUM SULFATE 2 GM/50ML IV SOLN
2.0000 g | Freq: Once | INTRAVENOUS | Status: AC
  Administered 2021-02-27: 2 g via INTRAVENOUS
  Filled 2021-02-27: qty 50

## 2021-02-27 NOTE — Care Management (Signed)
1524 02-27-21 Case Manager spoke with patient regarding Tikosyn cost. Patient is agreeable to cost. Patient would like the initial Rx sent to [facility] and the Rx refills to be e-scribed to [facility] [place], [place]. No further needs from Case Manager at this time.

## 2021-02-27 NOTE — Progress Notes (Signed)
Morning EKG reviewed    Shows remains in NSR at 49 bpm with stable QTc when measured manually and accounted for rate.   Continue  Tikosyn 500 mcg BID for now.   Decrease metoprolol with bradycardia.   Plan for home tomorrow if QTc remains stable.   Graciella Freer, PA-C  Pager: [phone]  02/27/2021 10:50 AM

## 2021-02-27 NOTE — Progress Notes (Addendum)
Patient stated he saw his heart rate drop to the 30s and he had some associated dizziness.  RN checked tele HR noted to be in the 40s.  RN notified Otilio Saber, PA via secure chat, Mardelle Matte dc'd metoprolol and stated may resume tomorrow at half dose.

## 2021-02-27 NOTE — Progress Notes (Addendum)
Electrophysiology Rounding Note  Patient Name: Scott Wagner Date of Encounter: 02/27/2021  Primary Cardiologist: None  Electrophysiologist: Dr. Elberta Fortis   Subjective   Pt converted to sinus rhythm on Tikosyn 500 mcg BID   QTc from EKG last pm shows stable QTc at ~470  The patient is doing well today.  At this time, the patient denies chest pain, shortness of breath, or any new concerns.  Inpatient Medications    Scheduled Meds:  amLODipine  5 mg Oral Daily   apixaban  5 mg Oral BID   dofetilide  500 mcg Oral BID   metoprolol tartrate  50 mg Oral BID   sodium chloride flush  3 mL Intravenous Q12H   Continuous Infusions:  sodium chloride     PRN Meds: sodium chloride, acetaminophen, polyvinyl alcohol, sodium chloride flush, zolpidem   Vital Signs    Vitals:   02/26/21 0836 02/26/21 1147 02/26/21 2027 02/27/21 0617  BP: 112/76 104/70 113/61 125/78  Pulse: 91 78 69 60  Resp: 18 16 17 17   Temp: 97.6 F (36.4 C) 97.8 F (36.6 C) 97.6 F (36.4 C) 97.9 F (36.6 C)  TempSrc: Oral Oral Oral Oral  SpO2: 95%  95% 96%  Weight:      Height:        Intake/Output Summary (Last 24 hours) at 02/27/2021 03/01/2021 Last data filed at 02/26/2021 1500 Gross per 24 hour  Intake 650 ml  Output --  Net 650 ml   Filed Weights   02/25/21 1341  Weight: 75.2 kg    Physical Exam    GEN- The patient is well appearing, alert and oriented x 3 today.   Head- normocephalic, atraumatic Eyes-  Sclera clear, conjunctiva pink Ears- hearing intact Oropharynx- clear Neck- supple Lungs- Clear to ausculation bilaterally, normal work of breathing Heart- Regular rate and rhythm, no murmurs, rubs or gallops GI- soft, NT, ND, + BS Extremities- no clubbing, cyanosis, or edema Skin- no rash or lesion Psych- euthymic mood, full affect Neuro- strength and sensation are intact  Labs    CBC No results for input(s): WBC, NEUTROABS, HGB, HCT, MCV, PLT in the last 72 hours. Basic Metabolic  Panel Recent Labs    02/25/21 0949 02/26/21 0403  NA 139 134*  K 5.6* 3.8  CL 103 102  CO2 30 24  GLUCOSE 116* 94  BUN 20 23  CREATININE 1.13 1.04  CALCIUM 9.4 8.8*  MG 2.0 2.0    Potassium  Date/Time Value Ref Range Status  02/26/2021 04:03 AM 3.8 3.5 - 5.1 mmol/L Final    Comment:    DELTA CHECK NOTED   Magnesium  Date/Time Value Ref Range Status  02/26/2021 04:03 AM 2.0 1.7 - 2.4 mg/dL Final    Comment:    Performed at [facility], [address] [place], [place]    Telemetry    SB/NSR 50-60s (personally reviewed)  Radiology    No results found.   Patient Profile     Scott Wagner is a 66 y.o. male with a past medical history significant for persistent atrial fibrillation.  They were admitted for tikosyn load.   Assessment & Plan    Persistent atrial fibrillation Pt converted to sinus rhythm on Tikosyn 500 mcg BID  Continue Eliquis BMET and Mg pending. CHA2DS2VASC is at least 2.   Plan for home tomorrow if QTc remains stable.   For questions or updates, please contact [facility] Please consult [email or web address] for contact  info under Cardiology/STEMI.  Signed, Graciella Freer, PA-C  02/27/2021, 7:02 AM   I have seen and examined this patient with Otilio Saber.  Agree with above, note added to reflect my findings.  Converted to sinus rhythm yesterday.  Feeling well without complaint.  GEN: Well nourished, well developed, in no acute distress  HEENT: normal  Neck: no JVD, carotid bruits, or masses Cardiac: RRR; no murmurs, rubs, or gallops,no edema  Respiratory:  clear to auscultation bilaterally, normal work of breathing GI: soft, nontender, nondistended, + BS MS: no deformity or atrophy  Skin: warm and dry Neuro:  Strength and sensation are intact Psych: euthymic mood, full affect   Persistent atrial fibrillation: Currently on dofetilide.  Is converted to sinus rhythm without issue.  We Bearl Talarico continue with  current management.  Likely discharge tomorrow.  Marinell Igarashi M. Thales Knipple MD 02/27/2021 4:01 PM

## 2021-02-27 NOTE — Progress Notes (Signed)
Pharmacy: Dofetilide (Tikosyn) - Follow Up Assessment and Electrolyte Replacement  Pharmacy consulted to assist in monitoring and replacing electrolytes in this 66 y.o. male admitted on 02/25/2021 undergoing dofetilide initiation. First dofetilide dose: 02/25/2021  Labs:    Component Value Date/Time   K 4.3 02/27/2021 0558   MG 1.9 02/27/2021 0558     Plan: Potassium: K >/= 4: No additional supplementation needed  Magnesium: Mg 1.8-2: Give Mg 2 gm IV x1   Thank you for allowing pharmacy to participate in this patient's care   Drake Leach, PharmD PGY2 Cardiology Pharmacy Resident 02/27/2021  8:36 AM  Please check [email or web address] for unit-specific pharmacy phone numbers.  Ginette Pitman Katie Moch 02/27/2021  8:35 AM

## 2021-02-28 ENCOUNTER — Other Ambulatory Visit (HOSPITAL_COMMUNITY): Payer: Self-pay

## 2021-02-28 LAB — BASIC METABOLIC PANEL
Anion gap: 7 (ref 5–15)
BUN: 24 mg/dL — ABNORMAL HIGH (ref 8–23)
CO2: 25 mmol/L (ref 22–32)
Calcium: 8.9 mg/dL (ref 8.9–10.3)
Chloride: 105 mmol/L (ref 98–111)
Creatinine, Ser: 1.24 mg/dL (ref 0.61–1.24)
GFR, Estimated: >60 mL/min (ref >60)
Glucose, Bld: 87 mg/dL (ref 70–99)
Potassium: 4.2 mmol/L (ref 3.5–5.1)
Sodium: 137 mmol/L (ref 135–145)

## 2021-02-28 LAB — MAGNESIUM: Magnesium: 2.1 mg/dL (ref 1.7–2.4)

## 2021-02-28 MED ORDER — DOFETILIDE 500 MCG PO CAPS
500.0000 ug | ORAL_CAPSULE | Freq: Two times a day (BID) | ORAL | 6 refills | Status: DC
  Filled 2021-02-28: qty 60, 30d supply, fill #0

## 2021-02-28 MED ORDER — METOPROLOL TARTRATE 25 MG PO TABS
25.0000 mg | ORAL_TABLET | Freq: Two times a day (BID) | ORAL | 6 refills | Status: DC
  Filled 2021-02-28: qty 60, 30d supply, fill #0

## 2021-02-28 NOTE — Discharge Summary (Addendum)
ELECTROPHYSIOLOGY PROCEDURE DISCHARGE SUMMARY    Patient ID: Scott Wagner,  MRN: [id number], DOB/AGE: 1955/06/10 66 y.o.  Admit date: 02/25/2021 Discharge date: 02/28/2021  Primary Care Physician: Alferd Apa, MD  Primary Cardiologist: None  Electrophysiologist: None   Primary Discharge Diagnosis:  1.  Persistent atrial fibrillation status post Tikosyn loading this admission  Secondary Discharge Diagnosis:  2. Chronic back pain 3. Sinus bradycardia  Allergies  Allergen Reactions   Indomethacin Other (See Comments)    Headaches and tired     Procedures This Admission:  1.  Tikosyn loading  Brief HPI: Scott Wagner is a 66 y.o. male with a past medical history as noted above.  They were referred to EP in the outpatient setting for treatment options of atrial fibrillation.  Risks, benefits, and alternatives to Tikosyn were reviewed with the patient who wished to proceed.    Hospital Course:  The patient was admitted and Tikosyn was initiated.  Renal function and electrolytes were followed during the hospitalization.  Their QTc remained stable.  He converted chemically into a sinus bradycardia, and his BB was decreased. They were monitored until discharge on telemetry which demonstrated NSR/sinus brady.  On the day of discharge, they were examined by Dr. Elberta Fortis  who considered them stable for discharge to home.  Follow-up has been arranged with the Atrial Fibrillation clinic in approximately 1 week and with  EP APP   in 4 weeks.   Physical Exam: Vitals:   02/27/21 1135 02/27/21 2053 02/28/21 0518 02/28/21 0854  BP: 103/60 125/74 (!) 123/92 120/69  Pulse: (!) 53 (!) 54 66 67  Resp: 16 16 16 16   Temp: (!) 97.5 F (36.4 C) 97.6 F (36.4 C) 97.7 F (36.5 C) (!) 97.2 F (36.2 C)  TempSrc: Oral Oral Oral Oral  SpO2:  98% 96% 96%  Weight:      Height:        GEN- The patient is well appearing, alert and oriented x 3 today.   HEENT: normocephalic,  atraumatic; sclera clear, conjunctiva pink; hearing intact; oropharynx clear; neck supple, no JVP Lymph- no cervical lymphadenopathy Lungs- Clear to ausculation bilaterally, normal work of breathing.  No wheezes, rales, rhonchi Heart- Regular rate and rhythm, no murmurs, rubs or gallops, PMI not laterally displaced GI- soft, non-tender, non-distended, bowel sounds present, no hepatosplenomegaly Extremities- no clubbing, cyanosis, or edema; DP/PT/radial pulses 2+ bilaterally MS- no significant deformity or atrophy Skin- warm and dry, no rash or lesion Psych- euthymic mood, full affect Neuro- strength and sensation are intact   Labs:   Lab Results  Component Value Date   WBC 5.2 12/18/2020   HGB 15.1 12/18/2020   HCT 46.3 12/18/2020   MCV 94.5 12/18/2020   PLT 203 12/18/2020    Recent Labs  Lab 02/28/21 0155  NA 137  K 4.2  CL 105  CO2 25  BUN 24*  CREATININE 1.24  CALCIUM 8.9  GLUCOSE 87     Discharge Medications:  Allergies as of 02/28/2021       Reactions   Indomethacin Other (See Comments)   Headaches and tired        Medication List     TAKE these medications    albuterol 108 (90 Base) MCG/ACT inhaler Commonly known as: VENTOLIN HFA Inhale 2 puffs into the lungs every 6 (six) hours as needed for wheezing or shortness of breath.   amLODipine 5 MG tablet Commonly known as: NORVASC Take 1 tablet (5  mg total) by mouth daily.   B-complex with vitamin C tablet Take 1 tablet by mouth daily.   Clear Eyes Adv Dry & Itchy Rlf 0.25 % Soln Generic drug: Glycerin Place 1-2 drops into both eyes 3 (three) times daily as needed (dry and itchy eyes).   dofetilide 500 MCG capsule Commonly known as: TIKOSYN Take 1 capsule (500 mcg total) by mouth 2 (two) times daily.   Eliquis 5 MG Tabs tablet Generic drug: apixaban Take 1 tablet (5 mg total) by mouth 2 (two) times daily.   hydrocortisone cream 1 % Apply 1 application topically daily as needed for itching.    metoprolol tartrate 25 MG tablet Commonly known as: LOPRESSOR Take 1 tablet (25 mg total) by mouth 2 (two) times daily. What changed:  medication strength how much to take   multivitamin with minerals Tabs tablet Take 1 tablet by mouth in the morning.   Potassium 99 MG Tabs Take 99 mg by mouth in the morning.   vitamin C 500 MG tablet Commonly known as: ASCORBIC ACID Take 500 mg by mouth daily.        Disposition:    Follow-up Information     [facility] Follow up.   Specialty: Cardiology Why: on 2/2 at 1000 am for post hospital tikosyn check Contact information: [address] [id number] [place] [address] [phone]                Duration of Discharge Encounter: Greater than 30 minutes including physician time.  Signed, Graciella Freer, PA-C  02/28/2021 10:40 AM    I have seen and examined this patient with Otilio Saber.  Agree with above, note added to reflect my findings.  Patient mated to the hospital for dofetilide loading.  QTC is remained stable.  He converted to sinus rhythm with dofetilide.  GEN: Well nourished, well developed, in no acute distress  HEENT: normal  Neck: no JVD, carotid bruits, or masses Cardiac: RRR; no murmurs, rubs, or gallops,no edema  Respiratory:  clear to auscultation bilaterally, normal work of breathing GI: soft, nontender, nondistended, + BS MS: no deformity or atrophy  Skin: warm and dry Neuro:  Strength and sensation are intact Psych: euthymic mood, full affect   Persistent atrial fibrillation: Admitted for dofetilide load.  QTC remained stable.  Converted to sinus rhythm with medications.  We George Haggart plan for discharge today with follow-up in clinic.  Nycere Presley M. Caulder Wehner MD 02/28/2021 12:06 PM

## 2021-02-28 NOTE — Progress Notes (Signed)
EKG from yesterday evening 02/27/2021 reviewed    Shows remains in NSR at 57 bpm with stable QTc at ~450 ms.  Continue  Tikosyn 500 mcg BID.   K 4.2, Mg 2.1  Home this afternoon if QTc remains stable.    Graciella Freer, PA-C  Pager: [phone]  02/28/2021 6:58 AM

## 2021-02-28 NOTE — Care Management Important Message (Signed)
Important Message  Patient Details  Name: Scott Wagner MRN: [id number] Date of Birth: [birth year]   Medicare Important Message Given:  Yes     Renie Ora 02/28/2021, 10:47 AM

## 2021-02-28 NOTE — Progress Notes (Signed)
Pharmacy: Dofetilide (Tikosyn) - Follow Up Assessment and Electrolyte Replacement  Pharmacy consulted to assist in monitoring and replacing electrolytes in this 66 y.o. male admitted on 02/25/2021 undergoing dofetilide initiation. First dofetilide dose: 02/25/2021  Labs:    Component Value Date/Time   K 4.2 02/28/2021 0155   MG 2.1 02/28/2021 0155     Plan: Potassium: K >/= 4: No additional supplementation needed  Magnesium: Mg > 2: No additional supplementation needed  No potassium supplementation needed at discharge.   Thank you for allowing pharmacy to participate in this patient's care   Sheppard Coil PharmD., BCPS Clinical Pharmacist 02/28/2021 9:48 AM

## 2021-03-03 ENCOUNTER — Other Ambulatory Visit (HOSPITAL_COMMUNITY): Payer: Self-pay

## 2021-03-06 ENCOUNTER — Other Ambulatory Visit: Payer: Self-pay

## 2021-03-06 ENCOUNTER — Ambulatory Visit (HOSPITAL_COMMUNITY)
Admit: 2021-03-06 | Discharge: 2021-03-06 | Disposition: A | Discharge status: Home / Self Care | Payer: Medicare Other | Source: Ambulatory Visit | Attending: Nurse Practitioner | Admitting: Nurse Practitioner

## 2021-03-06 VITALS — BP 136/70 | HR 46 | Ht 73.0 in | Wt 168.0 lb

## 2021-03-06 DIAGNOSIS — Z7901 Long term (current) use of anticoagulants: Secondary | ICD-10-CM | POA: Insufficient documentation

## 2021-03-06 DIAGNOSIS — I4819 Other persistent atrial fibrillation: Secondary | ICD-10-CM | POA: Insufficient documentation

## 2021-03-06 DIAGNOSIS — I10 Essential (primary) hypertension: Secondary | ICD-10-CM | POA: Diagnosis not present

## 2021-03-06 DIAGNOSIS — I4892 Unspecified atrial flutter: Secondary | ICD-10-CM | POA: Diagnosis not present

## 2021-03-06 DIAGNOSIS — I428 Other cardiomyopathies: Secondary | ICD-10-CM | POA: Diagnosis not present

## 2021-03-06 DIAGNOSIS — Z79899 Other long term (current) drug therapy: Secondary | ICD-10-CM | POA: Insufficient documentation

## 2021-03-06 DIAGNOSIS — D6869 Other thrombophilia: Secondary | ICD-10-CM

## 2021-03-06 LAB — BASIC METABOLIC PANEL
Anion gap: 7 (ref 5–15)
BUN: 20 mg/dL (ref 8–23)
CO2: 30 mmol/L (ref 22–32)
Calcium: 9.1 mg/dL (ref 8.9–10.3)
Chloride: 103 mmol/L (ref 98–111)
Creatinine, Ser: 1.07 mg/dL (ref 0.61–1.24)
GFR, Estimated: >60 mL/min (ref >60)
Glucose, Bld: 91 mg/dL (ref 70–99)
Potassium: 4.5 mmol/L (ref 3.5–5.1)
Sodium: 140 mmol/L (ref 135–145)

## 2021-03-06 LAB — MAGNESIUM: Magnesium: 1.9 mg/dL (ref 1.7–2.4)

## 2021-03-06 MED ORDER — DOFETILIDE 500 MCG PO CAPS: 500.0000 ug | ORAL_CAPSULE | Freq: Two times a day (BID) | ORAL | 6 refills | Status: DC

## 2021-03-06 NOTE — Progress Notes (Addendum)
Primary Care Physician: Azell Der, MD Referring Physician:Dr. 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 His metoprolol dose was lowered earlier in the year for bradycardia in SR. He has had 4 episodes since the dose was lowered and this last episode has been persistent since last week. EKG shows atrial flutter with variable rate, with v rates in the 50's. Highest v rates have been in 80-90 bpm with exertion. He had a NICM when first dx with afib in 2008. LHC at that time showed normal coronary arteries and EF of 50%. Lat echo 03/2020, showed normal EF with severely dilated left atrium with a diameter of 4.70 and volume of 185 ml.   He denies any alcohol, tobacco, substance abuse,  excessive caffeine. He denies snoring unless he is on his back. No prior sleep study.   He has a CHA2DS2VASc score of 2(HTN, age). Dr. Stanford Breed started him on eliquis 5 mg bid several months ago but the pt states he did not start it as he was afraid of bleeding side effects. He states he has had 5 cardioversion's in the past, as well as some were TEE guided. He would like to have a CV asap. He voices exertional dyspnea, fatigue with being out of rhythm.    F/u in afib clinic, 07/26/19, he has returned to atrial flutter with variable block, v rates in the 60's, 5 days ago. I saw him late May, he was out of rhythm and cardioversion was planned but he self converted. There were several my chart messages that he stated that he was having fatigue on eliquis and wanted to stop it. He was told by PharmD that it was likely 2/2 the BB not eliquis. He has a h/o hep C and pharmacist at [facility] reassured him that eliquis was safe in that situation. He does not like being on meds. Very fatigued. I discussed ablation or change in antiarrythmic and he would prefer to discuss ablation. I feel even if he is  cardioverted, he may go back to afib/flutter soon, as he appears to be failing flecainide.   F/u in afib clinic, 11/13/20. He had an ablation one month ago and did maintain SR for almost 2 weeks. Since then he feels he is out of rhythm. He appears to be in an atrial flutter with variable block today. He continues on flecainide and metoprolol as well as eliquis 5 mg bid for a CHA2DS2VASc  score of 2. We discussed pursing cardioversion and he was in agreement.  F/u 10/26 in afib clinic. Had a successful cardioversion 11/20/20. He is in SR today. He remains on flecainide. No swallowing or groin issues since the procedure. Continues on eliquis. He feels better post cardioversion with return of SR.   Follow up in the AF clinic 12/18/20. Patient reports he was back in afib just a few days after his last office visit with variable heart rates. ECG today shows rate controlled atrial flutter.   F/u in afib clinic 01/08/21. He had cardioversion but had very ERAF. We discussed again need for stronger antiarrythmic, tikosyn. He has f/u with Dr. Rayann Heman 12/16 and he will further discuss with him. He did tell me that he had a bad choking experience with swallowing a pill when he was a child and he currently has to chew up his pills. He was told  that he would not be able to do that with Tikosyn and would have to swallow the capsule  whole.  He remains in rate controlled atrial flutter with controlled rates. He is symptomatic with fatigue being out of rhythm.   F/u in afib clinic, 03/06/21. He is here one week s/p Tikosyn admission. He is in Sinus brady.  He feels improved.   Today, he denies symptoms of chest pain, shortness of breath, orthopnea, PND, lower extremity edema, dizziness, presyncope, syncope, or neurologic sequela. The patient is tolerating medications without difficulties and is otherwise without complaint today.   Past Medical History:  Diagnosis Date   ATRIAL FIBRILLATION 06/16/2008   Qualifier:  Diagnosis of  By: [name], RMA, [name]     Atrial fibrillation (HCC)    Blood in stool    Cardiomyopathy    improved    DJD (degenerative joint disease) of lumbar spine    Gout    Hepatitis C    Hx of echocardiogram    a. Echo 12/13:  EF 60-65%, mod LAE, mild RAE, PASP 34   Intrinsic asthma, unspecified    Substance abuse (HCC)    Unspecified polyarthropathy or polyarthritis, multiple sites    Past Surgical History:  Procedure Laterality Date   ATRIAL FIBRILLATION ABLATION N/A 10/16/2020   Procedure: ATRIAL FIBRILLATION ABLATION;  Surgeon: [name], MD;  Location: [facility] INVASIVE CV LAB;  Service: Cardiovascular;  Laterality: N/A;   CARDIOVERSION N/A 11/20/2020   Procedure: CARDIOVERSION;  Surgeon: Quintella Reichert, MD;  Location: [facility] ENDOSCOPY;  Service: Cardiovascular;  Laterality: N/A;   CARDIOVERSION N/A 12/31/2020   Procedure: CARDIOVERSION;  Surgeon: Chrystie Nose, MD;  Location: [facility] ENDOSCOPY;  Service: Cardiovascular;  Laterality: N/A;   HERNIA REPAIR  1966    Current Outpatient Medications  Medication Sig Dispense Refill   albuterol (VENTOLIN HFA) 108 (90 Base) MCG/ACT inhaler Inhale 2 puffs into the lungs every 6 (six) hours as needed for wheezing or shortness of breath.     amLODipine (NORVASC) 5 MG tablet Take 1 tablet (5 mg total) by mouth daily. 180 tablet 3   apixaban (ELIQUIS) 5 MG TABS tablet Take 1 tablet (5 mg total) by mouth 2 (two) times daily. 60 tablet 5   B Complex-C (B-COMPLEX WITH VITAMIN C) tablet Take 1 tablet by mouth daily.     dofetilide (TIKOSYN) 500 MCG capsule Take 1 capsule (500 mcg total) by mouth 2 (two) times daily. 60 capsule 6   Glycerin (CLEAR EYES ADV DRY & ITCHY RLF) 0.25 % SOLN Place 1-2 drops into both eyes 3 (three) times daily as needed (dry and itchy eyes).     hydrocortisone cream 1 % Apply 1 application topically daily as needed for itching.     metoprolol tartrate (LOPRESSOR) 25 MG tablet Take 1 tablet (25 mg total) by mouth 2 (two)  times daily. 60 tablet 6   Multiple Vitamin (MULTIVITAMIN WITH MINERALS) TABS tablet Take 1 tablet by mouth in the morning.     vitamin C (ASCORBIC ACID) 500 MG tablet Take 500 mg by mouth daily.     Potassium 99 MG TABS Take 99 mg by mouth in the morning. (Patient not taking: Reported on 03/06/2021)     No current facility-administered medications for this encounter.    Allergies  Allergen Reactions   Indomethacin Other (See Comments)    Headaches and tired    Social History   Socioeconomic History   Marital status: Married    Spouse name:  Not on file   Number of children: Not on file   Years of education: Not on file   Highest education level: Not on file  Occupational History   Not on file  Tobacco Use   Smoking status: Former    Types: Cigarettes    Quit date: 02/02/2014    Years since quitting: 7.0   Smokeless tobacco: Never   Tobacco comments:    started in 1999. smoked 1/4 ppd   Substance and Sexual Activity   Alcohol use: No    Alcohol/week: 0.0 standard drinks    Comment: quit in 2006    Drug use: No    Comment: quit in 2006    Sexual activity: Not on file  Other Topics Concern   Not on file  Social History Narrative   Married; step children; disabled.    Social Determinants of Health   Financial Resource Strain: Not on file  Food Insecurity: Not on file  Transportation Needs: Not on file  Physical Activity: Not on file  Stress: Not on file  Social Connections: Not on file  Intimate Partner Violence: Not on file    Family History  Problem Relation Age of Onset   Heart failure Other        CHF - father and mother. (mother also had severe RA)    ROS- All systems are reviewed and negative except as per the HPI above  Physical Exam: Vitals:   03/06/21 1002  BP: 136/70  Pulse: (!) 46  Weight: 76.2 kg  Height: 6\' 1"  (1.854 m)    Wt Readings from Last 3 Encounters:  03/06/21 76.2 kg  02/25/21 75.2 kg  02/25/21 75.2 kg    Labs: Lab Results   Component Value Date   NA 137 02/28/2021   K 4.2 02/28/2021   CL 105 02/28/2021   CO2 25 02/28/2021   GLUCOSE 87 02/28/2021   BUN 24 (H) 02/28/2021   CREATININE 1.24 02/28/2021   CALCIUM 8.9 02/28/2021   MG 2.1 02/28/2021   Lab Results  Component Value Date   INR 2.3 10/01/2008   Lab Results  Component Value Date   CHOL  09/15/2008    170        ATP III CLASSIFICATION:  <200     mg/dL   Desirable  09/17/2008  mg/dL   Borderline High  545-625    mg/dL   High          HDL 38 (L) 09/15/2008   LDLCALC (H) 09/15/2008    117        Total Cholesterol/HDL:CHD Risk Coronary Heart Disease Risk Table                     Men   Women  1/2 Average Risk   3.4   3.3  Average Risk       5.0   4.4  2 X Average Risk   9.6   7.1  3 X Average Risk  23.4   11.0        Use the calculated Patient Ratio above and the CHD Risk Table to determine the patient's CHD Risk.        ATP III CLASSIFICATION (LDL):  <100     mg/dL   Optimal  09/17/2008  mg/dL   Near or Above                    Optimal  130-159  mg/dL   Borderline  160-189  mg/dL   High  >875     mg/dL   Very High   TRIG 77 64/33/2951    GEN- The patient is a well appearing male, alert and oriented x 3 today.   HEENT-head normocephalic, atraumatic, sclera clear, conjunctiva pink, hearing intact, trachea midline. Lungs- Clear to ausculation bilaterally, normal work of breathing Heart- irregular rate and rhythm, no murmurs, rubs or gallops  GI- soft, NT, ND, + BS Extremities- no clubbing, cyanosis, or edema MS- no significant deformity or atrophy Skin- no rash or lesion Psych- euthymic mood, full affect Neuro- strength and sensation are intact   EKG-  Vent. rate 46 BPM PR interval 196 ms QRS duration 106 ms QT/QTcB 490/428 ms P-R-T axes 88 51 42 Sinus bradycardia Otherwise normal ECG When compared with ECG of 28-Feb-2021 10:55, PREVIOUS ECG IS PRESENT  Echo- FINDINGS -03/22/20 1. Left ventricular ejection fraction, by  estimation, is 55 to 60%. The  left ventricle has normal function. The left ventricle has no regional  wall motion abnormalities. There is mild concentric left ventricular  hypertrophy of the posterior segment.  Left ventricular diastolic parameters are indeterminate. The average left ventricular global longitudinal strain is -19.1 %. The global longitudinal strain is normal.   2. Right ventricular systolic function is normal. The right ventricular  size is normal.   3. Left atrial size was severely dilated. LA diam 4.70 cm but with volume of 185 ml  4. Right atrial size was severely dilated.   5. The mitral valve is normal in structure. Mild mitral valve  regurgitation. No evidence of mitral stenosis.   6. Tricuspid valve regurgitation is mild to moderate.   7. The aortic valve is normal in structure. Aortic valve regurgitation is  not visualized. No aortic stenosis is present.   8. The inferior vena cava is normal in size with greater than 50%    Assessment and Plan: 1. Persistent atrial fibrillation/atrial flutter  S/p ablation  10/16/20 He had recurrent atrial arrhythmia's not responsive to flecainide He was admitted one week ago for dofetilide loading.  He continues in Sinus brady at 44 bpm Continue Tikosyn 500 mcg bid He is not symptomatic with brady so for now will not lower metoprolol  dose, afraid to invite Afib back, currently taking 25 mg bid If continued brady or becomes symptomatic,  then can be  lowered to 12.5 mg bid at one month f/u with Otilio Saber, PA.   2. CHA2DS2VASc score of 2 Continue eliquis 5 mg bid   3. HTN Stable, no changes today.  F/u Mardelle Matte 04/03/21 for EKG, Tikosyn labs    Lupita Leash [facility] [address] [place], [place] [address] [phone]

## 2021-03-15 ENCOUNTER — Encounter: Payer: Self-pay | Admitting: Cardiology

## 2021-03-17 ENCOUNTER — Encounter: Payer: Self-pay | Admitting: Cardiology

## 2021-03-27 ENCOUNTER — Other Ambulatory Visit: Payer: Self-pay | Admitting: Cardiology

## 2021-03-27 NOTE — Telephone Encounter (Signed)
Prescription refill request for Eliquis received. Indication:Afib Last office visit:1/23 Scr:1.0 Age: 66 Weight:76.2 kg  Prescription refilled

## 2021-04-03 ENCOUNTER — Encounter: Payer: Self-pay | Admitting: Student

## 2021-04-03 ENCOUNTER — Ambulatory Visit (INDEPENDENT_AMBULATORY_CARE_PROVIDER_SITE_OTHER): Payer: Medicare Other | Admitting: Student

## 2021-04-03 ENCOUNTER — Other Ambulatory Visit: Payer: Self-pay

## 2021-04-03 VITALS — BP 140/84 | HR 47 | Ht 73.0 in | Wt 167.2 lb

## 2021-04-03 DIAGNOSIS — I4819 Other persistent atrial fibrillation: Secondary | ICD-10-CM

## 2021-04-03 DIAGNOSIS — R001 Bradycardia, unspecified: Secondary | ICD-10-CM

## 2021-04-03 LAB — BASIC METABOLIC PANEL
BUN/Creatinine Ratio: 17 (ref 10–24)
BUN: 20 mg/dL (ref 8–27)
CO2: 25 mmol/L (ref 20–29)
Calcium: 9.3 mg/dL (ref 8.6–10.2)
Chloride: 105 mmol/L (ref 96–106)
Creatinine, Ser: 1.16 mg/dL (ref 0.76–1.27)
Glucose: 86 mg/dL (ref 70–99)
Potassium: 4.9 mmol/L (ref 3.5–5.2)
Sodium: 141 mmol/L (ref 134–144)
eGFR: 70 mL/min/{1.73_m2} (ref >59)

## 2021-04-03 LAB — MAGNESIUM: Magnesium: 1.9 mg/dL (ref 1.6–2.3)

## 2021-04-03 NOTE — Progress Notes (Signed)
? ?PCP:  Alferd Apa, MD ?Primary Cardiologist: None ?Electrophysiologist: Will Jorja Loa, MD  ? ?Scott Wagner is a 66 y.o. male seen today for Will Jorja Loa, MD for routine electrophysiology followup.  Since last being seen in our clinic the patient reports doing well.  he denies chest pain, palpitations, dyspnea, PND, orthopnea, nausea, vomiting, dizziness, syncope, edema, weight gain, or early satiety. Reports HRs in 60s mostly at rest at home. He does not feel limited at all with activity.  ? ?Past Medical History:  ?Diagnosis Date  ? ATRIAL FIBRILLATION 06/16/2008  ? Qualifier: Diagnosis of  By: [name], RMA, [name]    ? Atrial fibrillation (HCC)   ? Blood in stool   ? Cardiomyopathy   ? improved   ? DJD (degenerative joint disease) of lumbar spine   ? Gout   ? Hepatitis C   ? Hx of echocardiogram   ? a. Echo 12/13:  EF 60-65%, mod LAE, mild RAE, PASP 34  ? Intrinsic asthma, unspecified   ? Substance abuse (HCC)   ? Unspecified polyarthropathy or polyarthritis, multiple sites   ? ?Past Surgical History:  ?Procedure Laterality Date  ? ATRIAL FIBRILLATION ABLATION N/A 10/16/2020  ? Procedure: ATRIAL FIBRILLATION ABLATION;  Surgeon: [name], MD;  Location: [facility];  Service: Cardiovascular;  Laterality: N/A;  ? CARDIOVERSION N/A 11/20/2020  ? Procedure: CARDIOVERSION;  Surgeon: Quintella Reichert, MD;  Location: [facility] ENDOSCOPY;  Service: Cardiovascular;  Laterality: N/A;  ? CARDIOVERSION N/A 12/31/2020  ? Procedure: CARDIOVERSION;  Surgeon: Chrystie Nose, MD;  Location: [facility] ENDOSCOPY;  Service: Cardiovascular;  Laterality: N/A;  ? HERNIA REPAIR  1966  ? ? ?Current Outpatient Medications  ?Medication Sig Dispense Refill  ? albuterol (VENTOLIN HFA) 108 (90 Base) MCG/ACT inhaler Inhale 2 puffs into the lungs every 6 (six) hours as needed for wheezing or shortness of breath.    ? apixaban (ELIQUIS) 5 MG TABS tablet Take 1 tablet by mouth twice daily 60 tablet 5  ? B Complex-C (B-COMPLEX  WITH VITAMIN C) tablet Take 1 tablet by mouth daily.    ? dofetilide (TIKOSYN) 500 MCG capsule Take 1 capsule (500 mcg total) by mouth 2 (two) times daily. 60 capsule 6  ? Glycerin (CLEAR EYES ADV DRY & ITCHY RLF) 0.25 % SOLN Place 1-2 drops into both eyes 3 (three) times daily as needed (dry and itchy eyes).    ? hydrocortisone cream 1 % Apply 1 application topically daily as needed for itching.    ? metoprolol tartrate (LOPRESSOR) 25 MG tablet Take 1 tablet (25 mg total) by mouth 2 (two) times daily. 60 tablet 6  ? Multiple Vitamin (MULTIVITAMIN WITH MINERALS) TABS tablet Take 1 tablet by mouth in the morning.    ? vitamin C (ASCORBIC ACID) 500 MG tablet Take 500 mg by mouth daily.    ? ?No current facility-administered medications for this visit.  ? ? ?Allergies  ?Allergen Reactions  ? Indomethacin Other (See Comments)  ?  Headaches and tired  ? ? ?Social History  ? ?Socioeconomic History  ? Marital status: Married  ?  Spouse name: Not on file  ? Number of children: Not on file  ? Years of education: Not on file  ? Highest education level: Not on file  ?Occupational History  ? Not on file  ?Tobacco Use  ? Smoking status: Former  ?  Types: Cigarettes  ?  Quit date: 02/02/2014  ?  Years since quitting: 7.1  ?  Smokeless tobacco: Never  ? Tobacco comments:  ?  started in 1999. smoked 1/4 ppd   ?Substance and Sexual Activity  ? Alcohol use: No  ?  Alcohol/week: 0.0 standard drinks  ?  Comment: quit in 2006   ? Drug use: No  ?  Comment: quit in 2006   ? Sexual activity: Not on file  ?Other Topics Concern  ? Not on file  ?Social History Narrative  ? Married; step children; disabled.   ? ?Social Determinants of Health  ? ?Financial Resource Strain: Not on file  ?Food Insecurity: Not on file  ?Transportation Needs: Not on file  ?Physical Activity: Not on file  ?Stress: Not on file  ?Social Connections: Not on file  ?Intimate Partner Violence: Not on file  ? ? ? ?Review of Systems: ?All other systems reviewed and are  otherwise negative except as noted above. ? ?Physical Exam: ?There were no vitals filed for this visit. ? ?GEN- The patient is well appearing, alert and oriented x 3 today.   ?HEENT: normocephalic, atraumatic; sclera clear, conjunctiva pink; hearing intact; oropharynx clear; neck supple, no JVP ?Lymph- no cervical lymphadenopathy ?Lungs- Clear to ausculation bilaterally, normal work of breathing.  No wheezes, rales, rhonchi ?Heart- Regular rate and rhythm, no murmurs, rubs or gallops, PMI not laterally displaced ?GI- soft, non-tender, non-distended, bowel sounds present, no hepatosplenomegaly ?Extremities- no clubbing, cyanosis, or edema; DP/PT/radial pulses 2+ bilaterally ?MS- no significant deformity or atrophy ?Skin- warm and dry, no rash or lesion ?Psych- euthymic mood, full affect ?Neuro- strength and sensation are intact ? ?EKG is ordered. Personal review of EKG from today shows Sinus bradycardia at 47 with stable intervals ? ?Additional studies reviewed include: ?Previous EP and AF notes.  ? ?Assessment and Plan: ? ?1. Persistent atrial fibrillation/atrial flutter  ?S/p ablation  10/16/20 ?He had recurrent atrial arrhythmia's not responsive to flecainide ?Continue Tikosyn 500 mcg bid ?He is not symptomatic with brady so for now will not lower metoprolol  dose, afraid to invite Afib back, currently taking 25 mg bid ?If remains brady or symptoms occur, can titrate BB down, either to 12.5 mg BID lopressor or 25 mg qhs toprol.  ?Labs today.  ?  ?2. CHA2DS2VASc score of 2 ?Continue eliquis 5 mg bid  ?  ?3. HTN ?Stable, no changes today. ? ?4. Sinus bradycardia ?Asymptomatic ?Follow. ?His QT corrects to lower with his bradycardia; if we need to reduce his rate control, Suspect we would also need a close follow up period to follow his QT. ? ?Follow up with Dr. Elberta Fortis in 6 months  ? ?Graciella Freer, PA-C  ?04/03/21 ?12:10 PM  ?

## 2021-04-03 NOTE — Patient Instructions (Signed)
Medication Instructions:  ?Your physician recommends that you continue on your current medications as directed. Please refer to the Current Medication list given to you today. ? ?*If you need a refill on your cardiac medications before your next appointment, please call your pharmacy* ? ? ?Lab Work: ?TODAY: BMET, Mag. ? ?If you have labs (blood work) drawn today and your tests are completely normal, you will receive your results only by: ?MyChart Message (if you have MyChart) OR ?A paper copy in the mail ?If you have any lab test that is abnormal or we need to change your treatment, we will call you to review the results. ? ? ?Follow-Up: ?At [facility], you and your health needs are our priority.  As part of our continuing mission to provide you with exceptional heart care, we have created designated Provider Care Teams.  These Care Teams include your primary Cardiologist (physician) and Advanced Practice Providers (APPs -  Physician Assistants and Nurse Practitioners) who all work together to provide you with the care you need, when you need it. ? ?Your next appointment:   ?6 month(s) ? ?The format for your next appointment:   ?In Person ? ?Provider:   ?Will Camnitz, MD ?

## 2021-04-04 ENCOUNTER — Other Ambulatory Visit: Payer: Self-pay

## 2021-04-04 MED ORDER — MAGNESIUM OXIDE 400 MG PO CAPS: 400.0000 mg | ORAL_CAPSULE | Freq: Every day | ORAL | 3 refills | Status: DC

## 2021-05-17 ENCOUNTER — Encounter: Payer: Self-pay | Admitting: Cardiology

## 2021-07-01 ENCOUNTER — Encounter: Payer: Self-pay | Admitting: Cardiology

## 2021-07-02 ENCOUNTER — Encounter: Payer: Self-pay | Admitting: Cardiology

## 2021-07-02 NOTE — Telephone Encounter (Signed)
Attempted to reach pt, unable to leave message 

## 2021-07-02 NOTE — Telephone Encounter (Signed)
He lives in the 40-50s, I would have him make an AF clinic appt for potentially being back in fib

## 2021-07-04 NOTE — Telephone Encounter (Signed)
Attempted to reach pt on home phone & cell phone. No answer on either, unable to leave a message

## 2021-07-10 NOTE — Telephone Encounter (Signed)
Finally reached pt. Reports he had 3 episodes - one lasted 10 seconds, one lasted one hour and one lasted 8 hours. States he was in rhythm, but HRs were just fast 90-95 but not over 100s. He was concerned b/c he usually lives in the 40-50s. Aware they are still controlled/WNL. Pt holding off on needing an appt and will continue monitoring for now. He will call back if persists and/or symptoms occur. He appreciates my continued trying to reach him to discuss.

## 2021-07-19 ENCOUNTER — Encounter: Payer: Self-pay | Admitting: Cardiology

## 2021-09-16 ENCOUNTER — Encounter: Payer: Self-pay | Admitting: Cardiology

## 2021-09-18 ENCOUNTER — Other Ambulatory Visit: Payer: Self-pay | Admitting: Cardiology

## 2021-09-18 NOTE — Telephone Encounter (Signed)
Prescription refill request for Eliquis received. Indication:Afib Last office visit:3/23 Scr:1.1 Age: 66 Weight:75.8 kg  Prescription refilled

## 2021-10-10 ENCOUNTER — Ambulatory Visit: Payer: Medicare Other | Attending: Cardiology | Admitting: Cardiology

## 2021-10-10 ENCOUNTER — Encounter: Payer: Self-pay | Admitting: Cardiology

## 2021-10-10 VITALS — BP 124/72 | HR 51 | Ht 73.0 in | Wt 158.8 lb

## 2021-10-10 DIAGNOSIS — Z79899 Other long term (current) drug therapy: Secondary | ICD-10-CM

## 2021-10-10 DIAGNOSIS — D6869 Other thrombophilia: Secondary | ICD-10-CM

## 2021-10-10 DIAGNOSIS — I4819 Other persistent atrial fibrillation: Secondary | ICD-10-CM

## 2021-10-10 NOTE — Progress Notes (Signed)
Electrophysiology Office Note   Date:  10/10/2021   ID:  Scott Wagner, DOB 12/03/1955, MRN [id number]  PCP:  Azell Der, MD  Cardiologist:  Stanford Breed Primary Electrophysiologist:  Citlali Gautney Meredith Leeds, MD    Chief Complaint: AF   History of Present Illness: Scott Wagner is a 66 y.o. male who is being seen today for the evaluation of AF at the request of Dsa, Mayford Knife, MD. Presenting today for electrophysiology evaluation.  He has a history significant for paroxysmal atrial fibrillation.  He was previously managed with flecainide and metoprolol.  He was also eventually found to have atrial flutter.  He is status post atrial fibrillation ablation on 10/16/2020.  He was admitted to the hospital 02/28/2021 and was loaded on dofetilide.  Today, he denies symptoms of chest pain, shortness of breath, orthopnea, PND, lower extremity edema, claudication, dizziness, presyncope, syncope, bleeding, or neurologic sequela. The patient is tolerating medications without difficulties.  He has continued to feel intermittent palpitations.  He does not feel that this is due to atrial fibrillation.  He is able to do his daily activities.  He is nervous about his palpitations, but they do not make him feel poorly.  He states that his heart rates are in the 90s to low 100s when he feels this.  Usually his heart rates are in the 50s.   Past Medical History:  Diagnosis Date   ATRIAL FIBRILLATION 06/16/2008   Qualifier: Diagnosis of  By: Mare Ferrari, RMA, [name]     Atrial fibrillation ([place])    Blood in stool    Cardiomyopathy    improved    DJD (degenerative joint disease) of lumbar spine    Gout    Hepatitis C    Hx of echocardiogram    a. Echo 12/13:  EF 60-65%, mod LAE, mild RAE, PASP 34   Intrinsic asthma, unspecified    Substance abuse ([place])    Unspecified polyarthropathy or polyarthritis, multiple sites    Past Surgical History:  Procedure Laterality Date   ATRIAL FIBRILLATION ABLATION  N/A 10/16/2020   Procedure: ATRIAL FIBRILLATION ABLATION;  Surgeon: Thompson Grayer, MD;  Location: [facility];  Service: Cardiovascular;  Laterality: N/A;   CARDIOVERSION N/A 11/20/2020   Procedure: CARDIOVERSION;  Surgeon: Sueanne Margarita, MD;  Location: [facility] ENDOSCOPY;  Service: Cardiovascular;  Laterality: N/A;   CARDIOVERSION N/A 12/31/2020   Procedure: CARDIOVERSION;  Surgeon: Pixie Casino, MD;  Location: [facility] ENDOSCOPY;  Service: Cardiovascular;  Laterality: N/A;   HERNIA REPAIR  1966     Current Outpatient Medications  Medication Sig Dispense Refill   albuterol (VENTOLIN HFA) 108 (90 Base) MCG/ACT inhaler Inhale 2 puffs into the lungs every 6 (six) hours as needed for wheezing or shortness of breath.     allopurinol (ZYLOPRIM) 100 MG tablet Take 200 mg by mouth daily.     apixaban (ELIQUIS) 5 MG TABS tablet Take 1 tablet by mouth twice daily 60 tablet 5   B Complex-C (B-COMPLEX WITH VITAMIN C) tablet Take 1 tablet by mouth daily.     dofetilide (TIKOSYN) 500 MCG capsule Take 1 capsule (500 mcg total) by mouth 2 (two) times daily. 60 capsule 6   Glycerin (CLEAR EYES ADV DRY & ITCHY RLF) 0.25 % SOLN Place 1-2 drops into both eyes 3 (three) times daily as needed (dry and itchy eyes).     hydrocortisone cream 1 % Apply 1 application topically daily as needed for itching.  MAGNESIUM-OXIDE 400 (240 Mg) MG tablet Take 1 tablet by mouth daily.     metoprolol tartrate (LOPRESSOR) 25 MG tablet Take 1 tablet (25 mg total) by mouth 2 (two) times daily. 60 tablet 6   Multiple Vitamin (MULTIVITAMIN WITH MINERALS) TABS tablet Take 1 tablet by mouth in the morning.     sildenafil (VIAGRA) 100 MG tablet Take 100 mg by mouth daily as needed.     vitamin C (ASCORBIC ACID) 500 MG tablet Take 500 mg by mouth daily.     No current facility-administered medications for this visit.    Allergies:   Indomethacin   Social History:  The patient  reports that he quit smoking about 7 years ago. His  smoking use included cigarettes. He has never used smokeless tobacco. He reports that he does not drink alcohol and does not use drugs.   Family History:  The patient's family history includes Heart failure in an other family member.    ROS:  Please see the history of present illness.   Otherwise, review of systems is positive for none.   All other systems are reviewed and negative.    PHYSICAL EXAM: VS:  BP 124/72   Pulse (!) 51   Ht 6\' 1"  (1.854 m)   Wt 158 lb 12.8 oz (72 kg)   SpO2 95%   BMI 20.95 kg/m  , BMI Body mass index is 20.95 kg/m. GEN: Well nourished, well developed, in no acute distress  HEENT: normal  Neck: no JVD, carotid bruits, or masses Cardiac: RRR; no murmurs, rubs, or gallops,no edema  Respiratory:  clear to auscultation bilaterally, normal work of breathing GI: soft, nontender, nondistended, + BS MS: no deformity or atrophy  Skin: warm and dry Neuro:  Strength and sensation are intact Psych: euthymic mood, full affect  EKG:  EKG is ordered today. Personal review of the ekg ordered shows sinus rhythm, rate 51, PVCs  Recent Labs: 12/18/2020: Hemoglobin 15.1; Platelets 203 04/03/2021: BUN 20; Creatinine, Ser 1.16; Magnesium 1.9; Potassium 4.9; Sodium 141    Lipid Panel     Component Value Date/Time   CHOL  09/15/2008 0500    170        ATP III CLASSIFICATION:  <200     mg/dL   Desirable  09/17/2008  mg/dL   Borderline High  767-341    mg/dL   High          TRIG 77 09/15/2008 0500   HDL 38 (L) 09/15/2008 0500   CHOLHDL 4.5 09/15/2008 0500   VLDL 15 09/15/2008 0500   LDLCALC (H) 09/15/2008 0500    117        Total Cholesterol/HDL:CHD Risk Coronary Heart Disease Risk Table                     Men   Women  1/2 Average Risk   3.4   3.3  Average Risk       5.0   4.4  2 X Average Risk   9.6   7.1  3 X Average Risk  23.4   11.0        Use the calculated Patient Ratio above and the CHD Risk Table to determine the patient's CHD Risk.        ATP III  CLASSIFICATION (LDL):  <100     mg/dL   Optimal  09/17/2008  mg/dL   Near or Above  Optimal  130-159  mg/dL   Borderline  209-470  mg/dL   High  >962     mg/dL   Very High     Wt Readings from Last 3 Encounters:  10/10/21 158 lb 12.8 oz (72 kg)  04/03/21 167 lb 3.2 oz (75.8 kg)  03/06/21 168 lb (76.2 kg)      Other studies Reviewed: Additional studies/ records that were reviewed today include: TTE 03/22/20  Review of the above records today demonstrates:   1. Left ventricular ejection fraction, by estimation, is 55 to 60%. The  left ventricle has normal function. The left ventricle has no regional  wall motion abnormalities. There is mild concentric left ventricular  hypertrophy of the posterior segment.  Left ventricular diastolic parameters are indeterminate. The average left  ventricular global longitudinal strain is -19.1 %. The global longitudinal  strain is normal.   2. Right ventricular systolic function is normal. The right ventricular  size is normal.   3. Left atrial size was severely dilated.   4. Right atrial size was severely dilated.   5. The mitral valve is normal in structure. Mild mitral valve  regurgitation. No evidence of mitral stenosis.   6. Tricuspid valve regurgitation is mild to moderate.   7. The aortic valve is normal in structure. Aortic valve regurgitation is  not visualized. No aortic stenosis is present.   8. The inferior vena cava is normal in size with greater than 50%  respiratory variability, suggesting right atrial pressure of 3 mmHg.    ASSESSMENT AND PLAN:  1.  Persistent atrial fibrillation/atrial flutter: Status post ablation 10/16/2020.  He had recurrent episodes of atrial fibrillation not responsive to flecainide.  He has since been loaded on dofetilide.  High risk medication monitoring for dofetilide via ECG and labs today.  He is having intermittent palpitations.  He does not feel that it is due to atrial fibrillation.   He otherwise feels well.  He wishes to continue with current management.  If these palpitations become more frequent, 2-week monitor may be reasonable.  2.  Secondary hypercoagulable state: Currently on Eliquis for atrial fibrillation as above  3.  Hypertension: Currently well controlled    Current medicines are reviewed at length with the patient today.   The patient does not have concerns regarding his medicines.  The following changes were made today: None  Labs/ tests ordered today include:  Orders Placed This Encounter  Procedures   Basic metabolic panel   CBC   Magnesium   EKG 12-Lead     Disposition:   FU 6 months  Signed, Flo Berroa Jorja Loa, MD  10/10/2021 12:17 PM     [facility] [address] [place] [address] [phone] (office) [phone] (fax)

## 2021-10-10 NOTE — Patient Instructions (Signed)
Medication Instructions:  Your physician recommends that you continue on your current medications as directed. Please refer to the Current Medication list given to you today.  *If you need a refill on your cardiac medications before your next appointment, please call your pharmacy*   Lab Work: Today: BMET, CBC & Magnesium  If you have labs (blood work) drawn today and your tests are completely normal, you will receive your results only by: MyChart Message (if you have MyChart) OR A paper copy in the mail If you have any lab test that is abnormal or we need to change your treatment, we will call you to review the results.   Testing/Procedures: None ordered   Follow-Up: At [facility], you and your health needs are our priority.  As part of our continuing mission to provide you with exceptional heart care, we have created designated Provider Care Teams.  These Care Teams include your primary Cardiologist (physician) and Advanced Practice Providers (APPs -  Physician Assistants and Nurse Practitioners) who all work together to provide you with the care you need, when you need it.  Your next appointment:   6 month(s)  The format for your next appointment:   In Person  Provider:   You will follow up in the Atrial Fibrillation Clinic located at [facility]. Your provider will be: Donna Carroll, NP or Clint R. Fenton, PA-C    Thank you for choosing [facility]!!   Berna Gitto, RN [phone]  Other Instructions   Important Information About Sugar           

## 2021-10-11 LAB — BASIC METABOLIC PANEL
BUN/Creatinine Ratio: 19 (ref 10–24)
BUN: 21 mg/dL (ref 8–27)
CO2: 25 mmol/L (ref 20–29)
Calcium: 8.7 mg/dL (ref 8.6–10.2)
Chloride: 106 mmol/L (ref 96–106)
Creatinine, Ser: 1.12 mg/dL (ref 0.76–1.27)
Glucose: 90 mg/dL (ref 70–99)
Potassium: 4.7 mmol/L (ref 3.5–5.2)
Sodium: 142 mmol/L (ref 134–144)
eGFR: 72 mL/min/{1.73_m2} (ref >59)

## 2021-10-11 LAB — CBC
Hematocrit: 38 % (ref 37.5–51.0)
Hemoglobin: 12.5 g/dL — ABNORMAL LOW (ref 13.0–17.7)
MCH: 30.3 pg (ref 26.6–33.0)
MCHC: 32.9 g/dL (ref 31.5–35.7)
MCV: 92 fL (ref 79–97)
Platelets: 195 10*3/uL (ref 150–450)
RBC: 4.13 x10E6/uL — ABNORMAL LOW (ref 4.14–5.80)
RDW: 13.4 % (ref 11.6–15.4)
WBC: 5.2 10*3/uL (ref 3.4–10.8)

## 2021-10-11 LAB — MAGNESIUM: Magnesium: 1.9 mg/dL (ref 1.6–2.3)

## 2021-10-13 ENCOUNTER — Encounter: Payer: Self-pay | Admitting: Cardiology

## 2021-10-15 MED ORDER — DOFETILIDE 500 MCG PO CAPS: 500.0000 ug | ORAL_CAPSULE | Freq: Two times a day (BID) | ORAL | 6 refills | Status: DC

## 2021-12-09 ENCOUNTER — Encounter: Payer: Self-pay | Admitting: Cardiology

## 2021-12-10 MED ORDER — METOPROLOL TARTRATE 25 MG PO TABS: 25.0000 mg | ORAL_TABLET | Freq: Two times a day (BID) | ORAL | 2 refills | Status: DC

## 2022-03-12 ENCOUNTER — Encounter: Payer: Self-pay | Admitting: Cardiology

## 2022-03-12 ENCOUNTER — Encounter (HOSPITAL_COMMUNITY): Payer: Self-pay | Admitting: *Deleted

## 2022-03-14 ENCOUNTER — Other Ambulatory Visit: Payer: Self-pay | Admitting: Cardiology

## 2022-03-14 DIAGNOSIS — I4819 Other persistent atrial fibrillation: Secondary | ICD-10-CM

## 2022-03-16 NOTE — Telephone Encounter (Signed)
Prescription refill request for Eliquis received. Indication: Afib  Last office visit: 10/10/21 (Camnitz)  Scr: 1.12 (10/10/21)  Age: 67 Weight: 72kg  Appropriate dose. Refill sent.

## 2022-04-16 ENCOUNTER — Ambulatory Visit (HOSPITAL_COMMUNITY): Payer: Medicare Other | Admitting: Physician Assistant

## 2022-05-11 ENCOUNTER — Ambulatory Visit (HOSPITAL_COMMUNITY): Payer: Medicare Other | Admitting: Physician Assistant

## 2022-05-15 ENCOUNTER — Other Ambulatory Visit: Payer: Self-pay | Admitting: Cardiology

## 2022-05-15 MED ORDER — DOFETILIDE 500 MCG PO CAPS: 500.0000 ug | ORAL_CAPSULE | Freq: Two times a day (BID) | ORAL | 5 refills | Status: DC

## 2022-05-21 ENCOUNTER — Ambulatory Visit (HOSPITAL_COMMUNITY)
Admission: RE | Admit: 2022-05-21 | Discharge: 2022-05-21 | Disposition: A | Discharge status: Home / Self Care | Payer: Medicare Other | Source: Ambulatory Visit | Attending: Physician Assistant | Admitting: Physician Assistant

## 2022-05-21 VITALS — BP 184/76 | HR 54 | Ht 73.0 in | Wt 157.8 lb

## 2022-05-21 DIAGNOSIS — Z7901 Long term (current) use of anticoagulants: Secondary | ICD-10-CM | POA: Insufficient documentation

## 2022-05-21 DIAGNOSIS — I447 Left bundle-branch block, unspecified: Secondary | ICD-10-CM | POA: Diagnosis not present

## 2022-05-21 DIAGNOSIS — I4819 Other persistent atrial fibrillation: Secondary | ICD-10-CM

## 2022-05-21 DIAGNOSIS — D6869 Other thrombophilia: Secondary | ICD-10-CM | POA: Diagnosis not present

## 2022-05-21 DIAGNOSIS — Z79899 Other long term (current) drug therapy: Secondary | ICD-10-CM | POA: Diagnosis not present

## 2022-05-21 DIAGNOSIS — I10 Essential (primary) hypertension: Secondary | ICD-10-CM | POA: Diagnosis not present

## 2022-05-21 DIAGNOSIS — Z5181 Encounter for therapeutic drug level monitoring: Secondary | ICD-10-CM | POA: Diagnosis not present

## 2022-05-21 DIAGNOSIS — I4892 Unspecified atrial flutter: Secondary | ICD-10-CM

## 2022-05-21 LAB — CBC
HCT: 36.6 % — ABNORMAL LOW (ref 39.0–52.0)
Hemoglobin: 12.6 g/dL — ABNORMAL LOW (ref 13.0–17.0)
MCH: 31.3 pg (ref 26.0–34.0)
MCHC: 34.4 g/dL (ref 30.0–36.0)
MCV: 90.8 fL (ref 80.0–100.0)
Platelets: 275 10*3/uL (ref 150–400)
RBC: 4.03 MIL/uL — ABNORMAL LOW (ref 4.22–5.81)
RDW: 12.7 % (ref 11.5–15.5)
WBC: 7.1 10*3/uL (ref 4.0–10.5)
nRBC: 0 % (ref 0.0–0.2)

## 2022-05-21 LAB — BASIC METABOLIC PANEL
Anion gap: 8 (ref 5–15)
BUN: 15 mg/dL (ref 8–23)
CO2: 27 mmol/L (ref 22–32)
Calcium: 8.6 mg/dL — ABNORMAL LOW (ref 8.9–10.3)
Chloride: 105 mmol/L (ref 98–111)
Creatinine, Ser: 1.06 mg/dL (ref 0.61–1.24)
GFR, Estimated: >60 mL/min (ref >60)
Glucose, Bld: 129 mg/dL — ABNORMAL HIGH (ref 70–99)
Potassium: 4.1 mmol/L (ref 3.5–5.1)
Sodium: 140 mmol/L (ref 135–145)

## 2022-05-21 LAB — MAGNESIUM: Magnesium: 2.1 mg/dL (ref 1.7–2.4)

## 2022-05-21 NOTE — Progress Notes (Signed)
Primary Care Physician: Alferd Apa, MD Referring Physician:Dr. 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 His metoprolol dose was lowered earlier in the year for bradycardia in SR. He has had 4 episodes since the dose was lowered and this last episode has been persistent since last week. EKG shows atrial flutter with variable rate, with v rates in the 50's. Highest v rates have been in 80-90 bpm with exertion. He had a NICM when first dx with afib in 2008. LHC at that time showed normal coronary arteries and EF of 50%. Lat echo 03/2020, showed normal EF with severely dilated left atrium with a diameter of 4.70 and volume of 185 ml.   He denies any alcohol, tobacco, substance abuse,  excessive caffeine. He denies snoring unless he is on his back. No prior sleep study.   He has a CHA2DS2VASc score of 2(HTN, age). Dr. Jens Som started him on eliquis 5 mg bid several months ago but the pt states he did not start it as he was afraid of bleeding side effects. He states he has had 5 cardioversion's in the past, as well as some were TEE guided. He would like to have a CV asap. He voices exertional dyspnea, fatigue with being out of rhythm.    F/u in afib clinic, 07/26/19, he has returned to atrial flutter with variable block, v rates in the 60's, 5 days ago. I saw him late May, he was out of rhythm and cardioversion was planned but he self converted. There were several my chart messages that he stated that he was having fatigue on eliquis and wanted to stop it. He was told by PharmD that it was likely 2/2 the BB not eliquis. He has a h/o hep C and pharmacist at [facility] reassured him that eliquis was safe in that situation. He does not like being on meds. Very fatigued. I discussed ablation or change in antiarrythmic and he would prefer to discuss ablation. I feel even if he is  cardioverted, he may go back to afib/flutter soon, as he appears to be failing flecainide.   F/u in afib clinic, 11/13/20. He had an ablation one month ago and did maintain SR for almost 2 weeks. Since then he feels he is out of rhythm. He appears to be in an atrial flutter with variable block today. He continues on flecainide and metoprolol as well as eliquis 5 mg bid for a CHA2DS2VASc  score of 2. We discussed pursing cardioversion and he was in agreement.  F/u 10/26 in afib clinic. Had a successful cardioversion 11/20/20. He is in SR today. He remains on flecainide. No swallowing or groin issues since the procedure. Continues on eliquis. He feels better post cardioversion with return of SR.   Follow up in the AF clinic 12/18/20. Patient reports he was back in afib just a few days after his last office visit with variable heart rates. ECG today shows rate controlled atrial flutter.   F/u in afib clinic 01/08/21. He had cardioversion but had very ERAF. We discussed again need for stronger antiarrythmic, tikosyn. He has f/u with Dr. Johney Frame 12/16 and he will further discuss with him. He did tell me that he had a bad choking experience with swallowing a pill when he was a child and he currently has to chew up his pills. He was  told that he would not be able to do that with Tikosyn and would have to swallow the capsule  whole.  He remains in rate controlled atrial flutter with controlled rates. He is symptomatic with fatigue being out of rhythm.   F/u in afib clinic, 03/06/21. He is here one week s/p Tikosyn admission. He is in Sinus brady.  He feels improved.   Follow up in the AF clinic 05/21/22. Patient reports that he has done well since his last visit. He had one episode of tachypalpitations lasting ~10 minutes. No bleeding issues on anticoagulation.   Today, he denies symptoms of palpitations, chest pain, shortness of breath, orthopnea, PND, lower extremity edema, dizziness, presyncope, syncope, or  neurologic sequela. The patient is tolerating medications without difficulties and is otherwise without complaint today.   Past Medical History:  Diagnosis Date   ATRIAL FIBRILLATION 06/16/2008   Qualifier: Diagnosis of  By: [name], RMA, [name]     Atrial fibrillation (HCC)    Blood in stool    Cardiomyopathy    improved    DJD (degenerative joint disease) of lumbar spine    Gout    Hepatitis C    Hx of echocardiogram    a. Echo 12/13:  EF 60-65%, mod LAE, mild RAE, PASP 34   Intrinsic asthma, unspecified    Substance abuse (HCC)    Unspecified polyarthropathy or polyarthritis, multiple sites    Past Surgical History:  Procedure Laterality Date   ATRIAL FIBRILLATION ABLATION N/A 10/16/2020   Procedure: ATRIAL FIBRILLATION ABLATION;  Surgeon: [name], MD;  Location: [facility] INVASIVE CV LAB;  Service: Cardiovascular;  Laterality: N/A;   CARDIOVERSION N/A 11/20/2020   Procedure: CARDIOVERSION;  Surgeon: Quintella Reichert, MD;  Location: [facility] ENDOSCOPY;  Service: Cardiovascular;  Laterality: N/A;   CARDIOVERSION N/A 12/31/2020   Procedure: CARDIOVERSION;  Surgeon: Chrystie Nose, MD;  Location: [facility] ENDOSCOPY;  Service: Cardiovascular;  Laterality: N/A;   HERNIA REPAIR  1966    Current Outpatient Medications  Medication Sig Dispense Refill   albuterol (VENTOLIN HFA) 108 (90 Base) MCG/ACT inhaler Inhale 2 puffs into the lungs every 6 (six) hours as needed for wheezing or shortness of breath.     allopurinol (ZYLOPRIM) 100 MG tablet Take 200 mg by mouth daily.     apixaban (ELIQUIS) 5 MG TABS tablet Take 1 tablet by mouth twice daily 60 tablet 5   B Complex-C (B-COMPLEX WITH VITAMIN C) tablet Take 1 tablet by mouth daily.     dofetilide (TIKOSYN) 500 MCG capsule Take 1 capsule (500 mcg total) by mouth 2 (two) times daily. 60 capsule 5   Glycerin (CLEAR EYES ADV DRY & ITCHY RLF) 0.25 % SOLN Place 1-2 drops into both eyes 3 (three) times daily as needed (dry and itchy eyes).      hydrocortisone cream 1 % Apply 1 application topically daily as needed for itching.     MAGNESIUM-OXIDE 400 (240 Mg) MG tablet Take 1 tablet by mouth daily.     metoprolol tartrate (LOPRESSOR) 25 MG tablet Take 1 tablet (25 mg total) by mouth 2 (two) times daily. 60 tablet 2   Multiple Vitamin (MULTIVITAMIN WITH MINERALS) TABS tablet Take 1 tablet by mouth in the morning.     sildenafil (VIAGRA) 100 MG tablet Take 100 mg by mouth daily as needed.     vitamin C (ASCORBIC ACID) 500 MG tablet Take 500 mg by mouth daily.     No current facility-administered medications for this encounter.  Allergies  Allergen Reactions   Indomethacin Other (See Comments)    Headaches and tired    Social History   Socioeconomic History   Marital status: Married    Spouse name: Not on file   Number of children: Not on file   Years of education: Not on file   Highest education level: Not on file  Occupational History   Not on file  Tobacco Use   Smoking status: Former    Types: Cigarettes    Quit date: 02/02/2014    Years since quitting: 8.3   Smokeless tobacco: Never   Tobacco comments:    started in 1999. smoked 1/4 ppd   Substance and Sexual Activity   Alcohol use: No    Alcohol/week: 0.0 standard drinks of alcohol    Comment: quit in 2006    Drug use: No    Comment: quit in 2006    Sexual activity: Not on file  Other Topics Concern   Not on file  Social History Narrative   Married; step children; disabled.    Social Determinants of Health   Financial Resource Strain: Not on file  Food Insecurity: Not on file  Transportation Needs: Not on file  Physical Activity: Not on file  Stress: Not on file  Social Connections: Not on file  Intimate Partner Violence: Not on file    Family History  Problem Relation Age of Onset   Heart failure Other        CHF - father and mother. (mother also had severe RA)    ROS- All systems are reviewed and negative except as per the HPI  above  Physical Exam: Vitals:   05/21/22 1149  BP: (!) 184/76  Pulse: (!) 54  Weight: 71.6 kg  Height:  (1.854 m)    Wt Readings from Last 3 Encounters:  05/21/22 71.6 kg  10/10/21 72 kg  04/03/21 75.8 kg    Labs: Lab Results  Component Value Date   NA 142 10/10/2021   K 4.7 10/10/2021   CL 106 10/10/2021   CO2 25 10/10/2021   GLUCOSE 90 10/10/2021   BUN 21 10/10/2021   CREATININE 1.12 10/10/2021   CALCIUM 8.7 10/10/2021   MG 1.9 10/10/2021    GEN- The patient is a well appearing male, alert and oriented x 3 today.   HEENT-head normocephalic, atraumatic, sclera clear, conjunctiva pink, hearing intact, trachea midline. Lungs- Clear to ausculation bilaterally, normal work of breathing Heart- Regular rate and rhythm, no murmurs, rubs or gallops  GI- soft, NT, ND, + BS Extremities- no clubbing, cyanosis, or edema MS- no significant deformity or atrophy Skin- no rash or lesion Psych- euthymic mood, full affect Neuro- strength and sensation are intact   EKG today demonstrates SB, LBBB, 1st degree AV block Vent. rate 54 BPM PR interval 208 ms QRS duration 158 ms QT/QTcB 522/495 ms   Echo- 03/22/20 1. Left ventricular ejection fraction, by estimation, is 55 to 60%. The  left ventricle has normal function. The left ventricle has no regional  wall motion abnormalities. There is mild concentric left ventricular  hypertrophy of the posterior segment.  Left ventricular diastolic parameters are indeterminate. The average left ventricular global longitudinal strain is -19.1 %. The global longitudinal strain is normal.   2. Right ventricular systolic function is normal. The right ventricular  size is normal.   3. Left atrial size was severely dilated. LA diam 4.70 cm but with volume of 185 ml  4. Right atrial  size was severely dilated.   5. The mitral valve is normal in structure. Mild mitral valve  regurgitation. No evidence of mitral stenosis.   6. Tricuspid  valve regurgitation is mild to moderate.   7. The aortic valve is normal in structure. Aortic valve regurgitation is  not visualized. No aortic stenosis is present.   8. The inferior vena cava is normal in size with greater than 50%    CHA2DS2-VASc Score = 2  The patient's score is based upon: CHF History: 0 HTN History: 1 Diabetes History: 0 Stroke History: 0 Vascular Disease History: 0 Age Score: 1 Gender Score: 0       ASSESSMENT AND PLAN: 1. Persistent Atrial Fibrillation/atrial flutter The patient's CHA2DS2-VASc score is 2, indicating a 2.2% annual risk of stroke.   S/p ablation  10/16/20 He had recurrent atrial arrhythmia's not responsive to flecainide S/p dofetilide loading 02/2021 Patient appears to be maintaining SR.  Continue Tikosyn 500 mcg BID. QT stable in setting of LBBB. Check bmet/mag today.  Continue Lopressor 25 mg BID Continue Eliquis 5 mg BID  2. Secondary Hypercoagulable State (ICD10:  D68.69) The patient is at significant risk for stroke/thromboembolism based upon his CHA2DS2-VASc Score of 2.  Continue Apixaban (Eliquis).   3. HTN Elevated today, patient very anxious trying to find a parking place today.  Better controlled at home and at previous visits.  No changes today.   4. LBBB New finding on ECG although it does appear that he has had aberrant conduction/rate dependent block in the past. He denies any symptoms of chest pain, SOB, edema, or orthopnea.  CAC score on CT in 2022 was 0. Echo 2022 showed normal LV function. D/w EP, given recent normal testing and paucity of symptoms, will monitor for now. If he has any symptoms would have low threshold to repeat echo.    Follow up in the AF clinic in 6 months.    Jorja Loa PA-C Afib Clinic [facility] [address] [place], [place] [address] [phone]

## 2022-05-22 ENCOUNTER — Encounter: Payer: Self-pay | Admitting: Cardiology

## 2022-05-29 ENCOUNTER — Telehealth: Payer: Self-pay | Admitting: Cardiology

## 2022-05-29 NOTE — Telephone Encounter (Signed)
Patient called 3x with voicemails left, to schedule follow up appointment per the following staff message:  Shona Simpson, RN  P Cv Div Nl Scheduling Pt needs appt with [facility] for follow up (past due) per ricky fenton thanks stacy

## 2022-06-10 ENCOUNTER — Encounter: Payer: Self-pay | Admitting: Cardiology

## 2022-06-11 MED ORDER — MAGNESIUM-OXIDE 400 (240 MG) MG PO TABS: 1.0000 | ORAL_TABLET | Freq: Every day | ORAL | 1 refills | Status: DC

## 2022-07-09 ENCOUNTER — Encounter: Payer: Self-pay | Admitting: Cardiology

## 2022-07-10 ENCOUNTER — Other Ambulatory Visit: Payer: Self-pay

## 2022-07-10 MED ORDER — METOPROLOL TARTRATE 25 MG PO TABS: 25.0000 mg | ORAL_TABLET | Freq: Two times a day (BID) | ORAL | 0 refills | Status: DC

## 2022-08-04 ENCOUNTER — Encounter: Payer: Self-pay | Admitting: Cardiology

## 2022-08-05 ENCOUNTER — Encounter: Payer: Self-pay | Admitting: Cardiology

## 2022-08-05 ENCOUNTER — Other Ambulatory Visit: Payer: Self-pay | Admitting: Cardiology

## 2022-08-05 ENCOUNTER — Other Ambulatory Visit: Payer: Self-pay

## 2022-08-05 MED ORDER — METOPROLOL TARTRATE 25 MG PO TABS: 25.0000 mg | ORAL_TABLET | Freq: Two times a day (BID) | ORAL | 0 refills | Status: DC

## 2022-08-05 NOTE — Telephone Encounter (Signed)
Routing to AFib clinic to call pt and arrange OV per Dr. Elberta Fortis

## 2022-08-05 NOTE — Telephone Encounter (Signed)
Dr. Elberta Fortis recommends follow up in the Afib clinic. See today's mychart message

## 2022-08-31 ENCOUNTER — Encounter (HOSPITAL_COMMUNITY): Payer: Self-pay | Admitting: Internal Medicine

## 2022-08-31 ENCOUNTER — Ambulatory Visit (HOSPITAL_COMMUNITY)
Admission: RE | Admit: 2022-08-31 | Discharge: 2022-08-31 | Disposition: A | Discharge status: Home / Self Care | Payer: Medicare Other | Source: Ambulatory Visit | Attending: Internal Medicine | Admitting: Internal Medicine

## 2022-08-31 ENCOUNTER — Other Ambulatory Visit (HOSPITAL_COMMUNITY): Payer: Self-pay | Admitting: Internal Medicine

## 2022-08-31 VITALS — BP 132/92 | HR 97 | Ht 73.0 in | Wt 161.2 lb

## 2022-08-31 DIAGNOSIS — Z7901 Long term (current) use of anticoagulants: Secondary | ICD-10-CM | POA: Insufficient documentation

## 2022-08-31 DIAGNOSIS — I10 Essential (primary) hypertension: Secondary | ICD-10-CM | POA: Diagnosis not present

## 2022-08-31 DIAGNOSIS — I4819 Other persistent atrial fibrillation: Secondary | ICD-10-CM

## 2022-08-31 DIAGNOSIS — I4892 Unspecified atrial flutter: Secondary | ICD-10-CM | POA: Diagnosis not present

## 2022-08-31 DIAGNOSIS — I447 Left bundle-branch block, unspecified: Secondary | ICD-10-CM | POA: Insufficient documentation

## 2022-08-31 DIAGNOSIS — I480 Paroxysmal atrial fibrillation: Secondary | ICD-10-CM | POA: Diagnosis present

## 2022-08-31 DIAGNOSIS — D6869 Other thrombophilia: Secondary | ICD-10-CM | POA: Insufficient documentation

## 2022-08-31 LAB — CBC
HCT: 38.9 % — ABNORMAL LOW (ref 39.0–52.0)
Hemoglobin: 12.9 g/dL — ABNORMAL LOW (ref 13.0–17.0)
MCH: 30.5 pg (ref 26.0–34.0)
MCHC: 33.2 g/dL (ref 30.0–36.0)
MCV: 92 fL (ref 80.0–100.0)
Platelets: 156 10*3/uL (ref 150–400)
RBC: 4.23 MIL/uL (ref 4.22–5.81)
RDW: 14.7 % (ref 11.5–15.5)
WBC: 5.3 10*3/uL (ref 4.0–10.5)
nRBC: 0 % (ref 0.0–0.2)

## 2022-08-31 LAB — BASIC METABOLIC PANEL
Anion gap: 11 (ref 5–15)
BUN: 20 mg/dL (ref 8–23)
CO2: 27 mmol/L (ref 22–32)
Calcium: 9.1 mg/dL (ref 8.9–10.3)
Chloride: 101 mmol/L (ref 98–111)
Creatinine, Ser: 1.25 mg/dL — ABNORMAL HIGH (ref 0.61–1.24)
GFR, Estimated: >60 mL/min (ref >60)
Glucose, Bld: 103 mg/dL — ABNORMAL HIGH (ref 70–99)
Potassium: 4.6 mmol/L (ref 3.5–5.1)
Sodium: 139 mmol/L (ref 135–145)

## 2022-08-31 LAB — MAGNESIUM: Magnesium: 2 mg/dL (ref 1.7–2.4)

## 2022-08-31 NOTE — Patient Instructions (Addendum)
Cardioversion scheduled for: August 2nd 2024   - Arrive at the [facility] and go to admitting at 7:30am   - Do not eat or drink anything after midnight the night prior to your procedure.   - Take all your morning medication (except diabetic medications) with a sip of water prior to arrival.  - You will not be able to drive home after your procedure.    - Do NOT miss any doses of your blood thinner - if you should miss a dose please notify our office immediately.   - If you feel as if you go back into normal rhythm prior to scheduled cardioversion, please notify our office immediately.   If your procedure is canceled in the cardioversion suite you will be charged a cancellation fee.

## 2022-08-31 NOTE — Progress Notes (Signed)
Primary Care Physician: Alferd Apa, MD Referring Physician:Dr. 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 His metoprolol dose was lowered earlier in the year for bradycardia in SR. He has had 4 episodes since the dose was lowered and this last episode has been persistent since last week. EKG shows atrial flutter with variable rate, with v rates in the 50's. Highest v rates have been in 80-90 bpm with exertion. He had a NICM when first dx with afib in 2008. LHC at that time showed normal coronary arteries and EF of 50%. Lat echo 03/2020, showed normal EF with severely dilated left atrium with a diameter of 4.70 and volume of 185 ml.   He denies any alcohol, tobacco, substance abuse,  excessive caffeine. He denies snoring unless he is on his back. No prior sleep study.   He has a CHA2DS2VASc score of 2(HTN, age). Dr. Jens Som started him on eliquis 5 mg bid several months ago but the pt states he did not start it as he was afraid of bleeding side effects. He states he has had 5 cardioversion's in the past, as well as some were TEE guided. He would like to have a CV asap. He voices exertional dyspnea, fatigue with being out of rhythm.    F/u in afib clinic, 07/26/19, he has returned to atrial flutter with variable block, v rates in the 60's, 5 days ago. I saw him late May, he was out of rhythm and cardioversion was planned but he self converted. There were several my chart messages that he stated that he was having fatigue on eliquis and wanted to stop it. He was told by PharmD that it was likely 2/2 the BB not eliquis. He has a h/o hep C and pharmacist at [facility] reassured him that eliquis was safe in that situation. He does not like being on meds. Very fatigued. I discussed ablation or change in antiarrythmic and he would prefer to discuss ablation. I feel even if he is  cardioverted, he may go back to afib/flutter soon, as he appears to be failing flecainide.   F/u in afib clinic, 11/13/20. He had an ablation one month ago and did maintain SR for almost 2 weeks. Since then he feels he is out of rhythm. He appears to be in an atrial flutter with variable block today. He continues on flecainide and metoprolol as well as eliquis 5 mg bid for a CHA2DS2VASc  score of 2. We discussed pursing cardioversion and he was in agreement.  F/u 10/26 in afib clinic. Had a successful cardioversion 11/20/20. He is in SR today. He remains on flecainide. No swallowing or groin issues since the procedure. Continues on eliquis. He feels better post cardioversion with return of SR.   Follow up in the AF clinic 12/18/20. Patient reports he was back in afib just a few days after his last office visit with variable heart rates. ECG today shows rate controlled atrial flutter.   F/u in afib clinic 01/08/21. He had cardioversion but had very ERAF. We discussed again need for stronger antiarrythmic, tikosyn. He has f/u with Dr. Johney Frame 12/16 and he will further discuss with him. He did tell me that he had a bad choking experience with swallowing a pill when he was a child and he currently has to chew up his pills. He was  told that he would not be able to do that with Tikosyn and would have to swallow the capsule  whole.  He remains in rate controlled atrial flutter with controlled rates. He is symptomatic with fatigue being out of rhythm.   F/u in afib clinic, 03/06/21. He is here one week s/p Tikosyn admission. He is in Sinus brady.  He feels improved.   Follow up in the AF clinic 05/21/22. Patient reports that he has done well since his last visit. He had one episode of tachypalpitations lasting ~10 minutes. No bleeding issues on anticoagulation.   F/u in Afib clinic, 08/31/22. He appears to be in atrial flutter today. Patient contacted office on 7/2 noting asymptomatic increase in HR. He states  historically he has had periodic episodes of higher HR from 10 min to 15 hours but this is the first time on Tikosyn his HR has been persistently elevated. Currently on Tikosyn 500 mcg BID. No bleeding issues on Eliquis. No missed doses of Tikosyn or Eliquis.   Today, he denies symptoms of palpitations, chest pain, shortness of breath, orthopnea, PND, lower extremity edema, dizziness, presyncope, syncope, or neurologic sequela. The patient is tolerating medications without difficulties and is otherwise without complaint today.   Past Medical History:  Diagnosis Date   ATRIAL FIBRILLATION 06/16/2008   Qualifier: Diagnosis of  By: [name], RMA, [name]     Atrial fibrillation (HCC)    Blood in stool    Cardiomyopathy    improved    DJD (degenerative joint disease) of lumbar spine    Gout    Hepatitis C    Hx of echocardiogram    a. Echo 12/13:  EF 60-65%, mod LAE, mild RAE, PASP 34   Intrinsic asthma, unspecified    Substance abuse (HCC)    Unspecified polyarthropathy or polyarthritis, multiple sites    Past Surgical History:  Procedure Laterality Date   ATRIAL FIBRILLATION ABLATION N/A 10/16/2020   Procedure: ATRIAL FIBRILLATION ABLATION;  Surgeon: [name], MD;  Location: [facility] INVASIVE CV LAB;  Service: Cardiovascular;  Laterality: N/A;   CARDIOVERSION N/A 11/20/2020   Procedure: CARDIOVERSION;  Surgeon: Quintella Reichert, MD;  Location: [facility] ENDOSCOPY;  Service: Cardiovascular;  Laterality: N/A;   CARDIOVERSION N/A 12/31/2020   Procedure: CARDIOVERSION;  Surgeon: Chrystie Nose, MD;  Location: [facility] ENDOSCOPY;  Service: Cardiovascular;  Laterality: N/A;   HERNIA REPAIR  1966    Current Outpatient Medications  Medication Sig Dispense Refill   albuterol (VENTOLIN HFA) 108 (90 Base) MCG/ACT inhaler Inhale 2 puffs into the lungs every 6 (six) hours as needed for wheezing or shortness of breath.     allopurinol (ZYLOPRIM) 100 MG tablet Take 200 mg by mouth daily.     apixaban (ELIQUIS) 5  MG TABS tablet Take 1 tablet by mouth twice daily 60 tablet 5   B Complex-C (B-COMPLEX WITH VITAMIN C) tablet Take 1 tablet by mouth daily.     dofetilide (TIKOSYN) 500 MCG capsule Take 1 capsule (500 mcg total) by mouth 2 (two) times daily. 60 capsule 5   Glycerin (CLEAR EYES ADV DRY & ITCHY RLF) 0.25 % SOLN Place 1-2 drops into both eyes 3 (three) times daily as needed (dry and itchy eyes).     hydrocortisone cream 1 % Apply 1 application topically daily as needed for itching.     MAGNESIUM-OXIDE 400 (240 Mg) MG tablet Take 1 tablet (400 mg total) by mouth daily. 90 tablet 1   metoprolol tartrate (LOPRESSOR) 25 MG  tablet Take 1 tablet (25 mg total) by mouth 2 (two) times daily. Please keep appointment for 9/924 for any further refills 120 tablet 0   Multiple Vitamin (MULTIVITAMIN WITH MINERALS) TABS tablet Take 1 tablet by mouth in the morning.     sildenafil (VIAGRA) 100 MG tablet Take 100 mg by mouth daily as needed.     vitamin C (ASCORBIC ACID) 500 MG tablet Take 500 mg by mouth daily.     No current facility-administered medications for this encounter.    Allergies  Allergen Reactions   Indomethacin Other (See Comments)    Headaches and tired    ROS- All systems are reviewed and negative except as per the HPI above  Physical Exam: Vitals:   08/31/22 1316  BP: (!) 132/92  Pulse: 97  Weight: 73.1 kg  Height: 6\' 1"  (1.854 m)     Wt Readings from Last 3 Encounters:  08/31/22 73.1 kg  05/21/22 71.6 kg  10/10/21 72 kg    Labs: Lab Results  Component Value Date   NA 140 05/21/2022   K 4.1 05/21/2022   CL 105 05/21/2022   CO2 27 05/21/2022   GLUCOSE 129 (H) 05/21/2022   BUN 15 05/21/2022   CREATININE 1.06 05/21/2022   CALCIUM 8.6 (L) 05/21/2022   MG 2.1 05/21/2022    GEN- The patient is well appearing, alert and oriented x 3 today.   Neck - no JVD or carotid bruit noted Lungs- Clear to ausculation bilaterally, normal work of breathing Heart- Regular rate and  rhythm, no murmurs, rubs or gallops, PMI not laterally displaced Extremities- no clubbing, cyanosis, or edema Skin - no rash or ecchymosis noted   EKG today demonstrates Vent. rate 97 BPM PR interval 156 ms QRS duration 160 ms QT/QTcB 434/551 ms P-R-T axes 90 73 55 Normal sinus rhythm - appears to be atrial flutter Left bundle branch block Abnormal ECG When compared with ECG of 21-May-2022 11:56, PREVIOUS ECG IS PRESENT  Echo- 03/22/20 1. Left ventricular ejection fraction, by estimation, is 55 to 60%. The  left ventricle has normal function. The left ventricle has no regional  wall motion abnormalities. There is mild concentric left ventricular  hypertrophy of the posterior segment.  Left ventricular diastolic parameters are indeterminate. The average left ventricular global longitudinal strain is -19.1 %. The global longitudinal strain is normal.   2. Right ventricular systolic function is normal. The right ventricular  size is normal.   3. Left atrial size was severely dilated. LA diam 4.70 cm but with volume of 185 ml  4. Right atrial size was severely dilated.   5. The mitral valve is normal in structure. Mild mitral valve  regurgitation. No evidence of mitral stenosis.   6. Tricuspid valve regurgitation is mild to moderate.   7. The aortic valve is normal in structure. Aortic valve regurgitation is  not visualized. No aortic stenosis is present.   8. The inferior vena cava is normal in size with greater than 50%    CHA2DS2-VASc Score = 2  The patient's score is based upon: CHF History: 0 HTN History: 1 Diabetes History: 0 Stroke History: 0 Vascular Disease History: 0 Age Score: 1 Gender Score: 0      ASSESSMENT AND PLAN: 1. Persistent Atrial Fibrillation/atrial flutter The patient's CHA2DS2-VASc score is 2, indicating a 2.2% annual risk of stroke.   S/p ablation  10/16/20 He had recurrent atrial arrhythmia's not responsive to flecainide S/p dofetilide loading  02/2021  Patient appears  to be in atrial flutter today. After discussion of treatment options, patient agrees to proceed with scheduling DCCV. We will obtain labs today and plan to see him 2 weeks after DCCV.   Continue Tikosyn 500 mcg BID.   Informed Consent   Shared Decision Making/Informed Consent The risks (stroke, cardiac arrhythmias rarely resulting in the need for a temporary or permanent pacemaker, skin irritation or burns and complications associated with conscious sedation including aspiration, arrhythmia, respiratory failure and death), benefits (restoration of normal sinus rhythm) and alternatives of a direct current cardioversion were explained in detail to Mr. Gossen and he agrees to proceed.        2. Secondary Hypercoagulable State (ICD10:  D68.69) The patient is at significant risk for stroke/thromboembolism based upon his CHA2DS2-VASc Score of 2.  Continue Apixaban (Eliquis).   3. HTN Better controlled at home and at previous visits.  No changes today.   4. LBBB New finding on ECG although it does appear that he has had aberrant conduction/rate dependent block in the past. He denies any symptoms of chest pain, SOB, edema, or orthopnea.  CAC score on CT in 2022 was 0. Echo 2022 showed normal LV function. D/w EP, given recent normal testing and paucity of symptoms, will monitor for now. If he has any symptoms would have low threshold to repeat echo.    Follow up 2 weeks after DCCV.   Lake Bells, PA-C Afib Clinic [facility] [address] [place], [place] [address] [phone]

## 2022-08-31 NOTE — H&P (View-Only) (Signed)
Primary Care Physician: Alferd Apa, MD Referring Physician:Dr. 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 His metoprolol dose was lowered earlier in the year for bradycardia in SR. He has had 4 episodes since the dose was lowered and this last episode has been persistent since last week. EKG shows atrial flutter with variable rate, with v rates in the 50's. Highest v rates have been in 80-90 bpm with exertion. He had a NICM when first dx with afib in 2008. LHC at that time showed normal coronary arteries and EF of 50%. Lat echo 03/2020, showed normal EF with severely dilated left atrium with a diameter of 4.70 and volume of 185 ml.   He denies any alcohol, tobacco, substance abuse,  excessive caffeine. He denies snoring unless he is on his back. No prior sleep study.   He has a CHA2DS2VASc score of 2(HTN, age). Dr. Jens Som started him on eliquis 5 mg bid several months ago but the pt states he did not start it as he was afraid of bleeding side effects. He states he has had 5 cardioversion's in the past, as well as some were TEE guided. He would like to have a CV asap. He voices exertional dyspnea, fatigue with being out of rhythm.    F/u in afib clinic, 07/26/19, he has returned to atrial flutter with variable block, v rates in the 60's, 5 days ago. I saw him late May, he was out of rhythm and cardioversion was planned but he self converted. There were several my chart messages that he stated that he was having fatigue on eliquis and wanted to stop it. He was told by PharmD that it was likely 2/2 the BB not eliquis. He has a h/o hep C and pharmacist at [facility] reassured him that eliquis was safe in that situation. He does not like being on meds. Very fatigued. I discussed ablation or change in antiarrythmic and he would prefer to discuss ablation. I feel even if he is  cardioverted, he may go back to afib/flutter soon, as he appears to be failing flecainide.   F/u in afib clinic, 11/13/20. He had an ablation one month ago and did maintain SR for almost 2 weeks. Since then he feels he is out of rhythm. He appears to be in an atrial flutter with variable block today. He continues on flecainide and metoprolol as well as eliquis 5 mg bid for a CHA2DS2VASc  score of 2. We discussed pursing cardioversion and he was in agreement.  F/u 10/26 in afib clinic. Had a successful cardioversion 11/20/20. He is in SR today. He remains on flecainide. No swallowing or groin issues since the procedure. Continues on eliquis. He feels better post cardioversion with return of SR.   Follow up in the AF clinic 12/18/20. Patient reports he was back in afib just a few days after his last office visit with variable heart rates. ECG today shows rate controlled atrial flutter.   F/u in afib clinic 01/08/21. He had cardioversion but had very ERAF. We discussed again need for stronger antiarrythmic, tikosyn. He has f/u with Dr. Johney Frame 12/16 and he will further discuss with him. He did tell me that he had a bad choking experience with swallowing a pill when he was a child and he currently has to chew up his pills. He was  told that he would not be able to do that with Tikosyn and would have to swallow the capsule  whole.  He remains in rate controlled atrial flutter with controlled rates. He is symptomatic with fatigue being out of rhythm.   F/u in afib clinic, 03/06/21. He is here one week s/p Tikosyn admission. He is in Sinus brady.  He feels improved.   Follow up in the AF clinic 05/21/22. Patient reports that he has done well since his last visit. He had one episode of tachypalpitations lasting ~10 minutes. No bleeding issues on anticoagulation.   F/u in Afib clinic, 08/31/22. He appears to be in atrial flutter today. Patient contacted office on 7/2 noting asymptomatic increase in HR. He states  historically he has had periodic episodes of higher HR from 10 min to 15 hours but this is the first time on Tikosyn his HR has been persistently elevated. Currently on Tikosyn 500 mcg BID. No bleeding issues on Eliquis. No missed doses of Tikosyn or Eliquis.   Today, he denies symptoms of palpitations, chest pain, shortness of breath, orthopnea, PND, lower extremity edema, dizziness, presyncope, syncope, or neurologic sequela. The patient is tolerating medications without difficulties and is otherwise without complaint today.   Past Medical History:  Diagnosis Date   ATRIAL FIBRILLATION 06/16/2008   Qualifier: Diagnosis of  By: [name], RMA, [name]     Atrial fibrillation (HCC)    Blood in stool    Cardiomyopathy    improved    DJD (degenerative joint disease) of lumbar spine    Gout    Hepatitis C    Hx of echocardiogram    a. Echo 12/13:  EF 60-65%, mod LAE, mild RAE, PASP 34   Intrinsic asthma, unspecified    Substance abuse (HCC)    Unspecified polyarthropathy or polyarthritis, multiple sites    Past Surgical History:  Procedure Laterality Date   ATRIAL FIBRILLATION ABLATION N/A 10/16/2020   Procedure: ATRIAL FIBRILLATION ABLATION;  Surgeon: [name], MD;  Location: [facility] INVASIVE CV LAB;  Service: Cardiovascular;  Laterality: N/A;   CARDIOVERSION N/A 11/20/2020   Procedure: CARDIOVERSION;  Surgeon: Quintella Reichert, MD;  Location: [facility] ENDOSCOPY;  Service: Cardiovascular;  Laterality: N/A;   CARDIOVERSION N/A 12/31/2020   Procedure: CARDIOVERSION;  Surgeon: Chrystie Nose, MD;  Location: [facility] ENDOSCOPY;  Service: Cardiovascular;  Laterality: N/A;   HERNIA REPAIR  1966    Current Outpatient Medications  Medication Sig Dispense Refill   albuterol (VENTOLIN HFA) 108 (90 Base) MCG/ACT inhaler Inhale 2 puffs into the lungs every 6 (six) hours as needed for wheezing or shortness of breath.     allopurinol (ZYLOPRIM) 100 MG tablet Take 200 mg by mouth daily.     apixaban (ELIQUIS) 5  MG TABS tablet Take 1 tablet by mouth twice daily 60 tablet 5   B Complex-C (B-COMPLEX WITH VITAMIN C) tablet Take 1 tablet by mouth daily.     dofetilide (TIKOSYN) 500 MCG capsule Take 1 capsule (500 mcg total) by mouth 2 (two) times daily. 60 capsule 5   Glycerin (CLEAR EYES ADV DRY & ITCHY RLF) 0.25 % SOLN Place 1-2 drops into both eyes 3 (three) times daily as needed (dry and itchy eyes).     hydrocortisone cream 1 % Apply 1 application topically daily as needed for itching.     MAGNESIUM-OXIDE 400 (240 Mg) MG tablet Take 1 tablet (400 mg total) by mouth daily. 90 tablet 1   metoprolol tartrate (LOPRESSOR) 25 MG  tablet Take 1 tablet (25 mg total) by mouth 2 (two) times daily. Please keep appointment for 9/924 for any further refills 120 tablet 0   Multiple Vitamin (MULTIVITAMIN WITH MINERALS) TABS tablet Take 1 tablet by mouth in the morning.     sildenafil (VIAGRA) 100 MG tablet Take 100 mg by mouth daily as needed.     vitamin C (ASCORBIC ACID) 500 MG tablet Take 500 mg by mouth daily.     No current facility-administered medications for this encounter.    Allergies  Allergen Reactions   Indomethacin Other (See Comments)    Headaches and tired    ROS- All systems are reviewed and negative except as per the HPI above  Physical Exam: Vitals:   08/31/22 1316  BP: (!) 132/92  Pulse: 97  Weight: 73.1 kg  Height: 6\' 1"  (1.854 m)     Wt Readings from Last 3 Encounters:  08/31/22 73.1 kg  05/21/22 71.6 kg  10/10/21 72 kg    Labs: Lab Results  Component Value Date   NA 140 05/21/2022   K 4.1 05/21/2022   CL 105 05/21/2022   CO2 27 05/21/2022   GLUCOSE 129 (H) 05/21/2022   BUN 15 05/21/2022   CREATININE 1.06 05/21/2022   CALCIUM 8.6 (L) 05/21/2022   MG 2.1 05/21/2022    GEN- The patient is well appearing, alert and oriented x 3 today.   Neck - no JVD or carotid bruit noted Lungs- Clear to ausculation bilaterally, normal work of breathing Heart- Regular rate and  rhythm, no murmurs, rubs or gallops, PMI not laterally displaced Extremities- no clubbing, cyanosis, or edema Skin - no rash or ecchymosis noted   EKG today demonstrates Vent. rate 97 BPM PR interval 156 ms QRS duration 160 ms QT/QTcB 434/551 ms P-R-T axes 90 73 55 Normal sinus rhythm - appears to be atrial flutter Left bundle branch block Abnormal ECG When compared with ECG of 21-May-2022 11:56, PREVIOUS ECG IS PRESENT  Echo- 03/22/20 1. Left ventricular ejection fraction, by estimation, is 55 to 60%. The  left ventricle has normal function. The left ventricle has no regional  wall motion abnormalities. There is mild concentric left ventricular  hypertrophy of the posterior segment.  Left ventricular diastolic parameters are indeterminate. The average left ventricular global longitudinal strain is -19.1 %. The global longitudinal strain is normal.   2. Right ventricular systolic function is normal. The right ventricular  size is normal.   3. Left atrial size was severely dilated. LA diam 4.70 cm but with volume of 185 ml  4. Right atrial size was severely dilated.   5. The mitral valve is normal in structure. Mild mitral valve  regurgitation. No evidence of mitral stenosis.   6. Tricuspid valve regurgitation is mild to moderate.   7. The aortic valve is normal in structure. Aortic valve regurgitation is  not visualized. No aortic stenosis is present.   8. The inferior vena cava is normal in size with greater than 50%    CHA2DS2-VASc Score = 2  The patient's score is based upon: CHF History: 0 HTN History: 1 Diabetes History: 0 Stroke History: 0 Vascular Disease History: 0 Age Score: 1 Gender Score: 0      ASSESSMENT AND PLAN: 1. Persistent Atrial Fibrillation/atrial flutter The patient's CHA2DS2-VASc score is 2, indicating a 2.2% annual risk of stroke.   S/p ablation  10/16/20 He had recurrent atrial arrhythmia's not responsive to flecainide S/p dofetilide loading  02/2021  Patient appears  to be in atrial flutter today. After discussion of treatment options, patient agrees to proceed with scheduling DCCV. We will obtain labs today and plan to see him 2 weeks after DCCV.   Continue Tikosyn 500 mcg BID.   Informed Consent   Shared Decision Making/Informed Consent The risks (stroke, cardiac arrhythmias rarely resulting in the need for a temporary or permanent pacemaker, skin irritation or burns and complications associated with conscious sedation including aspiration, arrhythmia, respiratory failure and death), benefits (restoration of normal sinus rhythm) and alternatives of a direct current cardioversion were explained in detail to Mr. Gossen and he agrees to proceed.        2. Secondary Hypercoagulable State (ICD10:  D68.69) The patient is at significant risk for stroke/thromboembolism based upon his CHA2DS2-VASc Score of 2.  Continue Apixaban (Eliquis).   3. HTN Better controlled at home and at previous visits.  No changes today.   4. LBBB New finding on ECG although it does appear that he has had aberrant conduction/rate dependent block in the past. He denies any symptoms of chest pain, SOB, edema, or orthopnea.  CAC score on CT in 2022 was 0. Echo 2022 showed normal LV function. D/w EP, given recent normal testing and paucity of symptoms, will monitor for now. If he has any symptoms would have low threshold to repeat echo.    Follow up 2 weeks after DCCV.   Lake Bells, PA-C [facility] [address] [place], [place] [address] [phone]

## 2022-09-03 NOTE — Progress Notes (Signed)
Left a voicemail and instructed them to come at 0700 and to be NPO after 0000.  Instructed pt to take [facility] in AM with a small sip of water.   Stated that pt will need to have a ride home and someone to stay with them for 24 hours after the procedure.

## 2022-09-04 ENCOUNTER — Ambulatory Visit (HOSPITAL_BASED_OUTPATIENT_CLINIC_OR_DEPARTMENT_OTHER): Payer: Medicare Other | Admitting: Anesthesiology

## 2022-09-04 ENCOUNTER — Encounter (HOSPITAL_COMMUNITY)
Admission: RE | Disposition: A | Discharge status: Home / Self Care | Payer: Self-pay | Source: Home / Self Care | Attending: Cardiology

## 2022-09-04 ENCOUNTER — Encounter (HOSPITAL_COMMUNITY): Payer: Self-pay | Admitting: Cardiology

## 2022-09-04 ENCOUNTER — Other Ambulatory Visit: Payer: Self-pay

## 2022-09-04 ENCOUNTER — Ambulatory Visit (HOSPITAL_COMMUNITY)
Admission: RE | Admit: 2022-09-04 | Discharge: 2022-09-04 | Disposition: A | Discharge status: Home / Self Care | Payer: Medicare Other | Source: Home / Self Care | Attending: Cardiology | Admitting: Cardiology

## 2022-09-04 ENCOUNTER — Ambulatory Visit (HOSPITAL_COMMUNITY): Payer: Medicare Other | Admitting: Anesthesiology

## 2022-09-04 DIAGNOSIS — Z7901 Long term (current) use of anticoagulants: Secondary | ICD-10-CM | POA: Insufficient documentation

## 2022-09-04 DIAGNOSIS — D6869 Other thrombophilia: Secondary | ICD-10-CM | POA: Diagnosis not present

## 2022-09-04 DIAGNOSIS — I447 Left bundle-branch block, unspecified: Secondary | ICD-10-CM | POA: Insufficient documentation

## 2022-09-04 DIAGNOSIS — I4892 Unspecified atrial flutter: Secondary | ICD-10-CM | POA: Insufficient documentation

## 2022-09-04 DIAGNOSIS — I4819 Other persistent atrial fibrillation: Secondary | ICD-10-CM | POA: Diagnosis not present

## 2022-09-04 DIAGNOSIS — Z87891 Personal history of nicotine dependence: Secondary | ICD-10-CM

## 2022-09-04 DIAGNOSIS — I10 Essential (primary) hypertension: Secondary | ICD-10-CM | POA: Diagnosis not present

## 2022-09-04 DIAGNOSIS — I484 Atypical atrial flutter: Secondary | ICD-10-CM | POA: Diagnosis not present

## 2022-09-04 DIAGNOSIS — Z79899 Other long term (current) drug therapy: Secondary | ICD-10-CM | POA: Insufficient documentation

## 2022-09-04 HISTORY — PX: CARDIOVERSION: SHX1299

## 2022-09-04 SURGERY — CARDIOVERSION
Anesthesia: General

## 2022-09-04 MED ORDER — PROPOFOL 10 MG/ML IV BOLUS
INTRAVENOUS | Status: DC | PRN
  Administered 2022-09-04: 60 mg via INTRAVENOUS
  Administered 2022-09-04: 30 mg via INTRAVENOUS

## 2022-09-04 MED ORDER — SODIUM CHLORIDE 0.9 % IV SOLN: INTRAVENOUS | Status: DC

## 2022-09-04 MED ORDER — METOPROLOL TARTRATE 25 MG PO TABS: 12.5000 mg | ORAL_TABLET | Freq: Two times a day (BID) | ORAL | 0 refills | Status: DC

## 2022-09-04 MED ORDER — LIDOCAINE 2% (20 MG/ML) 5 ML SYRINGE
INTRAMUSCULAR | Status: DC | PRN
  Administered 2022-09-04: 40 mg via INTRAVENOUS

## 2022-09-04 SURGICAL SUPPLY — 1 items: ELECT DEFIB PAD ADLT CADENCE (PAD) ×1 IMPLANT

## 2022-09-04 NOTE — Transfer of Care (Signed)
Immediate Anesthesia Transfer of Care Note  Patient: Scott Wagner  Procedure(s) Performed: CARDIOVERSION  Patient Location: PACU and Cath Lab  Anesthesia Type:MAC  Level of Consciousness: awake and alert   Airway & Oxygen Therapy: Patient Spontanous Breathing  Post-op Assessment: Report given to RN  Post vital signs: stable  Last Vitals:  Vitals Value Taken Time  BP    Temp    Pulse    Resp    SpO2      Last Pain:  Vitals:   09/04/22 0742  TempSrc: Temporal  PainSc: 0-No pain         Complications: No notable events documented.

## 2022-09-04 NOTE — Interval H&P Note (Signed)
History and Physical Interval Note:  09/04/2022 7:41 AM  Scott Wagner  has presented today for surgery, with the diagnosis of AFLUTTER.  The various methods of treatment have been discussed with the patient and family. After consideration of risks, benefits and other options for treatment, the patient has consented to  Procedure(s): CARDIOVERSION (N/A) as a surgical intervention.  The patient's history has been reviewed, patient examined, no change in status, stable for surgery.  I have reviewed the patient's chart and labs.  Questions were answered to the patient's satisfaction.     [name]

## 2022-09-04 NOTE — Anesthesia Preprocedure Evaluation (Addendum)
Anesthesia Evaluation  Patient identified by MRN, date of birth, ID band Patient awake    Reviewed: Allergy & Precautions, NPO status , Patient's Chart, lab work & pertinent test results, reviewed documented beta blocker date and time   Airway Mallampati: I  TM Distance: >3 FB Neck ROM: Full    Dental  (+) Teeth Intact, Dental Advisory Given   Pulmonary asthma , former smoker   breath sounds clear to auscultation       Cardiovascular + dysrhythmias Atrial Fibrillation  Rhythm:Irregular Rate:Normal  Echo: 1. Left ventricular ejection fraction, by estimation, is 55 to 60%. The  left ventricle has normal function. The left ventricle has no regional  wall motion abnormalities. There is mild concentric left ventricular  hypertrophy of the posterior segment.  Left ventricular diastolic parameters are indeterminate. The average left  ventricular global longitudinal strain is -19.1 %. The global longitudinal  strain is normal.   2. Right ventricular systolic function is normal. The right ventricular  size is normal.   3. Left atrial size was severely dilated.   4. Right atrial size was severely dilated.   5. The mitral valve is normal in structure. Mild mitral valve  regurgitation. No evidence of mitral stenosis.   6. Tricuspid valve regurgitation is mild to moderate.   7. The aortic valve is normal in structure. Aortic valve regurgitation is  not visualized. No aortic stenosis is present.   8. The inferior vena cava is normal in size with greater than 50%  respiratory variability, suggesting right atrial pressure of 3 mmHg.     Neuro/Psych negative neurological ROS  negative psych ROS   GI/Hepatic negative GI ROS,,,(+) Hepatitis -, C  Endo/Other  negative endocrine ROS    Renal/GU Renal disease     Musculoskeletal  (+) Arthritis ,    Abdominal   Peds  Hematology negative hematology ROS (+)   Anesthesia Other  Findings   Reproductive/Obstetrics                             Anesthesia Physical Anesthesia Plan  ASA: 3  Anesthesia Plan: General   Post-op Pain Management: Minimal or no pain anticipated   Induction:   PONV Risk Score and Plan: 0  Airway Management Planned: Natural Airway and Simple Face Mask  Additional Equipment:   Intra-op Plan:   Post-operative Plan:   Informed Consent: I have reviewed the patients History and Physical, chart, labs and discussed the procedure including the risks, benefits and alternatives for the proposed anesthesia with the patient or authorized representative who has indicated his/her understanding and acceptance.       Plan Discussed with: CRNA  Anesthesia Plan Comments:        Anesthesia Quick Evaluation

## 2022-09-04 NOTE — CV Procedure (Addendum)
Procedure:   DCCV  Indication:  Symptomatic atrial flutter  Procedure Note:  The patient signed informed consent.  They have had had therapeutic anticoagulation with Eliquis greater than 3 weeks.  Anesthesia was administered by Dr. Hart Rochester and Reggy Eye, CRNA.  Adequate airway was maintained throughout and vital followed per protocol.  They were cardioverted x 1 with 100J of biphasic synchronized energy.  They converted to Sinus bradycardia with rate 40s-50s.  There were no apparent complications.  The patient had normal neuro status and respiratory status post procedure with vitals stable as recorded elsewhere.    Follow up:  They will continue on current medical therapy and follow up with cardiology as scheduled.  Reduced metoprolol dose to 12.5 mg BID given bradycardia to 40s after cardioversion  Epifanio Lesches, MD 09/04/2022 8:48 AM

## 2022-09-07 ENCOUNTER — Encounter (HOSPITAL_COMMUNITY): Payer: Self-pay | Admitting: Cardiology

## 2022-09-08 ENCOUNTER — Encounter: Payer: Self-pay | Admitting: Cardiology

## 2022-09-08 ENCOUNTER — Encounter (HOSPITAL_COMMUNITY): Payer: Self-pay | Admitting: Cardiology

## 2022-09-08 NOTE — Anesthesia Postprocedure Evaluation (Signed)
Anesthesia Post Note  Patient: Scott Wagner  Procedure(s) Performed: CARDIOVERSION     Patient location during evaluation: PACU Anesthesia Type: General Level of consciousness: awake and alert Pain management: pain level controlled Vital Signs Assessment: post-procedure vital signs reviewed and stable Respiratory status: spontaneous breathing, nonlabored ventilation, respiratory function stable and patient connected to nasal cannula oxygen Cardiovascular status: blood pressure returned to baseline and stable Postop Assessment: no apparent nausea or vomiting Anesthetic complications: no   No notable events documented.  Last Vitals:  Vitals:   09/04/22 0930 09/04/22 0933  BP: (!) 121/98 (!) 121/98  Pulse: (!) 48 (!) 49  Resp: 16 19  Temp: 36.7 C 36.7 C  SpO2: 98% 97%    Last Pain:  Vitals:   09/04/22 0906  TempSrc: Temporal  PainSc:                  Shelton Silvas

## 2022-09-09 MED ORDER — APIXABAN 5 MG PO TABS: 5.0000 mg | ORAL_TABLET | Freq: Two times a day (BID) | ORAL | 5 refills | Status: DC

## 2022-09-17 ENCOUNTER — Ambulatory Visit (HOSPITAL_COMMUNITY)
Admission: RE | Admit: 2022-09-17 | Discharge: 2022-09-17 | Disposition: A | Discharge status: Home / Self Care | Payer: Medicare Other | Source: Ambulatory Visit | Attending: Internal Medicine | Admitting: Internal Medicine

## 2022-09-17 ENCOUNTER — Encounter (HOSPITAL_COMMUNITY): Payer: Self-pay | Admitting: Internal Medicine

## 2022-09-17 VITALS — BP 122/82 | HR 51 | Ht 74.0 in | Wt 159.2 lb

## 2022-09-17 DIAGNOSIS — D6869 Other thrombophilia: Secondary | ICD-10-CM | POA: Insufficient documentation

## 2022-09-17 DIAGNOSIS — Z79899 Other long term (current) drug therapy: Secondary | ICD-10-CM | POA: Diagnosis not present

## 2022-09-17 DIAGNOSIS — Z5181 Encounter for therapeutic drug level monitoring: Secondary | ICD-10-CM

## 2022-09-17 DIAGNOSIS — I10 Essential (primary) hypertension: Secondary | ICD-10-CM | POA: Diagnosis not present

## 2022-09-17 DIAGNOSIS — I4892 Unspecified atrial flutter: Secondary | ICD-10-CM | POA: Insufficient documentation

## 2022-09-17 DIAGNOSIS — I447 Left bundle-branch block, unspecified: Secondary | ICD-10-CM | POA: Diagnosis not present

## 2022-09-17 DIAGNOSIS — I4819 Other persistent atrial fibrillation: Secondary | ICD-10-CM | POA: Insufficient documentation

## 2022-09-17 DIAGNOSIS — Z7901 Long term (current) use of anticoagulants: Secondary | ICD-10-CM | POA: Diagnosis not present

## 2022-09-17 NOTE — Progress Notes (Signed)
Primary Care Physician: Alferd Apa, MD Referring Physician:Dr. 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 His metoprolol dose was lowered earlier in the year for bradycardia in SR. He has had 4 episodes since the dose was lowered and this last episode has been persistent since last week. EKG shows atrial flutter with variable rate, with v rates in the 50's. Highest v rates have been in 80-90 bpm with exertion. He had a NICM when first dx with afib in 2008. LHC at that time showed normal coronary arteries and EF of 50%. Lat echo 03/2020, showed normal EF with severely dilated left atrium with a diameter of 4.70 and volume of 185 ml.   He denies any alcohol, tobacco, substance abuse,  excessive caffeine. He denies snoring unless he is on his back. No prior sleep study.   He has a CHA2DS2VASc score of 2(HTN, age). Dr. Jens Som started him on eliquis 5 mg bid several months ago but the pt states he did not start it as he was afraid of bleeding side effects. He states he has had 5 cardioversion's in the past, as well as some were TEE guided. He would like to have a CV asap. He voices exertional dyspnea, fatigue with being out of rhythm.    F/u in afib clinic, 07/26/19, he has returned to atrial flutter with variable block, v rates in the 60's, 5 days ago. I saw him late May, he was out of rhythm and cardioversion was planned but he self converted. There were several my chart messages that he stated that he was having fatigue on eliquis and wanted to stop it. He was told by PharmD that it was likely 2/2 the BB not eliquis. He has a h/o hep C and pharmacist at [facility] reassured him that eliquis was safe in that situation. He does not like being on meds. Very fatigued. I discussed ablation or change in antiarrythmic and he would prefer to discuss ablation. I feel even if he is  cardioverted, he may go back to afib/flutter soon, as he appears to be failing flecainide.   F/u in afib clinic, 11/13/20. He had an ablation one month ago and did maintain SR for almost 2 weeks. Since then he feels he is out of rhythm. He appears to be in an atrial flutter with variable block today. He continues on flecainide and metoprolol as well as eliquis 5 mg bid for a CHA2DS2VASc  score of 2. We discussed pursing cardioversion and he was in agreement.  F/u 10/26 in afib clinic. Had a successful cardioversion 11/20/20. He is in SR today. He remains on flecainide. No swallowing or groin issues since the procedure. Continues on eliquis. He feels better post cardioversion with return of SR.   Follow up in the AF clinic 12/18/20. Patient reports he was back in afib just a few days after his last office visit with variable heart rates. ECG today shows rate controlled atrial flutter.   F/u in afib clinic 01/08/21. He had cardioversion but had very ERAF. We discussed again need for stronger antiarrythmic, tikosyn. He has f/u with Dr. Johney Frame 12/16 and he will further discuss with him. He did tell me that he had a bad choking experience with swallowing a pill when he was a child and he currently has to chew up his pills. He was  told that he would not be able to do that with Tikosyn and would have to swallow the capsule  whole.  He remains in rate controlled atrial flutter with controlled rates. He is symptomatic with fatigue being out of rhythm.   F/u in afib clinic, 03/06/21. He is here one week s/p Tikosyn admission. He is in Sinus brady.  He feels improved.   Follow up in the AF clinic 05/21/22. Patient reports that he has done well since his last visit. He had one episode of tachypalpitations lasting ~10 minutes. No bleeding issues on anticoagulation.   F/u in Afib clinic, 08/31/22. He appears to be in atrial flutter today. Patient contacted office on 7/2 noting asymptomatic increase in HR. He states  historically he has had periodic episodes of higher HR from 10 min to 15 hours but this is the first time on Tikosyn his HR has been persistently elevated. Currently on Tikosyn 500 mcg BID. No bleeding issues on Eliquis. No missed doses of Tikosyn or Eliquis.   F/u in Afib clinic, 09/17/22. He is currently in NSR. S/p successful DCCV on 09/04/22. No missed doses of Eliquis. He feels great; he noted immediate relief of symptoms after he woke up from cardioversion. No episodes of Afib since DCCV.   Today, he denies symptoms of palpitations, chest pain, shortness of breath, orthopnea, PND, lower extremity edema, dizziness, presyncope, syncope, or neurologic sequela. The patient is tolerating medications without difficulties and is otherwise without complaint today.   Past Medical History:  Diagnosis Date   ATRIAL FIBRILLATION 06/16/2008   Qualifier: Diagnosis of  By: [name], RMA, [name]     Atrial fibrillation (HCC)    Blood in stool    Cardiomyopathy    improved    DJD (degenerative joint disease) of lumbar spine    Gout    Hepatitis C    Hx of echocardiogram    a. Echo 12/13:  EF 60-65%, mod LAE, mild RAE, PASP 34   Intrinsic asthma, unspecified    Substance abuse (HCC)    Unspecified polyarthropathy or polyarthritis, multiple sites    Past Surgical History:  Procedure Laterality Date   ATRIAL FIBRILLATION ABLATION N/A 10/16/2020   Procedure: ATRIAL FIBRILLATION ABLATION;  Surgeon: [name], MD;  Location: [facility] INVASIVE CV LAB;  Service: Cardiovascular;  Laterality: N/A;   CARDIOVERSION N/A 11/20/2020   Procedure: CARDIOVERSION;  Surgeon: Quintella Reichert, MD;  Location: [facility] ENDOSCOPY;  Service: Cardiovascular;  Laterality: N/A;   CARDIOVERSION N/A 12/31/2020   Procedure: CARDIOVERSION;  Surgeon: Chrystie Nose, MD;  Location: [facility] ENDOSCOPY;  Service: Cardiovascular;  Laterality: N/A;   CARDIOVERSION N/A 09/04/2022   Procedure: CARDIOVERSION;  Surgeon: [name], MD;   Location: [facility] INVASIVE CV LAB;  Service: Cardiovascular;  Laterality: N/A;   HERNIA REPAIR  1966    Current Outpatient Medications  Medication Sig Dispense Refill   albuterol (VENTOLIN HFA) 108 (90 Base) MCG/ACT inhaler Inhale 2 puffs into the lungs every 6 (six) hours as needed for wheezing or shortness of breath.     allopurinol (ZYLOPRIM) 100 MG tablet Take 200 mg by mouth daily.     apixaban (ELIQUIS) 5 MG TABS tablet Take 1 tablet (5 mg total) by mouth 2 (two) times daily. 60 tablet 5   B Complex-C (SUPER B COMPLEX PO) Take 1 tablet by mouth daily.     dofetilide (TIKOSYN) 500 MCG capsule Take 1 capsule (500 mcg total) by mouth 2 (two) times daily. 60 capsule 5  Glycerin (CLEAR EYES ADV DRY & ITCHY RLF) 0.25 % SOLN Place 1-2 drops into both eyes 3 (three) times daily as needed (dry and itchy eyes).     hydrocortisone cream 1 % Apply 1 application topically daily as needed for itching.     MAGNESIUM-OXIDE 400 (240 Mg) MG tablet Take 1 tablet (400 mg total) by mouth daily. 90 tablet 1   metoprolol tartrate (LOPRESSOR) 25 MG tablet Take 0.5 tablets (12.5 mg total) by mouth 2 (two) times daily. Please keep appointment for 9/924 for any further refills 60 tablet 0   Multiple Vitamin (MULTIVITAMIN WITH MINERALS) TABS tablet Take 1 tablet by mouth in the morning.     sildenafil (VIAGRA) 100 MG tablet Take 100 mg by mouth daily as needed for erectile dysfunction.     No current facility-administered medications for this encounter.    Allergies  Allergen Reactions   Indomethacin Other (See Comments)    Headaches and tired    ROS- All systems are reviewed and negative except as per the HPI above  Physical Exam: Vitals:   09/17/22 1332  BP: 122/82  Pulse: (!) 51  Weight: 72.2 kg  Height: 6\' 2"  (1.88 m)    Wt Readings from Last 3 Encounters:  09/17/22 72.2 kg  09/04/22 72.6 kg  08/31/22 73.1 kg   Labs: Lab Results  Component Value Date   NA 139 08/31/2022   K 4.6 08/31/2022    CL 101 08/31/2022   CO2 27 08/31/2022   GLUCOSE 103 (H) 08/31/2022   BUN 20 08/31/2022   CREATININE 1.25 (H) 08/31/2022   CALCIUM 9.1 08/31/2022   MG 2.0 08/31/2022   GEN- The patient is well appearing, alert and oriented x 3 today.   Neck - no JVD or carotid bruit noted Lungs- Clear to ausculation bilaterally, normal work of breathing Heart- Regular bradycardic rate and rhythm, no murmurs, rubs or gallops, PMI not laterally displaced Extremities- no clubbing, cyanosis, or edema Skin - no rash or ecchymosis noted   EKG today demonstrates Vent. rate 51 BPM PR interval 208 ms QRS duration 158 ms QT/QTcB 534/492 ms P-R-T axes -27 32 -13 Sinus bradycardia Left bundle branch block Abnormal ECG When compared with ECG of 04-Sep-2022 08:50, PREVIOUS ECG IS PRESENT  Echo- 03/22/20 1. Left ventricular ejection fraction, by estimation, is 55 to 60%. The  left ventricle has normal function. The left ventricle has no regional  wall motion abnormalities. There is mild concentric left ventricular  hypertrophy of the posterior segment.  Left ventricular diastolic parameters are indeterminate. The average left ventricular global longitudinal strain is -19.1 %. The global longitudinal strain is normal.   2. Right ventricular systolic function is normal. The right ventricular  size is normal.   3. Left atrial size was severely dilated. LA diam 4.70 cm but with volume of 185 ml  4. Right atrial size was severely dilated.   5. The mitral valve is normal in structure. Mild mitral valve  regurgitation. No evidence of mitral stenosis.   6. Tricuspid valve regurgitation is mild to moderate.   7. The aortic valve is normal in structure. Aortic valve regurgitation is  not visualized. No aortic stenosis is present.   8. The inferior vena cava is normal in size with greater than 50%    CHA2DS2-VASc Score = 2  The patient's score is based upon: CHF History: 0 HTN History: 1 Diabetes History:  0 Stroke History: 0 Vascular Disease History: 0 Age Score: 1 Gender  Score: 0      ASSESSMENT AND PLAN: 1. Persistent Atrial Fibrillation/atrial flutter The patient's CHA2DS2-VASc score is 2, indicating a 2.2% annual risk of stroke.   S/p ablation  10/16/20 He had recurrent atrial arrhythmia's not responsive to flecainide S/p dofetilide loading 02/2021 S/p successful DCCV on 09/04/22.  He is currently in SR.  Continue Tikosyn 500 mcg BID.   He could be a candidate for diltiazem prn if needed, but this would depend on HR and BP of course.   2. Secondary Hypercoagulable State (ICD10:  D68.69) The patient is at significant risk for stroke/thromboembolism based upon his CHA2DS2-VASc Score of 2.  Continue Apixaban (Eliquis).  No missed doses.  3. HTN Looks great today.  4. LBBB New finding on ECG although it does appear that he has had aberrant conduction/rate dependent block in the past. He denies any symptoms of chest pain, SOB, edema, or orthopnea.  CAC score on CT in 2022 was 0. Echo 2022 showed normal LV function. D/w EP, given recent normal testing and paucity of symptoms, will monitor for now. If he has any symptoms would have low threshold to repeat echo.    Follow up 6 months.   Lake Bells, PA-C Afib Clinic [facility] [address] [place], [place] [address] [phone]

## 2022-09-28 ENCOUNTER — Ambulatory Visit (HOSPITAL_COMMUNITY)
Admission: RE | Admit: 2022-09-28 | Discharge: 2022-09-28 | Disposition: A | Discharge status: Home / Self Care | Payer: Medicare Other | Source: Ambulatory Visit | Attending: Internal Medicine | Admitting: Internal Medicine

## 2022-09-28 ENCOUNTER — Encounter: Payer: Self-pay | Admitting: Cardiology

## 2022-09-28 VITALS — BP 134/80 | HR 80 | Ht 74.0 in | Wt 158.4 lb

## 2022-09-28 DIAGNOSIS — I447 Left bundle-branch block, unspecified: Secondary | ICD-10-CM | POA: Insufficient documentation

## 2022-09-28 DIAGNOSIS — I482 Chronic atrial fibrillation, unspecified: Secondary | ICD-10-CM | POA: Diagnosis not present

## 2022-09-28 DIAGNOSIS — Z7901 Long term (current) use of anticoagulants: Secondary | ICD-10-CM | POA: Insufficient documentation

## 2022-09-28 DIAGNOSIS — Z79899 Other long term (current) drug therapy: Secondary | ICD-10-CM | POA: Diagnosis not present

## 2022-09-28 DIAGNOSIS — I10 Essential (primary) hypertension: Secondary | ICD-10-CM | POA: Insufficient documentation

## 2022-09-28 DIAGNOSIS — I4892 Unspecified atrial flutter: Secondary | ICD-10-CM | POA: Insufficient documentation

## 2022-09-28 DIAGNOSIS — D6869 Other thrombophilia: Secondary | ICD-10-CM | POA: Diagnosis not present

## 2022-09-28 DIAGNOSIS — I4819 Other persistent atrial fibrillation: Secondary | ICD-10-CM | POA: Diagnosis present

## 2022-09-28 NOTE — Progress Notes (Addendum)
Primary Care Physician: Alferd Apa, MD Referring Physician:Dr. 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 His metoprolol dose was lowered earlier in the year for bradycardia in SR. He has had 4 episodes since the dose was lowered and this last episode has been persistent since last week. EKG shows atrial flutter with variable rate, with v rates in the 50's. Highest v rates have been in 80-90 bpm with exertion. He had a NICM when first dx with afib in 2008. LHC at that time showed normal coronary arteries and EF of 50%. Lat echo 03/2020, showed normal EF with severely dilated left atrium with a diameter of 4.70 and volume of 185 ml.   He denies any alcohol, tobacco, substance abuse,  excessive caffeine. He denies snoring unless he is on his back. No prior sleep study.   He has a CHA2DS2VASc score of 2(HTN, age). Dr. Jens Som started him on eliquis 5 mg bid several months ago but the pt states he did not start it as he was afraid of bleeding side effects. He states he has had 5 cardioversion's in the past, as well as some were TEE guided. He would like to have a CV asap. He voices exertional dyspnea, fatigue with being out of rhythm.    F/u in afib clinic, 07/26/19, he has returned to atrial flutter with variable block, v rates in the 60's, 5 days ago. I saw him late May, he was out of rhythm and cardioversion was planned but he self converted. There were several my chart messages that he stated that he was having fatigue on eliquis and wanted to stop it. He was told by PharmD that it was likely 2/2 the BB not eliquis. He has a h/o hep C and pharmacist at [facility] reassured him that eliquis was safe in that situation. He does not like being on meds. Very fatigued. I discussed ablation or change in antiarrythmic and he would prefer to discuss ablation. I feel even if he is  cardioverted, he may go back to afib/flutter soon, as he appears to be failing flecainide.   F/u in afib clinic, 11/13/20. He had an ablation one month ago and did maintain SR for almost 2 weeks. Since then he feels he is out of rhythm. He appears to be in an atrial flutter with variable block today. He continues on flecainide and metoprolol as well as eliquis 5 mg bid for a CHA2DS2VASc  score of 2. We discussed pursing cardioversion and he was in agreement.  F/u 10/26 in afib clinic. Had a successful cardioversion 11/20/20. He is in SR today. He remains on flecainide. No swallowing or groin issues since the procedure. Continues on eliquis. He feels better post cardioversion with return of SR.   Follow up in the AF clinic 12/18/20. Patient reports he was back in afib just a few days after his last office visit with variable heart rates. ECG today shows rate controlled atrial flutter.   F/u in afib clinic 01/08/21. He had cardioversion but had very ERAF. We discussed again need for stronger antiarrythmic, tikosyn. He has f/u with Dr. Johney Frame 12/16 and he will further discuss with him. He did tell me that he had a bad choking experience with swallowing a pill when he was a child and he currently has to chew up his pills. He was  told that he would not be able to do that with Tikosyn and would have to swallow the capsule  whole.  He remains in rate controlled atrial flutter with controlled rates. He is symptomatic with fatigue being out of rhythm.   F/u in afib clinic, 03/06/21. He is here one week s/p Tikosyn admission. He is in Sinus brady.  He feels improved.   Follow up in the AF clinic 05/21/22. Patient reports that he has done well since his last visit. He had one episode of tachypalpitations lasting ~10 minutes. No bleeding issues on anticoagulation.   F/u in Afib clinic, 08/31/22. He appears to be in atrial flutter today. Patient contacted office on 7/2 noting asymptomatic increase in HR. He states  historically he has had periodic episodes of higher HR from 10 min to 15 hours but this is the first time on Tikosyn his HR has been persistently elevated. Currently on Tikosyn 500 mcg BID. No bleeding issues on Eliquis. No missed doses of Tikosyn or Eliquis.   F/u in Afib clinic, 09/17/22. He is currently in NSR. S/p successful DCCV on 09/04/22. No missed doses of Eliquis. He feels great; he noted immediate relief of symptoms after he woke up from cardioversion. No episodes of Afib since DCCV.   F/u in Afib clinic, 09/28/22. He is currently in rate controlled atrial flutter. He went back out of rhythm several days ago. He feels tired and has SOB with activity. No missed doses of Eliquis.   Today, he denies symptoms of palpitations, chest pain, shortness of breath, orthopnea, PND, lower extremity edema, dizziness, presyncope, syncope, or neurologic sequela. The patient is tolerating medications without difficulties and is otherwise without complaint today.   Past Medical History:  Diagnosis Date   ATRIAL FIBRILLATION 06/16/2008   Qualifier: Diagnosis of  By: [name], RMA, [name]     Atrial fibrillation (HCC)    Blood in stool    Cardiomyopathy    improved    DJD (degenerative joint disease) of lumbar spine    Gout    Hepatitis C    Hx of echocardiogram    a. Echo 12/13:  EF 60-65%, mod LAE, mild RAE, PASP 34   Intrinsic asthma, unspecified    Substance abuse (HCC)    Unspecified polyarthropathy or polyarthritis, multiple sites    Past Surgical History:  Procedure Laterality Date   ATRIAL FIBRILLATION ABLATION N/A 10/16/2020   Procedure: ATRIAL FIBRILLATION ABLATION;  Surgeon: [name], MD;  Location: [facility] INVASIVE CV LAB;  Service: Cardiovascular;  Laterality: N/A;   CARDIOVERSION N/A 11/20/2020   Procedure: CARDIOVERSION;  Surgeon: Quintella Reichert, MD;  Location: [facility] ENDOSCOPY;  Service: Cardiovascular;  Laterality: N/A;   CARDIOVERSION N/A 12/31/2020   Procedure: CARDIOVERSION;   Surgeon: Chrystie Nose, MD;  Location: [facility] ENDOSCOPY;  Service: Cardiovascular;  Laterality: N/A;   CARDIOVERSION N/A 09/04/2022   Procedure: CARDIOVERSION;  Surgeon: [name], MD;  Location: [facility] INVASIVE CV LAB;  Service: Cardiovascular;  Laterality: N/A;   HERNIA REPAIR  1966    Current Outpatient Medications  Medication Sig Dispense Refill   albuterol (VENTOLIN HFA) 108 (90 Base) MCG/ACT inhaler Inhale 2 puffs into the lungs every 6 (six) hours as needed for wheezing or shortness of breath.     allopurinol (ZYLOPRIM) 100 MG tablet Take 200 mg by mouth daily.     apixaban (ELIQUIS) 5 MG TABS tablet Take 1 tablet (5 mg total) by mouth 2 (two) times daily. 60 tablet 5  B Complex-C (SUPER B COMPLEX PO) Take 1 tablet by mouth daily.     dofetilide (TIKOSYN) 500 MCG capsule Take 1 capsule (500 mcg total) by mouth 2 (two) times daily. 60 capsule 5   Glycerin (CLEAR EYES ADV DRY & ITCHY RLF) 0.25 % SOLN Place 1-2 drops into both eyes 3 (three) times daily as needed (dry and itchy eyes).     hydrocortisone cream 1 % Apply 1 application topically daily as needed for itching.     MAGNESIUM-OXIDE 400 (240 Mg) MG tablet Take 1 tablet (400 mg total) by mouth daily. 90 tablet 1   metoprolol tartrate (LOPRESSOR) 25 MG tablet Take 0.5 tablets (12.5 mg total) by mouth 2 (two) times daily. Please keep appointment for 9/924 for any further refills 60 tablet 0   Multiple Vitamin (MULTIVITAMIN WITH MINERALS) TABS tablet Take 1 tablet by mouth in the morning.     sildenafil (VIAGRA) 100 MG tablet Take 100 mg by mouth daily as needed for erectile dysfunction.     No current facility-administered medications for this encounter.    Allergies  Allergen Reactions   Indomethacin Other (See Comments)    Headaches and tired    ROS- All systems are reviewed and negative except as per the HPI above  Physical Exam: Vitals:   09/28/22 0930  BP: 134/80  Pulse: 80  Weight: 71.8 kg  Height: 6\' 2"   (1.88 m)     Wt Readings from Last 3 Encounters:  09/28/22 71.8 kg  09/17/22 72.2 kg  09/04/22 72.6 kg   Labs: Lab Results  Component Value Date   NA 139 08/31/2022   K 4.6 08/31/2022   CL 101 08/31/2022   CO2 27 08/31/2022   GLUCOSE 103 (H) 08/31/2022   BUN 20 08/31/2022   CREATININE 1.25 (H) 08/31/2022   CALCIUM 9.1 08/31/2022   MG 2.0 08/31/2022   GEN- The patient is well appearing, alert and oriented x 3 today.   Neck - no JVD or carotid bruit noted Lungs- Clear to ausculation bilaterally, normal work of breathing Heart- Irregular rate and rhythm, no murmurs, rubs or gallops, PMI not laterally displaced Extremities- no clubbing, cyanosis, or edema Skin - no rash or ecchymosis noted   EKG today demonstrates Vent. rate 80 BPM PR interval * ms QRS duration 154 ms QT/QTcB 452/521 ms P-R-T axes * 95 -32 Atrial flutter with variable A-V block Rightward axis Left bundle branch block Abnormal ECG When compared with ECG of 17-Sep-2022 13:55, PREVIOUS ECG IS PRESENT  Echo- 03/22/20 1. Left ventricular ejection fraction, by estimation, is 55 to 60%. The  left ventricle has normal function. The left ventricle has no regional  wall motion abnormalities. There is mild concentric left ventricular  hypertrophy of the posterior segment.  Left ventricular diastolic parameters are indeterminate. The average left ventricular global longitudinal strain is -19.1 %. The global longitudinal strain is normal.   2. Right ventricular systolic function is normal. The right ventricular  size is normal.   3. Left atrial size was severely dilated. LA diam 4.70 cm but with volume of 185 ml  4. Right atrial size was severely dilated.   5. The mitral valve is normal in structure. Mild mitral valve  regurgitation. No evidence of mitral stenosis.   6. Tricuspid valve regurgitation is mild to moderate.   7. The aortic valve is normal in structure. Aortic valve regurgitation is  not  visualized. No aortic stenosis is present.   8. The inferior vena  cava is normal in size with greater than 50%    CHA2DS2-VASc Score = 2  The patient's score is based upon: CHF History: 0 HTN History: 1 Diabetes History: 0 Stroke History: 0 Vascular Disease History: 0 Age Score: 1 Gender Score: 0      ASSESSMENT AND PLAN: 1. Persistent Atrial Fibrillation/atrial flutter The patient's CHA2DS2-VASc score is 2, indicating a 2.2% annual risk of stroke.   S/p ablation  10/16/20 by Dr. Johney Frame He had recurrent atrial arrhythmia's not responsive to flecainide S/p dofetilide loading 02/2021 S/p successful DCCV on 09/04/22.  He is currently in rate controlled atrial flutter.  Continue Tikosyn 500 mcg BID.   He has had ERAF, and would consider Tikosyn a failure at this point. We discussed options of repeat ablation, attempt at amiodarone medication, or continue Tikosyn for right now. He is hesitant to try amiodarone because there is no guarantee it will help him. He has history of severely dilated left atrium so may be prohibitive of repeat ablation. If an ablation is still reasonable, then can maybe consider remaining on Tikosyn for now (essentially leaving him in rate controlled atrial flutter) so that after ablation can continue on Tikosyn. He would like to speak with Dr. Elberta Fortis regarding these options which is reasonable.   2. Secondary Hypercoagulable State (ICD10:  D68.69) The patient is at significant risk for stroke/thromboembolism based upon his CHA2DS2-VASc Score of 2.  Continue Apixaban (Eliquis).  No missed doses.  3. HTN Stable today.  4. LBBB New finding on ECG although it does appear that he has had aberrant conduction/rate dependent block in the past. He denies any symptoms of chest pain, SOB, edema, or orthopnea.  CAC score on CT in 2022 was 0. Echo 2022 showed normal LV function. D/w EP, given recent normal testing and paucity of symptoms, will monitor for now. If he has any  symptoms would have low threshold to repeat echo.    Follow up with Dr. Elberta Fortis.    Lake Bells, PA-C Afib Clinic [facility] [address] [place], [place] [address] [phone]

## 2022-10-01 NOTE — Progress Notes (Signed)
HPI: FU nonischemic cardiomyopathy and atrial fibrillation. Cardiomyopathy was likely tachycardia mediated. LHC 3/08: Normal coronary arteries, EF 50%. Also with history of substance abuse. Most recent echocardiogram February 2022 showed normal LV function, mild left ventricular hypertrophy, severe biatrial enlargement, mild mitral regurgitation, mild to moderate tricuspid regurgitation.  Abdominal ultrasound April 2022 showed no evidence of aneurysm.  Had atrial fibrillation ablation September 2022.  He has had recurrent atrial fibrillation following his ablation and Tikosyn initiated.  Had cardioversion of atrial flutter September 04, 2022.  However atrial flutter recurred and he is scheduled to see Dr. Elberta Fortis about repeat ablation.  Since last seen, patient denies dyspnea, chest pain, palpitations, syncope or bleeding.  Current Outpatient Medications  Medication Sig Dispense Refill   albuterol (VENTOLIN HFA) 108 (90 Base) MCG/ACT inhaler Inhale 2 puffs into the lungs every 6 (six) hours as needed for wheezing or shortness of breath.     allopurinol (ZYLOPRIM) 100 MG tablet Take 200 mg by mouth daily.     apixaban (ELIQUIS) 5 MG TABS tablet Take 1 tablet (5 mg total) by mouth 2 (two) times daily. 60 tablet 5   B Complex-C (SUPER B COMPLEX PO) Take 1 tablet by mouth daily.     dofetilide (TIKOSYN) 500 MCG capsule Take 1 capsule (500 mcg total) by mouth 2 (two) times daily. 60 capsule 5   Glycerin (CLEAR EYES ADV DRY & ITCHY RLF) 0.25 % SOLN Place 1-2 drops into both eyes 3 (three) times daily as needed (dry and itchy eyes).     hydrocortisone cream 1 % Apply 1 application topically daily as needed for itching.     MAGNESIUM-OXIDE 400 (240 Mg) MG tablet Take 1 tablet (400 mg total) by mouth daily. 90 tablet 1   metoprolol tartrate (LOPRESSOR) 25 MG tablet Take 0.5 tablets (12.5 mg total) by mouth 2 (two) times daily. Please keep appointment for 9/924 for any further refills 60 tablet 0   Multiple  Vitamin (MULTIVITAMIN WITH MINERALS) TABS tablet Take 1 tablet by mouth in the morning.     sildenafil (VIAGRA) 100 MG tablet Take 100 mg by mouth daily as needed for erectile dysfunction.     No current facility-administered medications for this visit.     Past Medical History:  Diagnosis Date   ATRIAL FIBRILLATION 06/16/2008   Qualifier: Diagnosis of  By: [name], RMA, [name]     Atrial fibrillation (HCC)    Blood in stool    Cardiomyopathy    improved    DJD (degenerative joint disease) of lumbar spine    Gout    Hepatitis C    Hx of echocardiogram    a. Echo 12/13:  EF 60-65%, mod LAE, mild RAE, PASP 34   Intrinsic asthma, unspecified    Substance abuse (HCC)    Unspecified polyarthropathy or polyarthritis, multiple sites     Past Surgical History:  Procedure Laterality Date   ATRIAL FIBRILLATION ABLATION N/A 10/16/2020   Procedure: ATRIAL FIBRILLATION ABLATION;  Surgeon: [name], MD;  Location: [facility] INVASIVE CV LAB;  Service: Cardiovascular;  Laterality: N/A;   CARDIOVERSION N/A 11/20/2020   Procedure: CARDIOVERSION;  Surgeon: Quintella Reichert, MD;  Location: [facility] ENDOSCOPY;  Service: Cardiovascular;  Laterality: N/A;   CARDIOVERSION N/A 12/31/2020   Procedure: CARDIOVERSION;  Surgeon: Chrystie Nose, MD;  Location: [facility] ENDOSCOPY;  Service: Cardiovascular;  Laterality: N/A;   CARDIOVERSION N/A 09/04/2022   Procedure: CARDIOVERSION;  Surgeon: [name], MD;  Location: [facility] INVASIVE  CV LAB;  Service: Cardiovascular;  Laterality: N/A;   HERNIA REPAIR  1966    Social History   Socioeconomic History   Marital status: Married    Spouse name: Not on file   Number of children: Not on file   Years of education: Not on file   Highest education level: Not on file  Occupational History   Not on file  Tobacco Use   Smoking status: Former    Current packs/day: 0.00    Types: Cigarettes    Quit date: 02/02/2014    Years since quitting: 8.6   Smokeless tobacco:  Never   Tobacco comments:    started in 1999. smoked 1/4 ppd   Substance and Sexual Activity   Alcohol use: No    Alcohol/week: 0.0 standard drinks of alcohol    Comment: quit in 2006    Drug use: No    Comment: quit in 2006    Sexual activity: Not on file  Other Topics Concern   Not on file  Social History Narrative   Married; step children; disabled.    Social Determinants of Health   Financial Resource Strain: Medium Risk (09/20/2021)   Received from [facility], [facility]   Overall Financial Resource Strain (CARDIA)    Difficulty of Paying Living Expenses: Somewhat hard  Food Insecurity: Food Insecurity Present (09/20/2021)   [facility], [facility]   Hunger Vital Sign    Worried About Running Out of Food in the Last Year: Sometimes true    Ran Out of Food in the Last Year: Patient declined  Transportation Needs: Unknown (09/20/2021)   Received from [facility], [facility]   [facility] - Transportation    Lack of Transportation (Medical): Not on file    Lack of Transportation (Non-Medical): Patient declined  Physical Activity: Sufficiently Active (09/20/2021)   Received from [facility], [facility]   Exercise Vital Sign    Days of Exercise per Week: 4 days    Minutes of Exercise per Session: 40 min  Stress: No Stress Concern Present (09/20/2021)   Received from [facility], [facility] - Occupational Stress Questionnaire    Feeling of Stress : Not at all  Social Connections: Unknown (09/24/2022)   Received from [facility]   Social Network    Social Network: Not on file  Intimate Partner Violence: Unknown (09/24/2022)   Received from [facility]   HITS    Physically Hurt: Not on file    Insult or Talk Down To: Not on file    Threaten Physical Harm: Not on file    Scream or Curse: Not on file    Family History  Problem Relation Age of Onset   Heart failure Other        CHF - father  and mother. (mother also had severe RA)    ROS: no fevers or chills, productive cough, hemoptysis, dysphasia, odynophagia, melena, hematochezia, dysuria, hematuria, rash, seizure activity, orthopnea, PND, pedal edema, claudication. Remaining systems are negative.  Physical Exam: Well-developed well-nourished in no acute distress.  Skin is warm and dry.  HEENT is normal.  Neck is supple.  Chest is clear to auscultation with normal expansion.  Cardiovascular exam is regular rate and rhythm.  Abdominal exam nontender or distended. No masses palpated. Extremities show no edema. neuro grossly intact  EKG Interpretation Date/Time:  Monday October 12 2022 14:56:03 EDT Ventricular Rate:  61 PR Interval:  198 QRS  Duration:  156 QT Interval:  496 QTC Calculation: 499 R Axis:   37  Text Interpretation: Normal sinus rhythm Left bundle branch block When compared with ECG of 28-Sep-2022 09:38, Sinus rhythm has replaced Atrial flutter Questionable change in QRS axis T wave inversion no longer evident in Inferior leads T wave inversion no longer evident in Lateral leads Confirmed by Olga Millers ([id number]) on 10/12/2022 2:57:30 PM    A/P  1 paroxysmal atrial fibrillation/flutter-patient remains in sinus rhythm status post recent cardioversion.  He is scheduled to see Dr. Elberta Fortis concerning repeat ablation.  Will continue Tikosyn, metoprolol and apixaban at this point.  2 hypertension-patient's blood pressure is elevated; however he follows this at home and it is typically controlled.  Continue present medications and follow.  3 history of substance abuse-he has abstained by his report.  Olga Millers, MD

## 2022-10-12 ENCOUNTER — Encounter: Payer: Self-pay | Admitting: Cardiology

## 2022-10-12 ENCOUNTER — Ambulatory Visit (INDEPENDENT_AMBULATORY_CARE_PROVIDER_SITE_OTHER): Payer: Medicare Other | Admitting: Cardiology

## 2022-10-12 VITALS — BP 165/82 | HR 61 | Ht 74.0 in | Wt 158.1 lb

## 2022-10-12 DIAGNOSIS — R9431 Abnormal electrocardiogram [ECG] [EKG]: Secondary | ICD-10-CM | POA: Diagnosis not present

## 2022-10-12 DIAGNOSIS — I4892 Unspecified atrial flutter: Secondary | ICD-10-CM

## 2022-10-12 DIAGNOSIS — I10 Essential (primary) hypertension: Secondary | ICD-10-CM | POA: Diagnosis not present

## 2022-10-12 DIAGNOSIS — I4819 Other persistent atrial fibrillation: Secondary | ICD-10-CM

## 2022-10-12 NOTE — Patient Instructions (Signed)

## 2022-10-14 ENCOUNTER — Telehealth: Payer: Self-pay

## 2022-10-14 NOTE — Telephone Encounter (Signed)
LM for pt to call back to schedule Afib Ablation with Dr. Elberta Fortis. We have a few openings in October that I can offer him.

## 2022-10-14 NOTE — Telephone Encounter (Signed)
LM for pt to call back to schedule Ablation

## 2022-10-15 ENCOUNTER — Other Ambulatory Visit (INDEPENDENT_AMBULATORY_CARE_PROVIDER_SITE_OTHER): Payer: Self-pay | Admitting: Cardiology

## 2022-10-16 ENCOUNTER — Other Ambulatory Visit: Payer: Self-pay

## 2022-10-16 MED ORDER — METOPROLOL TARTRATE 25 MG PO TABS: 12.5000 mg | ORAL_TABLET | Freq: Two times a day (BID) | ORAL | 3 refills | Status: DC

## 2022-11-03 ENCOUNTER — Encounter: Payer: Self-pay | Admitting: Cardiology

## 2022-11-05 NOTE — Telephone Encounter (Signed)
Left message to call back  

## 2022-11-09 NOTE — Telephone Encounter (Signed)
Left message to call back  

## 2022-11-11 ENCOUNTER — Encounter: Payer: Self-pay | Admitting: Cardiology

## 2022-11-11 ENCOUNTER — Ambulatory Visit: Payer: Medicare Other | Admitting: Cardiology

## 2022-11-11 ENCOUNTER — Ambulatory Visit: Payer: Medicare Other | Attending: Cardiology | Admitting: Cardiology

## 2022-11-11 VITALS — BP 162/80 | HR 60 | Ht 74.0 in | Wt 161.0 lb

## 2022-11-11 DIAGNOSIS — I484 Atypical atrial flutter: Secondary | ICD-10-CM | POA: Diagnosis not present

## 2022-11-11 DIAGNOSIS — D6869 Other thrombophilia: Secondary | ICD-10-CM

## 2022-11-11 DIAGNOSIS — I10 Essential (primary) hypertension: Secondary | ICD-10-CM | POA: Diagnosis not present

## 2022-11-11 DIAGNOSIS — I4819 Other persistent atrial fibrillation: Secondary | ICD-10-CM

## 2022-11-11 NOTE — Patient Instructions (Signed)
Medication Instructions:  Your physician recommends that you continue on your current medications as directed. Please refer to the Current Medication list given to you today.  *If you need a refill on your cardiac medications before your next appointment, please call your pharmacy*   Lab Work: None ordered   Testing/Procedures: None ordered   Follow-Up: At [facility], you and your health needs are our priority.  As part of our continuing mission to provide you with exceptional heart care, we have created designated Provider Care Teams.  These Care Teams include your primary Cardiologist (physician) and Advanced Practice Providers (APPs -  Physician Assistants and Nurse Practitioners) who all work together to provide you with the care you need, when you need it.  Your next appointment:   6 month(s)  The format for your next appointment:   In Person  Provider:   You will see one of the following Advanced Practice Providers on your designated Care Team:   Francis Dowse, [place]" [place], [place] Canary Brim, NP {   Thank you for choosing [facility]!!   Dory Horn, RN [phone]  Other Instructions  Cardiac Ablation Cardiac ablation is a procedure to destroy (ablate) heart tissue that is sending bad signals. These bad signals cause the heart to beat very fast or in a way that is not normal. Destroying some tissues can help make the heart rhythm normal. Tell your doctor about: Any allergies you have. All medicines you are taking. These include vitamins, herbs, eye drops, creams, and over-the-counter medicines. Any problems you or family members have had with anesthesia. Any bleeding problems you have. Any surgeries you have had. Any medical conditions you have. Whether you are pregnant or may be pregnant. What are the risks? Your doctor will talk with you about risks. These may include: Infection. Bruising and bleeding. Stroke or blood  clots. Damage to nearby areas of your body. Allergies to medicines or dyes. Needing a pacemaker if the heart gets damaged. A pacemaker helps the heart beat normally. The procedure not working. What happens before the procedure? Medicines Ask your doctor about changing or stopping: Your normal medicines. Vitamins, herbs, and supplements. Over-the-counter medicines. Do not take aspirin or ibuprofen unless you are told to. General instructions Follow instructions from your doctor about what you may eat and drink. If you will be going home right after the procedure, plan to have a responsible adult: Take you home from the hospital or clinic. You will not be allowed to drive. Care for you for the time you are told. Ask your doctor what steps will be taken to prevent the spread of germs. What happens during the procedure?  An IV tube will be put into one of your veins. You may be given: A sedative. This helps you relax. Anesthesia. This will: Numb certain areas of your body. The skin on your neck or groin will be numbed. A cut (incision) will be made in your neck or groin. A needle will be put through the cut and into a large vein. The small, thin tube (catheter) will be put into the needle. The tube will be moved to your heart. A type of X-ray (fluoroscopy) will be used to help guide the tube. It will also show constant images of the heart on a screen. Dye may be put through the tube. This helps your doctor see your heart. An electric current will be sent from the tube to destroy heart tissue in certain areas. The tube  will be taken out. Pressure will be held on your cut. This helps stop bleeding. A bandage (dressing) will be put over your cut. The procedure may vary among doctors and hospitals. What happens after the procedure? You will be monitored until you leave the hospital or clinic. This includes checking your blood pressure, heart rate and rhythm, breathing rate, and blood  oxygen level. Your cut will be checked for bleeding. You will need to lie still for a few hours. If your groin was used, you will need to keep your leg straight for a few hours after the small, thin tube is removed. This information is not intended to replace advice given to you by your health care provider. Make sure you discuss any questions you have with your health care provider. Document Revised: 07/08/2021 Document Reviewed: 07/08/2021 Elsevier Patient Education  2024 [employer].

## 2022-11-11 NOTE — Progress Notes (Signed)
Electrophysiology Office Note:   Date:  11/11/2022  ID:  Scott Wagner, DOB [birth year], MRN [id number]  Primary Cardiologist: Scott Millers, MD Electrophysiologist: Scott Raphael Jorja Loa, MD      History of Present Illness:   Scott Wagner is a 67 y.o. male with h/o atrial fibrillation, nonischemic cardiomyopathy, hepatitis C seen today for routine electrophysiology followup.   He is post atrial fibrillation ablation September 2022.  He had recurrent episodes of atrial fibrillation and has been loaded on dofetilide.  He then followed up in A-fib clinic and was noted to be in rate controlled atrial flutter.  Since last being seen in our clinic the patient reports doing overall well.  He has had 1 further episode of atrial flutter, but converted to sinus rhythm without intervention.  He is potentially ready for ablation, but would like to think it over.  he denies chest pain, palpitations, dyspnea, PND, orthopnea, nausea, vomiting, dizziness, syncope, edema, weight gain, or early satiety.   Review of systems complete and found to be negative unless listed in HPI.   EP Information / Studies Reviewed:    EKG is not ordered today. EKG from 09/04/02 reviewed which showed atrial flutter        Risk Assessment/Calculations:    CHA2DS2-VASc Score = 2   This indicates a 2.2% annual risk of stroke. The patient's score is based upon: CHF History: 0 HTN History: 1 Diabetes History: 0 Stroke History: 0 Vascular Disease History: 0 Age Score: 1 Gender Score: 0            Physical Exam:   VS:  BP (!) 162/80   Pulse 60   Ht 6\' 2"  (1.88 m)   Wt 161 lb (73 kg)   SpO2 96%   BMI 20.67 kg/m    Wt Readings from Last 3 Encounters:  11/11/22 161 lb (73 kg)  10/12/22 158 lb 1.9 oz (71.7 kg)  09/28/22 158 lb 6.4 oz (71.8 kg)     GEN: Well nourished, well developed in no acute distress NECK: No JVD; No carotid bruits CARDIAC: Regular rate and rhythm, no murmurs, rubs,  gallops RESPIRATORY:  Clear to auscultation without rales, wheezing or rhonchi  ABDOMEN: Soft, non-tender, non-distended EXTREMITIES:  No edema; No deformity   ASSESSMENT AND PLAN:    1.  Persistent atrial fibrillation/flutter: Post ablation September 2022.  Currently on dofetilide.  He is unfortunately continued to have episodes of atrial fibrillation.  He has also had what appears to be an atypical atrial flutter.  He would likely benefit from ablation.  Risk and benefits were discussed.  He understands the risks and likely Scott Wagner agree to the procedure.  He Scott Wagner call Scott Wagner back with an answer.  Risk, benefits, and alternatives to EP study and radiofrequency/pulse field ablation for afib were also discussed in detail today. These risks include but are not limited to stroke, bleeding, vascular damage, tamponade, perforation, damage to the esophagus, lungs, and other structures, pulmonary vein stenosis, worsening renal function, and death. The patient understands these risk and wishes to proceed.  We Mallarie Wagner therefore proceed with catheter ablation at the next available time.  Carto, ICE, anesthesia are requested for the procedure.  Scott Wagner also obtain CT PV protocol prior to the procedure to exclude LAA thrombus and further evaluate atrial anatomy.  2.  Secondary hypercoagulable state: Currently on Eliquis for atrial fibrillation  3.  Hypertension: Elevated today.  Followed by primary physician.  Follow up with EP APP in 6 months  Signed, Jadon Ressler Jorja Loa, MD

## 2022-11-19 ENCOUNTER — Encounter: Payer: Self-pay | Admitting: Cardiology

## 2022-11-20 ENCOUNTER — Other Ambulatory Visit: Payer: Self-pay

## 2022-11-20 MED ORDER — MAGNESIUM-OXIDE 400 (240 MG) MG PO TABS: 1.0000 | ORAL_TABLET | Freq: Every day | ORAL | 3 refills | Status: DC

## 2022-11-26 ENCOUNTER — Ambulatory Visit (HOSPITAL_COMMUNITY): Payer: Medicare Other | Admitting: Physician Assistant

## 2022-12-03 ENCOUNTER — Encounter: Payer: Self-pay | Admitting: Cardiology

## 2022-12-03 MED ORDER — DOFETILIDE 500 MCG PO CAPS: 500.0000 ug | ORAL_CAPSULE | Freq: Two times a day (BID) | ORAL | 5 refills | Status: DC

## 2022-12-22 ENCOUNTER — Encounter: Payer: Self-pay | Admitting: Cardiology

## 2023-03-04 ENCOUNTER — Encounter: Payer: Self-pay | Admitting: Cardiology

## 2023-03-07 ENCOUNTER — Other Ambulatory Visit: Payer: Self-pay | Admitting: Cardiology

## 2023-03-08 NOTE — Telephone Encounter (Signed)
Prescription refill request for Eliquis received. Indication:afib Last office visit:10/24 Scr:1.25  7/24 Age: 68 Weight:73  kg  Prescription refilled

## 2023-03-22 ENCOUNTER — Ambulatory Visit (HOSPITAL_COMMUNITY): Payer: Medicare Other | Admitting: Internal Medicine

## 2023-05-26 ENCOUNTER — Encounter: Payer: Self-pay | Admitting: Cardiology

## 2023-06-05 ENCOUNTER — Other Ambulatory Visit: Payer: Self-pay | Admitting: Cardiology

## 2023-06-07 ENCOUNTER — Other Ambulatory Visit: Payer: Self-pay | Admitting: *Deleted

## 2023-06-07 ENCOUNTER — Other Ambulatory Visit (HOSPITAL_COMMUNITY): Payer: Self-pay | Admitting: *Deleted

## 2023-06-07 MED ORDER — DOFETILIDE 500 MCG PO CAPS: 500.0000 ug | ORAL_CAPSULE | Freq: Two times a day (BID) | ORAL | 4 refills | Status: DC

## 2023-07-12 ENCOUNTER — Ambulatory Visit: Admitting: Student

## 2023-07-16 ENCOUNTER — Ambulatory Visit: Admitting: Pulmonary Disease

## 2023-07-20 NOTE — Progress Notes (Deleted)
 HPI: FU nonischemic cardiomyopathy and atrial fibrillation. Cardiomyopathy was likely tachycardia mediated. LHC 3/08: Normal coronary arteries, EF 50%. Also with history of substance abuse. Most recent echocardiogram February 2022 showed normal LV function, mild left ventricular hypertrophy, severe biatrial enlargement, mild mitral regurgitation, mild to moderate tricuspid regurgitation.  Abdominal ultrasound April 2022 showed no evidence of aneurysm.  Had atrial fibrillation ablation September 2022.  He has had recurrent atrial fibrillation following his ablation and Tikosyn  initiated.  Had cardioversion of atrial flutter September 04, 2022.  However atrial flutter recurred.  He was seen by Dr. Lawana Pray in October and repeat ablation was offered.  Since last seen,   Current Outpatient Medications  Medication Sig Dispense Refill   albuterol (VENTOLIN HFA) 108 (90 Base) MCG/ACT inhaler Inhale 2 puffs into the lungs every 6 (six) hours as needed for wheezing or shortness of breath.     allopurinol (ZYLOPRIM) 100 MG tablet Take 200 mg by mouth daily.     apixaban  (ELIQUIS ) 5 MG TABS tablet Take 1 tablet by mouth twice daily 60 tablet 5   B Complex-C (SUPER B COMPLEX PO) Take 1 tablet by mouth daily.     dofetilide  (TIKOSYN ) 500 MCG capsule Take 1 capsule (500 mcg total) by mouth 2 (two) times daily. 60 capsule 4   Glycerin (CLEAR EYES ADV DRY & ITCHY RLF) 0.25 % SOLN Place 1-2 drops into both eyes 3 (three) times daily as needed (dry and itchy eyes).     hydrocortisone cream 1 % Apply 1 application topically daily as needed for itching.     MAGNESIUM -OXIDE 400 (240 Mg) MG tablet Take 1 tablet (400 mg total) by mouth daily. 90 tablet 3   metoprolol  tartrate (LOPRESSOR ) 25 MG tablet Take 0.5 tablets (12.5 mg total) by mouth 2 (two) times daily. 90 tablet 3   Multiple Vitamin (MULTIVITAMIN WITH MINERALS) TABS tablet Take 1 tablet by mouth in the morning.     sildenafil (VIAGRA) 100 MG tablet Take 100 mg  by mouth daily as needed for erectile dysfunction.     No current facility-administered medications for this visit.     Past Medical History:  Diagnosis Date   ATRIAL FIBRILLATION 06/16/2008   Qualifier: Diagnosis of  By: Micael Adas, RMA, [name]     Atrial fibrillation (HCC)    Blood in stool    Cardiomyopathy    improved    DJD (degenerative joint disease) of lumbar spine    Gout    Hepatitis C    Hx of echocardiogram    a. Echo 12/13:  EF 60-65%, mod LAE, mild RAE, PASP 34   Intrinsic asthma, unspecified    Substance abuse (HCC)    Unspecified polyarthropathy or polyarthritis, multiple sites     Past Surgical History:  Procedure Laterality Date   ATRIAL FIBRILLATION ABLATION N/A 10/16/2020   Procedure: ATRIAL FIBRILLATION ABLATION;  Surgeon: Jolly Needle, MD;  Location: [facility] INVASIVE CV LAB;  Service: Cardiovascular;  Laterality: N/A;   CARDIOVERSION N/A 11/20/2020   Procedure: CARDIOVERSION;  Surgeon: Jacqueline Matsu, MD;  Location: [facility] ENDOSCOPY;  Service: Cardiovascular;  Laterality: N/A;   CARDIOVERSION N/A 12/31/2020   Procedure: CARDIOVERSION;  Surgeon: Hazle Lites, MD;  Location: [facility] ENDOSCOPY;  Service: Cardiovascular;  Laterality: N/A;   CARDIOVERSION N/A 09/04/2022   Procedure: CARDIOVERSION;  Surgeon: Wendie Hamburg, MD;  Location: [facility] INVASIVE CV LAB;  Service: Cardiovascular;  Laterality: N/A;   HERNIA REPAIR  1966    Social History  Socioeconomic History   Marital status: Married    Spouse name: Not on file   Number of children: Not on file   Years of education: Not on file   Highest education level: Not on file  Occupational History   Not on file  Tobacco Use   Smoking status: Former    Current packs/day: 0.00    Types: Cigarettes    Quit date: 02/02/2014    Years since quitting: 9.4   Smokeless tobacco: Never   Tobacco comments:    started in 1999. smoked 1/4 ppd   Substance and Sexual Activity   Alcohol  use: No    Alcohol /week: 0.0  standard drinks of alcohol     Comment: quit in 2006    Drug use: No    Comment: quit in 2006    Sexual activity: Not on file  Other Topics Concern   Not on file  Social History Narrative   Married; step children; disabled.    Social Drivers of Corporate investment banker Strain: Low Risk  (07/19/2023)   Received from [facility]   Overall Financial Resource Strain (CARDIA)    Difficulty of Paying Living Expenses: Not hard at all  Food Insecurity: No Food Insecurity (07/19/2023)   Received [facility]   Hunger Vital Sign    Within the past 12 months, you worried that your food would run out before you got the money to buy more.: Never true    Within the past 12 months, the food you bought just didn't last and you didn't have money to get more.: Never true  Transportation Needs: No Transportation Needs (07/19/2023)   Received from [facility] - Transportation    Lack of Transportation (Medical): No    Lack of Transportation (Non-Medical): No  Physical Activity: Sufficiently Active (07/19/2023)   Received from [facility]   Exercise Vital Sign    On average, how many days per week do you engage in moderate to strenuous exercise (like a brisk walk)?: 3 days    On average, how many minutes do you engage in exercise at this level?: 90 min  Stress: No Stress Concern Present (07/19/2023)   Received from [facility]ational [facility] - Occupational Stress Questionnaire    Feeling of Stress : Not at all  Social Connections: Patient Declined (07/19/2023)   Received from [facility]   Social Network    How would you rate your social network (family, work, friends)?: Patient declined  Intimate Partner Violence: Not At Risk (07/19/2023)   Received from [facility]   HITS    Over the last 12 months how often did your partner physically hurt you?: Never    Over the last 12 months how often did your partner insult you or talk down to you?:  Never    Over the last 12 months how often did your partner threaten you with physical harm?: Never    Over the last 12 months how often did your partner scream or curse at you?: Never    Family History  Problem Relation Age of Onset   Heart failure Other        CHF - father and mother. (mother also had severe RA)    ROS: no fevers or chills, productive cough, hemoptysis, dysphasia, odynophagia, melena, hematochezia, dysuria, hematuria, rash, seizure activity, orthopnea, PND, pedal edema, claudication. Remaining systems are negative.  Physical Exam: Well-developed well-nourished in no acute distress.  Skin  is warm and dry.  HEENT is normal.  Neck is supple.  Chest is clear to auscultation with normal expansion.  Cardiovascular exam is regular rate and rhythm.  Abdominal exam nontender or distended. No masses palpated. Extremities show no edema. neuro grossly intact  ECG- personally reviewed  A/P  1 paroxysmal atrial fibrillation/flutter-continue present dose of metoprolol , apixaban  and Tikosyn .  2 hypertension-blood pressure is controlled.  Continue present medical regimen.  Alexandria Angel, MD

## 2023-07-21 ENCOUNTER — Ambulatory Visit: Attending: Cardiology | Admitting: Cardiology

## 2023-07-21 ENCOUNTER — Encounter: Payer: Self-pay | Admitting: Cardiology

## 2023-07-21 VITALS — BP 130/74 | HR 61 | Ht 74.0 in | Wt 158.0 lb

## 2023-07-21 DIAGNOSIS — I4819 Other persistent atrial fibrillation: Secondary | ICD-10-CM

## 2023-07-21 DIAGNOSIS — I484 Atypical atrial flutter: Secondary | ICD-10-CM

## 2023-07-21 DIAGNOSIS — Z5181 Encounter for therapeutic drug level monitoring: Secondary | ICD-10-CM | POA: Diagnosis not present

## 2023-07-21 DIAGNOSIS — D6869 Other thrombophilia: Secondary | ICD-10-CM | POA: Diagnosis not present

## 2023-07-21 DIAGNOSIS — Z79899 Other long term (current) drug therapy: Secondary | ICD-10-CM

## 2023-07-21 MED ORDER — METOPROLOL TARTRATE 25 MG PO TABS: 25.0000 mg | ORAL_TABLET | Freq: Two times a day (BID) | ORAL | 3 refills | Status: AC

## 2023-07-21 NOTE — Progress Notes (Signed)
  Electrophysiology Office Note:   Date:  07/21/2023  ID:  Scott Wagner, DOB 22-Jul-1955, MRN [id number]  Primary Cardiologist: Alexandria Angel, MD Primary Heart Failure: None Electrophysiologist: Elonda Giuliano Cortland Ding, MD      History of Present Illness:   Scott Wagner is a 68 y.o. male with h/o atrial fibrillation, chronic systolic heart failure, hepatitis C seen today for routine electrophysiology followup.   Since last being seen in our clinic the patient reports doing well.  He has no chest pain or shortness of breath.  He has had some palpitations.  He had multiple episodes when he was sick with a respiratory virus and fevers.  He has had 1 other episode.  They have all been short-lived.  He has no acute complaints at this time.  he denies chest pain, palpitations, dyspnea, PND, orthopnea, nausea, vomiting, dizziness, syncope, edema, weight gain, or early satiety.   Review of systems complete and found to be negative unless listed in HPI.   EP Information / Studies Reviewed:    EKG is ordered today. Personal review as below.  EKG Interpretation Date/Time:  Wednesday July 21 2023 12:26:58 EDT Ventricular Rate:  61 PR Interval:  204 QRS Duration:  154 QT Interval:  516 QTC Calculation: 519 R Axis:   63  Text Interpretation: Normal sinus rhythm Left bundle branch block When compared with ECG of 12-Oct-2022 14:56, No significant change was found Confirmed by Sohrab Keelan ([id number]) on 07/21/2023 12:31:14 PM     Risk Assessment/Calculations:    CHA2DS2-VASc Score = 2   This indicates a 2.2% annual risk of stroke. The patient's score is based upon: CHF History: 0 HTN History: 1 Diabetes History: 0 Stroke History: 0 Vascular Disease History: 0 Age Score: 1 Gender Score: 0            Physical Exam:   VS:  BP 130/74 (BP Location: Right Arm, Patient Position: Sitting, Cuff Size: Normal)   Pulse 61   Ht 6' 2 (1.88 m)   Wt 158 lb (71.7 kg)   SpO2 94%   BMI 20.29  kg/m    Wt Readings from Last 3 Encounters:  07/21/23 158 lb (71.7 kg)  11/11/22 161 lb (73 kg)  10/12/22 158 lb 1.9 oz (71.7 kg)     GEN: Well nourished, well developed in no acute distress NECK: No JVD; No carotid bruits CARDIAC: Regular rate and rhythm, no murmurs, rubs, gallops RESPIRATORY:  Clear to auscultation without rales, wheezing or rhonchi  ABDOMEN: Soft, non-tender, non-distended EXTREMITIES:  No edema; No deformity   ASSESSMENT AND PLAN:    1.  Persistent atrial fibrillation/flutter: Post ablation September 2022.  On dofetilide .  Has continued to have episodes of atrial fibrillation and flutter.  He is on dofetilide  which is overall controlling his arrhythmia burden.  [name] continue with current management.  I did discuss repeat ablation with him.  He [name] think about it and let us  know.  2.  Secondary hypercoagulable state: On Eliquis   3.  Hypertension: Well-controlled  4.  High-risk medication monitoring: QTc remained stable on dofetilide .  [name] check BMP and magnesium  today. Follow up with Afib Clinic in 6 months  Signed, Vanita Cannell Cortland Ding, MD

## 2023-07-21 NOTE — Patient Instructions (Signed)
 Medication Instructions:  Your physician has recommended you make the following change in your medication:  INCREASE Lopressor  to 25 mg twice a day  *If you need a refill on your cardiac medications before your next appointment, please call your pharmacy*  Lab Work: Today: BMET & Magnesium   If you have any lab test that is abnormal or we need to change your treatment, we will call you to review the results.  Testing/Procedures: None ordered  Follow-Up: At [facility], you and your health needs are our priority.  As part of our continuing mission to provide you with exceptional heart care, our providers are all part of one team.  This team includes your primary Cardiologist (physician) and Advanced Practice Providers or APPs (Physician Assistants and Nurse Practitioners) who all work together to provide you with the care you need, when you need it.  Your next appointment:   6 month(s)  Provider:   You will follow up in the Atrial Fibrillation Clinic located at [facility]. Your provider will be: Clint R. Fenton, PA-C or Minnie Amber, PA-C     Thank you for choosing [employer]!!   Reece Cane, RN [phone]

## 2023-07-22 LAB — BASIC METABOLIC PANEL WITH GFR
BUN/Creatinine Ratio: 20 (ref 10–24)
BUN: 22 mg/dL (ref 8–27)
CO2: 24 mmol/L (ref 20–29)
Calcium: 9.1 mg/dL (ref 8.6–10.2)
Chloride: 106 mmol/L (ref 96–106)
Creatinine, Ser: 1.12 mg/dL (ref 0.76–1.27)
Glucose: 96 mg/dL (ref 70–99)
Potassium: 4.7 mmol/L (ref 3.5–5.2)
Sodium: 145 mmol/L — ABNORMAL HIGH (ref 134–144)
eGFR: 72 mL/min/{1.73_m2} (ref >59)

## 2023-07-22 LAB — MAGNESIUM: Magnesium: 2.1 mg/dL (ref 1.6–2.3)

## 2023-07-28 ENCOUNTER — Ambulatory Visit: Payer: Medicare Other | Admitting: Cardiology

## 2023-09-03 ENCOUNTER — Encounter: Payer: Self-pay | Admitting: Cardiology

## 2023-09-03 ENCOUNTER — Other Ambulatory Visit: Payer: Self-pay | Admitting: Cardiology

## 2023-09-03 DIAGNOSIS — I4819 Other persistent atrial fibrillation: Secondary | ICD-10-CM

## 2023-09-03 NOTE — Telephone Encounter (Signed)
 Eliquis  5mg  refill request received. Patient is 68 years old, weight-71.7kg, Crea-1.12 on 07/21/23, Diagnosis-Afib, and last seen by Dr. Inocencio on 07/21/23. Dose is appropriate based on dosing criteria. Will send in refill to requested pharmacy.

## 2023-10-17 ENCOUNTER — Encounter: Payer: Self-pay | Admitting: Cardiology

## 2023-10-20 NOTE — Progress Notes (Deleted)
 HPI: FU nonischemic cardiomyopathy and atrial fibrillation. Cardiomyopathy was likely tachycardia mediated. LHC 3/08: Normal coronary arteries, EF 50%. Also with history of substance abuse. Most recent echocardiogram February 2022 showed normal LV function, mild left ventricular hypertrophy, severe biatrial enlargement, mild mitral regurgitation, mild to moderate tricuspid regurgitation.  Abdominal ultrasound April 2022 showed no evidence of aneurysm.  Had atrial fibrillation ablation September 2022.  He has had recurrent atrial fibrillation following his ablation and Tikosyn  initiated.  Had cardioversion of atrial flutter September 04, 2022.  However atrial flutter recurred and he is scheduled to see Dr. Inocencio about repeat ablation.  Since last seen,    Current Outpatient Medications  Medication Sig Dispense Refill   albuterol (VENTOLIN HFA) 108 (90 Base) MCG/ACT inhaler Inhale 2 puffs into the lungs every 6 (six) hours as needed for wheezing or shortness of breath.     allopurinol (ZYLOPRIM) 100 MG tablet Take 200 mg by mouth daily.     apixaban  (ELIQUIS ) 5 MG TABS tablet Take 1 tablet by mouth twice daily 60 tablet 5   B Complex-C (SUPER B COMPLEX PO) Take 1 tablet by mouth daily.     dofetilide  (TIKOSYN ) 500 MCG capsule Take 1 capsule (500 mcg total) by mouth 2 (two) times daily. 60 capsule 4   Glycerin (CLEAR EYES ADV DRY & ITCHY RLF) 0.25 % SOLN Place 1-2 drops into both eyes 3 (three) times daily as needed (dry and itchy eyes).     hydrocortisone cream 1 % Apply 1 application topically daily as needed for itching.     MAGNESIUM -OXIDE 400 (240 Mg) MG tablet Take 1 tablet (400 mg total) by mouth daily. 90 tablet 3   metoprolol  tartrate (LOPRESSOR ) 25 MG tablet Take 1 tablet (25 mg total) by mouth 2 (two) times daily. 180 tablet 3   Multiple Vitamin (MULTIVITAMIN WITH MINERALS) TABS tablet Take 1 tablet by mouth in the morning.     sildenafil (VIAGRA) 100 MG tablet Take 100 mg by mouth daily  as needed for erectile dysfunction.     No current facility-administered medications for this visit.     Past Medical History:  Diagnosis Date   ATRIAL FIBRILLATION 06/16/2008   Qualifier: Diagnosis of  By: Vivian, RMA, [name]     Atrial fibrillation (HCC)    Blood in stool    Cardiomyopathy    improved    DJD (degenerative joint disease) of lumbar spine    Gout    Hepatitis C    Hx of echocardiogram    a. Echo 12/13:  EF 60-65%, mod LAE, mild RAE, PASP 34   Intrinsic asthma, unspecified    Substance abuse (HCC)    Unspecified polyarthropathy or polyarthritis, multiple sites     Past Surgical History:  Procedure Laterality Date   ATRIAL FIBRILLATION ABLATION N/A 10/16/2020   Procedure: ATRIAL FIBRILLATION ABLATION;  Surgeon: Kelsie Agent, MD;  Location: [facility] INVASIVE CV LAB;  Service: Cardiovascular;  Laterality: N/A;   CARDIOVERSION N/A 11/20/2020   Procedure: CARDIOVERSION;  Surgeon: Shlomo Wilbert SAUNDERS, MD;  Location: [facility] ENDOSCOPY;  Service: Cardiovascular;  Laterality: N/A;   CARDIOVERSION N/A 12/31/2020   Procedure: CARDIOVERSION;  Surgeon: Mona Vinie BROCKS, MD;  Location: [facility] ENDOSCOPY;  Service: Cardiovascular;  Laterality: N/A;   CARDIOVERSION N/A 09/04/2022   Procedure: CARDIOVERSION;  Surgeon: Kate Lonni CROME, MD;  Location: [facility] INVASIVE CV LAB;  Service: Cardiovascular;  Laterality: N/A;   HERNIA REPAIR  1966    Social History  Socioeconomic History   Marital status: Married    Spouse name: Not on file   Number of children: Not on file   Years of education: Not on file   Highest education level: Not on file  Occupational History   Not on file  Tobacco Use   Smoking status: Former    Current packs/day: 0.00    Types: Cigarettes    Quit date: 02/02/2014    Years since quitting: 9.7   Smokeless tobacco: Never   Tobacco comments:    started in 1999. smoked 1/4 ppd   Substance and Sexual Activity   Alcohol  use: No    Alcohol /week: 0.0 standard drinks of  alcohol     Comment: quit in 2006    Drug use: No    Comment: quit in 2006    Sexual activity: Not on file  Other Topics Concern   Not on file  Social History Narrative   Married; step children; disabled.    Social Drivers of Corporate investment banker Strain: Low Risk  (07/19/2023)   Received from [facility]   Overall Financial Resource Strain (CARDIA)    Difficulty of Paying Living Expenses: Not hard at all  Food Insecurity: No Food Insecurity (07/19/2023)   Received [facility]   Hunger Vital Sign    Within the past 12 months, you worried that your food would run out before you got the money to buy more.: Never true    Within the past 12 months, the food you bought just didn't last and you didn't have money to get more.: Never true  Transportation Needs: No Transportation Needs (07/19/2023)   Received from [facility] - Transportation    Lack of Transportation (Medical): No    Lack of Transportation (Non-Medical): No  Physical Activity: Sufficiently Active (07/19/2023)   Received from [facility]   Exercise Vital Sign    On average, how many days per week do you engage in moderate to strenuous exercise (like a brisk walk)?: 3 days    On average, how many minutes do you engage in exercise at this level?: 90 min  Stress: No Stress Concern Present (07/19/2023)   Received from [facility] - Occupational Stress Questionnaire    Feeling of Stress : Not at all  Social Connections: Patient Declined (07/19/2023)   Received from [facility]   Social Network    How would you rate your social network (family, work, friends)?: Patient declined  Intimate Partner Violence: Not At Risk (07/19/2023)   Received from [facility]   HITS    Over the last 12 months how often did your partner physically hurt you?: Never    Over the last 12 months how often did your partner insult you or talk down to you?: Never    Over the last  12 months how often did your partner threaten you with physical harm?: Never    Over the last 12 months how often did your partner scream or curse at you?: Never    Family History  Problem Relation Age of Onset   Heart failure Other        CHF - father and mother. (mother also had severe RA)    ROS: no fevers or chills, productive cough, hemoptysis, dysphasia, odynophagia, melena, hematochezia, dysuria, hematuria, rash, seizure activity, orthopnea, PND, pedal edema, claudication. Remaining systems are negative.  Physical Exam: Well-developed well-nourished in no acute distress.  Skin  is warm and dry.  HEENT is normal.  Neck is supple.  Chest is clear to auscultation with normal expansion.  Cardiovascular exam is regular rate and rhythm.  Abdominal exam nontender or distended. No masses palpated. Extremities show no edema. neuro grossly intact  ECG- personally reviewed  A/P  1 paroxysmal atrial fibrillation/flutter-patient remains in sinus rhythm.  Continue Tikosyn , metoprolol  and apixaban .  He has had occasions of recurrent atrial fibrillation.  He discussed repeat ablation with Dr. Inocencio but preferred conservative measures for now.  2 hypertension-patient's blood pressure is controlled.  Continue present medical regimen.  Redell Shallow, MD

## 2023-10-25 ENCOUNTER — Ambulatory Visit: Admitting: Cardiology

## 2023-11-03 ENCOUNTER — Telehealth: Payer: Self-pay | Admitting: Cardiology

## 2023-11-03 ENCOUNTER — Other Ambulatory Visit (HOSPITAL_COMMUNITY): Payer: Self-pay | Admitting: Internal Medicine

## 2023-11-03 ENCOUNTER — Encounter: Payer: Self-pay | Admitting: Cardiology

## 2023-11-03 MED ORDER — DOFETILIDE 500 MCG PO CAPS: 500.0000 ug | ORAL_CAPSULE | Freq: Two times a day (BID) | ORAL | 2 refills | Status: AC

## 2023-11-03 NOTE — Telephone Encounter (Signed)
*  STAT* If patient is at the pharmacy, call can be transferred to refill team.   1. Which medications need to be refilled? (please list name of each medication and dose if known)  dofetilide  (TIKOSYN ) 500 MCG capsule   2. Which pharmacy/location (including street and city if local pharmacy) is medication to be sent to? [id number] - [place], [place] - [address]   3. Do they need a 30 day or 90 day supply?  90 day supply

## 2023-11-03 NOTE — Telephone Encounter (Signed)
 Pt's medication was sent to pt's pharmacy as requested. Confirmation received.

## 2023-11-18 ENCOUNTER — Encounter: Payer: Self-pay | Admitting: Cardiology

## 2023-11-19 MED ORDER — MAGNESIUM-OXIDE 400 (240 MG) MG PO TABS: 1.0000 | ORAL_TABLET | Freq: Every day | ORAL | 0 refills | Status: AC

## 2023-12-08 ENCOUNTER — Encounter: Payer: Self-pay | Admitting: Cardiology

## 2023-12-14 ENCOUNTER — Encounter: Payer: Self-pay | Admitting: Cardiology

## 2023-12-16 LAB — COLOGUARD: COLOGUARD: NEGATIVE

## 2023-12-22 NOTE — Progress Notes (Signed)
 HPI: FU nonischemic cardiomyopathy and atrial fibrillation. Cardiomyopathy was likely tachycardia mediated. LHC 3/08: Normal coronary arteries, EF 50%. Also with history of substance abuse. Most recent echocardiogram February 2022 showed normal LV function, mild left ventricular hypertrophy, severe biatrial enlargement, mild mitral regurgitation, mild to moderate tricuspid regurgitation.  Abdominal ultrasound April 2022 showed no evidence of aneurysm.  Had atrial fibrillation ablation September 2022.  He has had recurrent atrial fibrillation following his ablation and Tikosyn  initiated.  Had cardioversion of atrial flutter September 04, 2022.  However atrial flutter recurred and he was seen by Dr. Inocencio; if symptoms worsen repeat ablation can be performed.  Since last seen, he has some dyspnea on exertion but denies orthopnea, PND, pedal edema, chest pain or syncope.  Occasional atrial fibrillation lasting between 10 minutes and several hours.  Current Outpatient Medications  Medication Sig Dispense Refill   albuterol (VENTOLIN HFA) 108 (90 Base) MCG/ACT inhaler Inhale 2 puffs into the lungs every 6 (six) hours as needed for wheezing or shortness of breath.     allopurinol (ZYLOPRIM) 100 MG tablet Take 200 mg by mouth daily.     apixaban  (ELIQUIS ) 5 MG TABS tablet Take 1 tablet by mouth twice daily 60 tablet 5   B Complex-C (SUPER B COMPLEX PO) Take 1 tablet by mouth daily.     dofetilide  (TIKOSYN ) 500 MCG capsule Take 1 capsule (500 mcg total) by mouth 2 (two) times daily. 180 capsule 2   Glycerin (CLEAR EYES ADV DRY & ITCHY RLF) 0.25 % SOLN Place 1-2 drops into both eyes 3 (three) times daily as needed (dry and itchy eyes).     hydrocortisone cream 1 % Apply 1 application topically daily as needed for itching.     MAGNESIUM -OXIDE 400 (240 Mg) MG tablet Take 1 tablet (400 mg total) by mouth daily. Please keep your follow-up appointment with Dr. Pietro in November to get future refills. 90 tablet  0   metoprolol  tartrate (LOPRESSOR ) 25 MG tablet Take 1 tablet (25 mg total) by mouth 2 (two) times daily. 180 tablet 3   Multiple Vitamin (MULTIVITAMIN WITH MINERALS) TABS tablet Take 1 tablet by mouth in the morning.     sildenafil (VIAGRA) 100 MG tablet Take 100 mg by mouth daily as needed for erectile dysfunction.     No current facility-administered medications for this visit.     Past Medical History:  Diagnosis Date   ATRIAL FIBRILLATION 06/16/2008   Qualifier: Diagnosis of  By: Vivian, RMA, [name]     Atrial fibrillation (HCC)    Blood in stool    Cardiomyopathy    improved    DJD (degenerative joint disease) of lumbar spine    Gout    Hepatitis C    Hx of echocardiogram    a. Echo 12/13:  EF 60-65%, mod LAE, mild RAE, PASP 34   Intrinsic asthma, unspecified    Substance abuse (HCC)    Unspecified polyarthropathy or polyarthritis, multiple sites     Past Surgical History:  Procedure Laterality Date   ATRIAL FIBRILLATION ABLATION N/A 10/16/2020   Procedure: ATRIAL FIBRILLATION ABLATION;  Surgeon: Kelsie Agent, MD;  Location: [facility] INVASIVE CV LAB;  Service: Cardiovascular;  Laterality: N/A;   CARDIOVERSION N/A 11/20/2020   Procedure: CARDIOVERSION;  Surgeon: Shlomo Wilbert SAUNDERS, MD;  Location: [facility] ENDOSCOPY;  Service: Cardiovascular;  Laterality: N/A;   CARDIOVERSION N/A 12/31/2020   Procedure: CARDIOVERSION;  Surgeon: Mona Vinie BROCKS, MD;  Location: [facility] ENDOSCOPY;  Service: Cardiovascular;  Laterality: N/A;   CARDIOVERSION N/A 09/04/2022   Procedure: CARDIOVERSION;  Surgeon: Kate Lonni CROME, MD;  Location: [facility] INVASIVE CV LAB;  Service: Cardiovascular;  Laterality: N/A;   HERNIA REPAIR  1966    Social History   Socioeconomic History   Marital status: Married    Spouse name: Not on file   Number of children: Not on file   Years of education: Not on file   Highest education level: Not on file  Occupational History   Not on file  Tobacco Use   Smoking status:  Former    Current packs/day: 0.00    Types: Cigarettes    Quit date: 02/02/2014    Years since quitting: 9.9   Smokeless tobacco: Never   Tobacco comments:    started in 1999. smoked 1/4 ppd   Substance and Sexual Activity   Alcohol  use: No    Alcohol /week: 0.0 standard drinks of alcohol     Comment: quit in 2006    Drug use: No    Comment: quit in 2006    Sexual activity: Not on file  Other Topics Concern   Not on file  Social History Narrative   Married; step children; disabled.    Social Drivers of Corporate Investment Banker Strain: Low Risk  (07/19/2023)   Received from [facility]   Overall Financial Resource Strain (CARDIA)    Difficulty of Paying Living Expenses: Not hard at all  Food Insecurity: No Food Insecurity (07/19/2023)   Received [facility]   Hunger Vital Sign    Within the past 12 months, you worried that your food would run out before you got the money to buy more.: Never true    Within the past 12 months, the food you bought just didn't last and you didn't have money to get more.: Never true  Transportation Needs: No Transportation Needs (07/19/2023)   Received from [facility] - Transportation    Lack of Transportation (Medical): No    Lack of Transportation (Non-Medical): No  Physical Activity: Sufficiently Active (07/19/2023)   Received from [facility]   Exercise Vital Sign    On average, how many days per week do you engage in moderate to strenuous exercise (like a brisk walk)?: 3 days    On average, how many minutes do you engage in exercise at this level?: 90 min  Stress: No Stress Concern Present (07/19/2023)   Received from [facility] - Occupational Stress Questionnaire    Feeling of Stress : Not at all  Social Connections: Patient Declined (07/19/2023)   Received from [facility]   Social Network    How would you rate your social network (family, work, friends)?: Patient  declined  Intimate Partner Violence: Not At Risk (07/19/2023)   Received from [facility]   HITS    Over the last 12 months how often did your partner physically hurt you?: Never    Over the last 12 months how often did your partner insult you or talk down to you?: Never    Over the last 12 months how often did your partner threaten you with physical harm?: Never    Over the last 12 months how often did your partner scream or curse at you?: Never    Family History  Problem Relation Age of Onset   Heart failure Other        CHF - father and mother. (mother also had  severe RA)    ROS: no fevers or chills, productive cough, hemoptysis, dysphasia, odynophagia, melena, hematochezia, dysuria, hematuria, rash, seizure activity, orthopnea, PND, pedal edema, claudication. Remaining systems are negative.  Physical Exam: Well-developed well-nourished in no acute distress.  Skin is warm and dry.  HEENT is normal.  Neck is supple.  Chest is clear to auscultation with normal expansion.  Cardiovascular exam is regular rate and rhythm.  Abdominal exam nontender or distended. No masses palpated. Extremities show no edema. neuro grossly intact  EKG Interpretation Date/Time:  Wednesday December 29 2023 15:37:21 EST Ventricular Rate:  53 PR Interval:  204 QRS Duration:  148 QT Interval:  496 QTC Calculation: 465 R Axis:   78  Text Interpretation: Sinus bradycardia Left bundle branch block Confirmed by Pietro Rogue ([id number]) on 12/29/2023 3:40:05 PM    A/P  1 paroxysmal atrial fibrillation/flutter-he is in sinus rhythm today.  Will continue present dose of Tikosyn , apixaban  and metoprolol .  He has been seen by Dr. Inocencio and ablation can be considered if he continues to have episodes.  Check hemoglobin, renal function, magnesium .  Will repeat echocardiogram as he describes some dyspnea on exertion.  2 hypertension-blood pressure controlled.  Continue present medications.  Rogue Pietro,  MD

## 2023-12-29 ENCOUNTER — Ambulatory Visit: Admitting: Cardiology

## 2023-12-29 ENCOUNTER — Encounter: Payer: Self-pay | Admitting: Cardiology

## 2023-12-29 VITALS — BP 142/82 | HR 53 | Ht 74.0 in | Wt 165.0 lb

## 2023-12-29 DIAGNOSIS — I484 Atypical atrial flutter: Secondary | ICD-10-CM

## 2023-12-29 DIAGNOSIS — I10 Essential (primary) hypertension: Secondary | ICD-10-CM | POA: Diagnosis not present

## 2023-12-29 DIAGNOSIS — I4819 Other persistent atrial fibrillation: Secondary | ICD-10-CM | POA: Diagnosis not present

## 2023-12-29 NOTE — Patient Instructions (Signed)
   Testing/Procedures:  Your physician has requested that you have an echocardiogram. Echocardiography is a painless test that uses sound waves to create images of your heart. It provides your doctor with information about the size and shape of your heart and how well your heart's chambers and valves are working. This procedure takes approximately one hour. There are no restrictions for this procedure. Please do NOT wear cologne, perfume, aftershave, or lotions (deodorant is allowed). Please arrive 15 minutes prior to your appointment time.  Please note: We ask at that you not bring children with you during ultrasound (echo/ vascular) testing. Due to room size and safety concerns, children are not allowed in the ultrasound rooms during exams. Our front office staff cannot provide observation of children in our lobby area while testing is being conducted. An adult accompanying a patient to their appointment will only be allowed in the ultrasound room at the discretion of the ultrasound technician under special circumstances. We apologize for any inconvenience. [address]  Follow-Up: At [facility], you and your health needs are our priority.  As part of our continuing mission to provide you with exceptional heart care, our providers are all part of one team.  This team includes your primary Cardiologist (physician) and Advanced Practice Providers or APPs (Physician Assistants and Nurse Practitioners) who all work together to provide you with the care you need, when you need it.  Your next appointment:   6 month(s)  Provider:   Redell Shallow, MD

## 2023-12-30 ENCOUNTER — Ambulatory Visit: Payer: Self-pay | Admitting: Cardiology

## 2023-12-30 LAB — BASIC METABOLIC PANEL WITH GFR
BUN/Creatinine Ratio: 17 (ref 10–24)
BUN: 18 mg/dL (ref 8–27)
CO2: 25 mmol/L (ref 20–29)
Calcium: 9.3 mg/dL (ref 8.6–10.2)
Chloride: 103 mmol/L (ref 96–106)
Creatinine, Ser: 1.07 mg/dL (ref 0.76–1.27)
Glucose: 95 mg/dL (ref 70–99)
Potassium: 4.8 mmol/L (ref 3.5–5.2)
Sodium: 141 mmol/L (ref 134–144)
eGFR: 76 mL/min/1.73 (ref >59)

## 2023-12-30 LAB — CBC
Hematocrit: 42.6 % (ref 37.5–51.0)
Hemoglobin: 14 g/dL (ref 13.0–17.7)
MCH: 30.2 pg (ref 26.6–33.0)
MCHC: 32.9 g/dL (ref 31.5–35.7)
MCV: 92 fL (ref 79–97)
Platelets: 223 x10E3/uL (ref 150–450)
RBC: 4.63 x10E6/uL (ref 4.14–5.80)
RDW: 13 % (ref 11.6–15.4)
WBC: 8.5 x10E3/uL (ref 3.4–10.8)

## 2023-12-30 LAB — MAGNESIUM: Magnesium: 1.9 mg/dL (ref 1.6–2.3)

## 2024-01-20 ENCOUNTER — Ambulatory Visit (HOSPITAL_COMMUNITY): Admitting: Internal Medicine

## 2024-02-11 ENCOUNTER — Ambulatory Visit (HOSPITAL_COMMUNITY)

## 2024-03-02 ENCOUNTER — Encounter: Payer: Self-pay | Admitting: Cardiology

## 2024-03-02 ENCOUNTER — Other Ambulatory Visit: Payer: Self-pay | Admitting: Cardiology

## 2024-03-02 DIAGNOSIS — I4819 Other persistent atrial fibrillation: Secondary | ICD-10-CM

## 2024-03-03 ENCOUNTER — Ambulatory Visit (HOSPITAL_COMMUNITY)
Admission: RE | Admit: 2024-03-03 | Discharge: 2024-03-03 | Disposition: A | Source: Ambulatory Visit | Attending: Cardiology | Admitting: Cardiology

## 2024-03-03 DIAGNOSIS — I4819 Other persistent atrial fibrillation: Secondary | ICD-10-CM | POA: Insufficient documentation

## 2024-03-03 LAB — ECHOCARDIOGRAM COMPLETE
Area-P 1/2: 1.83 cm2
S' Lateral: 3.3 cm

## 2024-03-05 ENCOUNTER — Encounter: Payer: Self-pay | Admitting: Cardiology
